# Patient Record
Sex: Female | Born: 1975 | Race: White | Hispanic: No | Marital: Single | Smoking: Current every day smoker
Health system: Southern US, Community
[De-identification: ages and names within clinical notes are randomized; demographics above are authoritative.]

## PROBLEM LIST (undated history)

## (undated) DIAGNOSIS — F1910 Other psychoactive substance abuse, uncomplicated: Secondary | ICD-10-CM

## (undated) HISTORY — PX: TUBAL LIGATION: SHX77

---

## 1997-10-07 ENCOUNTER — Emergency Department (HOSPITAL_COMMUNITY): Admission: EM | Admit: 1997-10-07 | Discharge: 1997-10-07 | Payer: Self-pay | Admitting: Emergency Medicine

## 1999-04-20 ENCOUNTER — Ambulatory Visit (HOSPITAL_COMMUNITY): Admission: RE | Admit: 1999-04-20 | Discharge: 1999-04-20 | Payer: Self-pay | Admitting: *Deleted

## 1999-05-03 ENCOUNTER — Emergency Department (HOSPITAL_COMMUNITY): Admission: EM | Admit: 1999-05-03 | Discharge: 1999-05-03 | Payer: Self-pay | Admitting: Emergency Medicine

## 1999-06-26 ENCOUNTER — Ambulatory Visit (HOSPITAL_COMMUNITY): Admission: RE | Admit: 1999-06-26 | Discharge: 1999-06-26 | Payer: Self-pay | Admitting: *Deleted

## 1999-07-22 ENCOUNTER — Emergency Department (HOSPITAL_COMMUNITY): Admission: EM | Admit: 1999-07-22 | Discharge: 1999-07-22 | Payer: Self-pay | Admitting: Emergency Medicine

## 1999-10-16 ENCOUNTER — Inpatient Hospital Stay (HOSPITAL_COMMUNITY): Admission: AD | Admit: 1999-10-16 | Discharge: 1999-10-16 | Payer: Self-pay | Admitting: Obstetrics & Gynecology

## 1999-11-16 ENCOUNTER — Inpatient Hospital Stay (HOSPITAL_COMMUNITY): Admission: AD | Admit: 1999-11-16 | Discharge: 1999-11-16 | Payer: Self-pay | Admitting: *Deleted

## 1999-11-21 ENCOUNTER — Inpatient Hospital Stay (HOSPITAL_COMMUNITY): Admission: AD | Admit: 1999-11-21 | Discharge: 1999-11-23 | Payer: Self-pay | Admitting: *Deleted

## 2001-04-17 ENCOUNTER — Emergency Department (HOSPITAL_COMMUNITY): Admission: EM | Admit: 2001-04-17 | Discharge: 2001-04-17 | Payer: Self-pay | Admitting: Emergency Medicine

## 2003-01-10 ENCOUNTER — Emergency Department (HOSPITAL_COMMUNITY): Admission: EM | Admit: 2003-01-10 | Discharge: 2003-01-10 | Payer: Self-pay | Admitting: Emergency Medicine

## 2003-01-10 ENCOUNTER — Encounter: Payer: Self-pay | Admitting: Emergency Medicine

## 2004-05-18 ENCOUNTER — Emergency Department: Payer: Self-pay | Admitting: Emergency Medicine

## 2006-02-04 ENCOUNTER — Emergency Department (HOSPITAL_COMMUNITY): Admission: EM | Admit: 2006-02-04 | Discharge: 2006-02-04 | Payer: Self-pay | Admitting: Emergency Medicine

## 2006-02-28 ENCOUNTER — Emergency Department (HOSPITAL_COMMUNITY): Admission: EM | Admit: 2006-02-28 | Discharge: 2006-02-28 | Payer: Self-pay | Admitting: Emergency Medicine

## 2007-04-27 ENCOUNTER — Emergency Department (HOSPITAL_COMMUNITY): Admission: EM | Admit: 2007-04-27 | Discharge: 2007-04-27 | Payer: Self-pay | Admitting: Family Medicine

## 2009-08-14 ENCOUNTER — Emergency Department (HOSPITAL_COMMUNITY): Admission: EM | Admit: 2009-08-14 | Discharge: 2009-08-14 | Payer: Self-pay | Admitting: Emergency Medicine

## 2010-03-17 ENCOUNTER — Emergency Department (HOSPITAL_COMMUNITY)
Admission: EM | Admit: 2010-03-17 | Discharge: 2010-03-17 | Payer: Self-pay | Source: Home / Self Care | Admitting: Emergency Medicine

## 2010-08-19 ENCOUNTER — Emergency Department (HOSPITAL_COMMUNITY)
Admission: EM | Admit: 2010-08-19 | Discharge: 2010-08-19 | Disposition: A | Discharge status: Home / Self Care | Payer: Self-pay | Attending: Emergency Medicine | Admitting: Emergency Medicine

## 2010-08-19 DIAGNOSIS — K089 Disorder of teeth and supporting structures, unspecified: Secondary | ICD-10-CM | POA: Insufficient documentation

## 2010-08-19 DIAGNOSIS — K047 Periapical abscess without sinus: Secondary | ICD-10-CM | POA: Insufficient documentation

## 2010-08-30 LAB — URINALYSIS, ROUTINE W REFLEX MICROSCOPIC
Bilirubin Urine: NEGATIVE
Glucose, UA: NEGATIVE mg/dL
Ketones, ur: NEGATIVE mg/dL
Nitrite: POSITIVE — AB
Protein, ur: 30 mg/dL — AB
Specific Gravity, Urine: 1.01 (ref 1.005–1.030)
Urobilinogen, UA: 0.2 mg/dL (ref 0.0–1.0)
pH: 7 (ref 5.0–8.0)

## 2010-08-30 LAB — DIFFERENTIAL
Basophils Absolute: 0 10*3/uL (ref 0.0–0.1)
Basophils Relative: 0 % (ref 0–1)
Lymphocytes Relative: 10 % — ABNORMAL LOW (ref 12–46)
Monocytes Absolute: 1.2 10*3/uL — ABNORMAL HIGH (ref 0.1–1.0)
Neutro Abs: 8.7 10*3/uL — ABNORMAL HIGH (ref 1.7–7.7)
Neutrophils Relative %: 79 % — ABNORMAL HIGH (ref 43–77)

## 2010-08-30 LAB — URINE CULTURE: Colony Count: >=100000

## 2010-08-30 LAB — POCT I-STAT, CHEM 8
BUN: 11 mg/dL (ref 6–23)
Calcium, Ion: 1.07 mmol/L — ABNORMAL LOW (ref 1.12–1.32)
Chloride: 104 mEq/L (ref 96–112)
HCT: 38 % (ref 36.0–46.0)
Potassium: 4 mEq/L (ref 3.5–5.1)
Sodium: 135 mEq/L (ref 135–145)

## 2010-08-30 LAB — URINE MICROSCOPIC-ADD ON

## 2010-08-30 LAB — CBC
Hemoglobin: 12.7 g/dL (ref 12.0–15.0)
MCHC: 32.7 g/dL (ref 30.0–36.0)
RDW: 12.6 % (ref 11.5–15.5)

## 2010-08-30 LAB — POCT PREGNANCY, URINE: Preg Test, Ur: NEGATIVE

## 2010-10-23 NOTE — Op Note (Signed)
[facility]  Patient:    Hailey Perez, Hailey Perez                       MRN: [id number] Proc. Date: 11/22/99 Adm. Date:  [id number] Disc. Date: [id number] Attending:  Michaelle Copas                           Operative Report  PREOPERATIVE DIAGNOSIS:       Desires sterilization.  POSTOPERATIVE DIAGNOSIS:      Desires sterilization.  OPERATION:                    Bilateral partial salpingectomy (Pomeroy technique).  SURGEON:                      Charles A. Clearance Coots, M.D.  ASSISTANT:  ANESTHESIA:                   General anesthesia.  ESTIMATED BLOOD LOSS:         Negligible.  COMPLICATIONS:                None.  SPECIMEN:                     Approximately 2 cm segments of right and left fallopian tubes.  DESCRIPTION OF PROCEDURE:     The patient was brought to the operating room and  after satisfactory general endotracheal anesthesia, the abdomen was prepped and  draped in the usual sterile fashion.  A small inferior umbilical incision was made with the scalpel that was deepened down to the fascia with curved Mayo scissors. The fascia was grasped in the midline with Kelly forceps and was cut transversely with curved Mayo scissors.  The incision was extended to the left and to the right with curved Mayo scissors.  The peritoneum was grasped with hemostats and was incised with Metzenbaum scissors.  The right angle retractors were placed in the incision and the right fallopian tube was identified and was grasped with a Babcock clamp.  The tube was followed from the cornual end to the fimbrial end serially in the grasp of Babcock clamps and then the tube was followed in a retrograde fashion back to the isthmic area of the tube in the Babcock clamps and a knuckle of tube beneath the Babcock clamp in the isthmic area of the tube was doubly ligated with #1 plain catgut and the section of tube above the knot was excised  with Metzenbaum scissors and submitted to pathology for evaluation.  There was no active bleeding from the tubal stumps.  The same procedure was performed on the opposite side without complications.  The abdomen was then closed as follows.  The peritoneum and fascia were closed as one with a continuous suture of 2-0 Vicryl.  The subcutaneous tissue was approximated with an interrupted suture of 2-0 Vicryl.  The skin was  closed with a continuous subcuticular suture of 4-0 Monocryl.  A sterile bandage was applied to the incision closure.  The surgical technician indicated that all sponge, needle, and instrument counts were correct.  The patient tolerated the procedure well and was transported to the recovery room in satisfactory condition. DD:  11/22/99 TD:  11/24/99 Job: [id number]

## 2011-03-16 LAB — RAPID URINE DRUG SCREEN, HOSP PERFORMED
Barbiturates: NOT DETECTED
Benzodiazepines: POSITIVE — AB
Cocaine: POSITIVE — AB
Opiates: NOT DETECTED

## 2012-11-16 ENCOUNTER — Emergency Department (HOSPITAL_COMMUNITY): Admission: EM | Admit: 2012-11-16 | Discharge: 2012-11-16 | Discharge status: Against Medical Advice | Payer: Self-pay

## 2012-11-16 NOTE — ED Notes (Signed)
Called pt to triage. No response.  

## 2012-11-16 NOTE — ED Notes (Signed)
Called pt name. No answer x 2

## 2013-12-01 ENCOUNTER — Emergency Department (HOSPITAL_COMMUNITY)
Admission: EM | Admit: 2013-12-01 | Discharge: 2013-12-01 | Disposition: A | Discharge status: Home / Self Care | Payer: Self-pay | Attending: Emergency Medicine | Admitting: Emergency Medicine

## 2013-12-01 ENCOUNTER — Encounter (HOSPITAL_COMMUNITY): Payer: Self-pay | Admitting: Emergency Medicine

## 2013-12-01 DIAGNOSIS — L0231 Cutaneous abscess of buttock: Secondary | ICD-10-CM | POA: Insufficient documentation

## 2013-12-01 DIAGNOSIS — F17200 Nicotine dependence, unspecified, uncomplicated: Secondary | ICD-10-CM | POA: Insufficient documentation

## 2013-12-01 DIAGNOSIS — L03317 Cellulitis of buttock: Principal | ICD-10-CM

## 2013-12-01 DIAGNOSIS — Z79899 Other long term (current) drug therapy: Secondary | ICD-10-CM | POA: Insufficient documentation

## 2013-12-01 MED ORDER — SULFAMETHOXAZOLE-TMP DS 800-160 MG PO TABS
1.0000 | ORAL_TABLET | Freq: Once | ORAL | Status: AC
  Administered 2013-12-01: 1 via ORAL
  Filled 2013-12-01: qty 1

## 2013-12-01 MED ORDER — CEPHALEXIN 500 MG PO CAPS: 500.0000 mg | ORAL_CAPSULE | Freq: Four times a day (QID) | ORAL | Status: DC

## 2013-12-01 MED ORDER — CEPHALEXIN 500 MG PO CAPS
500.0000 mg | ORAL_CAPSULE | Freq: Once | ORAL | Status: AC
  Administered 2013-12-01: 500 mg via ORAL
  Filled 2013-12-01: qty 1

## 2013-12-01 MED ORDER — TRAMADOL HCL 50 MG PO TABS: 50.0000 mg | ORAL_TABLET | Freq: Four times a day (QID) | ORAL | Status: DC | PRN

## 2013-12-01 MED ORDER — MORPHINE SULFATE 4 MG/ML IJ SOLN
4.0000 mg | INTRAMUSCULAR | Status: DC | PRN
  Administered 2013-12-01: 4 mg via INTRAVENOUS
  Filled 2013-12-01: qty 1

## 2013-12-01 MED ORDER — LIDOCAINE-EPINEPHRINE 2 %-1:100000 IJ SOLN
20.0000 mL | Freq: Once | INTRAMUSCULAR | Status: AC
  Administered 2013-12-01: 20 mL via INTRADERMAL
  Filled 2013-12-01: qty 1

## 2013-12-01 MED ORDER — SULFAMETHOXAZOLE-TRIMETHOPRIM 800-160 MG PO TABS: 1.0000 | ORAL_TABLET | Freq: Two times a day (BID) | ORAL | Status: DC

## 2013-12-01 NOTE — ED Provider Notes (Signed)
Medical screening examination/treatment/procedure(s) were performed by non-physician practitioner and as supervising physician I was immediately available for consultation/collaboration.   Dorie Rank, MD 12/01/13 1525

## 2013-12-01 NOTE — ED Notes (Signed)
Patient c/o abscess left buttock  X 3 days.

## 2013-12-01 NOTE — ED Provider Notes (Signed)
CSN: [id number]     Arrival date & time 12/01/13  1241 History  This chart was scribed for non-physician practitioner, Monico Blitz, PA-C,working with Dorie Rank, MD, by Marlowe Kays, ED Scribe.  This patient was seen in room WTR6/WTR6 and the patient's care was started at 2:27 PM.  Chief Complaint  Patient presents with  . Abscess   The history is provided by the patient. No language interpreter was used.   HPI Comments:  Hailey Perez is a 38 y.o. female who presents to the Emergency Department complaining of an abscess to her left buttock that she noticed three days ago. Pt reports the area started as a "pimple" and reports trying to pop it with no discharge of anything. She denies fever, chills, nausea, or vomiting. She denies h/o DM. She states she has not been seen by a doctor "in a long time". She states she is not allergic to any medications.   History reviewed. No pertinent past medical history. Past Surgical History  Procedure Laterality Date  . Tubal ligation     Family History  Problem Relation Age of Onset  . Hypertension Mother    History  Substance Use Topics  . Smoking status: Current Every Day Smoker -- 1.00 packs/day    Types: Cigarettes  . Smokeless tobacco: Never Used  . Alcohol Use: No   OB History   Grav Para Term Preterm Abortions TAB SAB Ect Mult Living                 Review of Systems A complete 10 system review of systems was obtained and all systems are negative except as noted in the HPI and PMH.   Allergies  Review of patient's allergies indicates no known allergies.  Home Medications   Prior to Admission medications   Medication Sig Start Date End Date Taking? Authorizing Loyd Salvador  Aspirin-Salicylamide-Caffeine (BC HEADACHE POWDER PO) Take 1 packet by mouth as needed (for pain).   Yes Historical Felipe Cabell, MD  cephALEXin (KEFLEX) 500 MG capsule Take 1 capsule (500 mg total) by mouth 4 (four) times daily. 12/01/13   Nicole Pisciotta, PA-C   sulfamethoxazole-trimethoprim (SEPTRA DS) 800-160 MG per tablet Take 1 tablet by mouth every 12 (twelve) hours. 12/01/13   Nicole Pisciotta, PA-C  traMADol (ULTRAM) 50 MG tablet Take 1 tablet (50 mg total) by mouth every 6 (six) hours as needed. 12/01/13   Nicole Pisciotta, PA-C   Triage Vitals: BP 105/59  Pulse 90  Temp(Src) 98.4 F (36.9 C) (Oral)  Resp 16  Ht 5' (1.524 m)  Wt 115 lb (52.164 kg)  BMI 22.46 kg/m2  SpO2 99%  LMP 11/29/2013 Physical Exam  Nursing note and vitals reviewed. Constitutional: She is oriented to person, place, and time. She appears well-developed and well-nourished.  HENT:  Head: Normocephalic and atraumatic.  Eyes: EOM are normal.  Neck: Normal range of motion.  Cardiovascular: Normal rate.   Pulmonary/Chest: Effort normal.  Musculoskeletal: Normal range of motion.  Neurological: She is alert and oriented to person, place, and time.  Skin: Skin is warm and dry.     5 cm indurated abscess with no fluctuance with central scab.  Psychiatric: She has a normal mood and affect. Her behavior is normal.    ED Course  Procedures (including critical care time) DIAGNOSTIC STUDIES: Oxygen Saturation is 99% on RA, normal by my interpretation.   COORDINATION OF CARE: 2:31 PM- Will I and D abscess and order pain medication prior. Will prescribe antibiotics.  Pt verbalizes understanding and agrees to plan.  INCISION AND DRAINAGE PROCEDURE NOTE: Patient identification was confirmed and verbal consent was obtained. This procedure was performed by Monico Blitz at 3:10 PM. Site: left buttock Sterile procedures observed Needle size: 25 G Anesthetic used (type and amt): Lidocaine 2% with Epinephrine (3 mL) Blade size: #11 Drainage: minimal amount of blood Complexity: Complex Packing used: none Site anesthetized, incision made over site, wound drained and explored loculations, rinsed with copious amounts of normal saline, wound packed with sterile gauze,  covered with dry, sterile dressing.  Pt tolerated procedure well without complications.  Instructions for care discussed verbally and pt provided with additional written instructions for homecare and f/u.   Medications  morphine 4 MG/ML injection 4 mg (4 mg Intravenous Given 12/01/13 1441)  sulfamethoxazole-trimethoprim (BACTRIM DS) 800-160 MG per tablet 1 tablet (not administered)  cephALEXin (KEFLEX) capsule 500 mg (not administered)  lidocaine-EPINEPHrine (XYLOCAINE W/EPI) 2 %-1:100000 (with pres) injection 20 mL (20 mLs Intradermal Given 12/01/13 1441)    Labs Review Labs Reviewed - No data to display  Imaging Review No results found.   EKG Interpretation None      MDM   Final diagnoses:  Abscess, gluteal, left    Filed Vitals:   12/01/13 1419  BP: 105/59  Pulse: 90  Temp: 98.4 F (36.9 C)  TempSrc: Oral  Resp: 16  Height: 5' (1.524 m)  Weight: 115 lb (52.164 kg)  SpO2: 99%    Medications  morphine 4 MG/ML injection 4 mg (4 mg Intravenous Given 12/01/13 1441)  sulfamethoxazole-trimethoprim (BACTRIM DS) 800-160 MG per tablet 1 tablet (not administered)  cephALEXin (KEFLEX) capsule 500 mg (not administered)  lidocaine-EPINEPHrine (XYLOCAINE W/EPI) 2 %-1:100000 (with pres) injection 20 mL (20 mLs Intradermal Given 12/01/13 1441)    Hailey Perez is a 38 y.o. female presenting with gluteal abscess to left buttock onset 3 days ago. No systemic signs of infection. Incision and drainage with scant bloody discharge. She will be started on antibiotics  Evaluation does not show pathology that would require ongoing emergent intervention or inpatient treatment. Pt is hemodynamically stable and mentating appropriately. Discussed findings and plan with patient/guardian, who agrees with care plan. All questions answered. Return precautions discussed and outpatient follow up given.   New Prescriptions   CEPHALEXIN (KEFLEX) 500 MG CAPSULE    Take 1 capsule (500 mg total) by  mouth 4 (four) times daily.   SULFAMETHOXAZOLE-TRIMETHOPRIM (SEPTRA DS) 800-160 MG PER TABLET    Take 1 tablet by mouth every 12 (twelve) hours.   TRAMADOL (ULTRAM) 50 MG TABLET    Take 1 tablet (50 mg total) by mouth every 6 (six) hours as needed.        I personally performed the services described in this documentation, which was scribed in my presence. The recorded information has been reviewed and is accurate.    Monico Blitz, PA-C 12/01/13 [phone]

## 2013-12-01 NOTE — ED Notes (Signed)
Per patient-A&O x4. Abscess on left buttock x3 days. No fevers, chills, vomiting or diarrhea. Drainage noted as clear. No other issues noted. In NAD.

## 2013-12-01 NOTE — Discharge Instructions (Signed)
°  Take your antibiotics as directed and to completion. You should never have any leftover antibiotics! Push fluids and stay well hydrated.   If you see signs of infection (warmth, redness, tenderness, pus, sharp increase in pain, fever, red streaking) immediately return to the emergency department.  For pain control you may take:  800mg  of ibuprofen (that is usually 4 over the counter pills)  3 times a day (take with food) and acetaminophen 975mg  (this is 3 over the counter pills) four times a day. Do not drink alcohol or combine with other medications that have acetaminophen as an ingredient (Read the labels!).  For breakthrough pain you may take Tramadol. Do not drink alcohol drive or operate heavy machinery when taking Tramadol.   Do not hesitate to return to the Emergency Department for any new, worsening or concerning symptoms.   If you do not have a primary care doctor you can establish one at the   [facility]: [place] [address] [phone]  After you establish care. Let them know you were seen in the emergency room. They must obtain records for further management.    Abscess Care After An abscess (also called a boil or furuncle) is an infected area that contains a collection of pus. Signs and symptoms of an abscess include pain, tenderness, redness, or hardness, or you may feel a moveable soft area under your skin. An abscess can occur anywhere in the body. The infection may spread to surrounding tissues causing cellulitis. A cut (incision) by the surgeon was made over your abscess and the pus was drained out. Gauze may have been packed into the space to provide a drain that will allow the cavity to heal from the inside outwards. The boil may be painful for 5 to 7 days. Most people with a boil do not have high fevers. Your abscess, if seen early, may not have localized, and may not have been lanced. If not, another appointment may be required for this  if it does not get better on its own or with medications. HOME CARE INSTRUCTIONS   Only take over-the-counter or prescription medicines for pain, discomfort, or fever as directed by your caregiver.  When you bathe, soak and then remove gauze or iodoform packs at least daily or as directed by your caregiver. You may then wash the wound gently with mild soapy water. Repack with gauze or do as your caregiver directs. SEEK IMMEDIATE MEDICAL CARE IF:   You develop increased pain, swelling, redness, drainage, or bleeding in the wound site.  You develop signs of generalized infection including muscle aches, chills, fever, or a general ill feeling.  An oral temperature above 102 F (38.9 C) develops, not controlled by medication. See your caregiver for a recheck if you develop any of the symptoms described above. If medications (antibiotics) were prescribed, take them as directed. Document Released: 12/10/2004 Document Revised: 08/16/2011 Document Reviewed: 08/07/2007 [facility] Patient Information 2015 [place], [place]. This information is not intended to replace advice given to you by your health care provider. Make sure you discuss any questions you have with your health care provider.

## 2014-03-25 ENCOUNTER — Encounter (HOSPITAL_COMMUNITY): Payer: Self-pay | Admitting: Emergency Medicine

## 2014-03-25 ENCOUNTER — Emergency Department (HOSPITAL_COMMUNITY)
Admission: EM | Admit: 2014-03-25 | Discharge: 2014-03-26 | Disposition: A | Discharge status: Home / Self Care | Payer: Self-pay | Attending: Emergency Medicine | Admitting: Emergency Medicine

## 2014-03-25 DIAGNOSIS — F32A Depression, unspecified: Secondary | ICD-10-CM

## 2014-03-25 DIAGNOSIS — F1410 Cocaine abuse, uncomplicated: Secondary | ICD-10-CM | POA: Insufficient documentation

## 2014-03-25 DIAGNOSIS — F1110 Opioid abuse, uncomplicated: Secondary | ICD-10-CM | POA: Insufficient documentation

## 2014-03-25 DIAGNOSIS — F1910 Other psychoactive substance abuse, uncomplicated: Secondary | ICD-10-CM

## 2014-03-25 DIAGNOSIS — F329 Major depressive disorder, single episode, unspecified: Secondary | ICD-10-CM | POA: Insufficient documentation

## 2014-03-25 DIAGNOSIS — F1310 Sedative, hypnotic or anxiolytic abuse, uncomplicated: Secondary | ICD-10-CM | POA: Insufficient documentation

## 2014-03-25 DIAGNOSIS — F1210 Cannabis abuse, uncomplicated: Secondary | ICD-10-CM | POA: Insufficient documentation

## 2014-03-25 DIAGNOSIS — Z720 Tobacco use: Secondary | ICD-10-CM | POA: Insufficient documentation

## 2014-03-25 DIAGNOSIS — Z3202 Encounter for pregnancy test, result negative: Secondary | ICD-10-CM | POA: Insufficient documentation

## 2014-03-25 HISTORY — DX: Other psychoactive substance abuse, uncomplicated: F19.10

## 2014-03-25 NOTE — ED Notes (Signed)
Patient asking to go out to smoke, informed her that I needed her to stay in sub waiting room. Informed her if she left, and she was called , that she could be discharged, left before being seen.

## 2014-03-25 NOTE — ED Notes (Signed)
Have an exame room for patient, patient not in sub-waiting room.

## 2014-03-25 NOTE — ED Notes (Signed)
Moved another patient to fast track, when I returned to sub waiting room, patient had left.

## 2014-03-25 NOTE — ED Notes (Signed)
PT requesting an inpt detox center.

## 2014-03-25 NOTE — ED Notes (Signed)
Patient just returned to sub-waiting room.

## 2014-03-25 NOTE — ED Notes (Addendum)
Pt requesting detox from heroin, crack, cocaine. Last used heroin three hours ago. Pt is arguing with boyfriend in triage. Pt denies SI/HI

## 2014-03-25 NOTE — ED Provider Notes (Signed)
CSN: [id number]     Arrival date & time 03/25/14  2121 History   First MD Initiated Contact with Patient 03/25/14 2325     Chief Complaint  Patient presents with  . Addiction Problem     (Consider location/radiation/quality/duration/timing/severity/associated sxs/prior Treatment) The history is provided by the patient, a relative and a friend. No language interpreter was used.  KALIOPI Perez is a 66 rolled female with past medical history of substance abuse presenting to the ED requesting detox. 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 Reported that she would like detox from heroine. Stated that she's been using heroin for approximately one year, reported that she normally uses 2 times per day-reported that her last use was at 5:00 PM this afternoon. Patient reported that she is also addicted to cocaine, snorts cocaine. Stated that she last used cocaine this morning. Patient reported that she's been having increased depression with decreased sleep. Stated that she has not slept in approximately 2 days. Reported occasional marijuana use. Denied alcohol use. Denied chest pain, shortness of breath, difficulty breathing, cough, nausea, vomiting, diarrhea, fever, chills, suicidal ideation, homicidal ideation, changes eating habits, auditory visual hallucinations. PCP none  Past Medical History  Diagnosis Date  . Substance abuse    Past Surgical History  Procedure Laterality Date  . Tubal ligation    . Tubal ligation     Family History  Problem Relation Age of Onset  . Hypertension Mother    History  Substance Use Topics  . Smoking status: Current Every Day Smoker -- 1.00 packs/day    Types: Cigarettes  . Smokeless tobacco: Never Used  . Alcohol Use: No   OB History   Grav Para Term Preterm Abortions TAB SAB Ect Mult Living                 Review of Systems  Constitutional: Negative for fever and chills.  Respiratory: Negative for chest tightness and shortness  of breath.   Cardiovascular: Negative for chest pain.  Gastrointestinal: Negative for nausea, vomiting, abdominal pain and diarrhea.  Musculoskeletal: Negative for back pain and neck pain.  Neurological: Negative for dizziness, weakness and headaches.  Psychiatric/Behavioral: Positive for dysphoric mood.      Allergies  Review of patient's allergies indicates no known allergies.  Home Medications   Prior to Admission medications   Medication Sig Start Date End Date Taking? Authorizing Provider  Aspirin-Salicylamide-Caffeine (BC HEADACHE POWDER PO) Take 1 packet by mouth as needed (for pain).   Yes Historical Provider, MD   BP 118/76  Pulse 78  Temp(Src) 98.1 F (36.7 C) (Oral)  Resp 17  Ht 5' (1.524 m)  Wt 90 lb (40.824 kg)  BMI 17.58 kg/m2  SpO2 100%  LMP 02/06/2014 Physical Exam  Nursing note and vitals reviewed. Constitutional: She is oriented to person, place, and time. She appears well-developed and well-nourished.  Patient extremely lethargic. Patient cannot keep eyes open, continuously needing to yell patient's name or shake patient's leg to wake her up.  HENT:  Head: Normocephalic and atraumatic.  Mouth/Throat: Oropharynx is clear and moist. No oropharyngeal exudate.  Eyes: Conjunctivae and EOM are normal. Pupils are equal, round, and reactive to light. Right eye exhibits no discharge. Left eye exhibits no discharge.  Neck: Normal range of motion. Neck supple. No tracheal deviation present.  Cardiovascular: Normal rate, regular rhythm and normal heart sounds.   Pulses:      Radial pulses are 2+ on the right side, and  2+ on the left side.       Dorsalis pedis pulses are 2+ on the right side, and 2+ on the left side.  Pulmonary/Chest: Effort normal and breath sounds normal. No respiratory distress. She has no wheezes. She has no rales.  Airway intact Patient breathing without any respiratory distress  Abdominal: Soft. Bowel sounds are normal. She exhibits no  distension. There is no tenderness. There is no rebound and no guarding.  Negative abdominal distention Bowel sounds normoactive in all 4 quadrants Abdomen soft upon palpation Negative peritoneal signs Negative rigidity or guarding noted on exam  Musculoskeletal: Normal range of motion. She exhibits no edema and no tenderness.  Lymphadenopathy:    She has no cervical adenopathy.  Neurological: She is alert and oriented to person, place, and time. No cranial nerve deficit. She exhibits normal muscle tone. Coordination normal.  Patient continues to fall sleep during interview-patient easily awoken by calling her name or shaking her leg Patient follows commands Patient response to questions appropriately when awake  Skin: Skin is warm. No rash noted. No erythema.    ED Course  Procedures (including critical care time)  Results for orders placed during the hospital encounter of 03/25/14  CBC WITH DIFFERENTIAL      Result Value Ref Range   WBC 6.2  4.0 - 10.5 K/uL   RBC 4.40  3.87 - 5.11 MIL/uL   Hemoglobin 12.8  12.0 - 15.0 g/dL   HCT 38.1  36.0 - 46.0 %   MCV 86.6  78.0 - 100.0 fL   MCH 29.1  26.0 - 34.0 pg   MCHC 33.6  30.0 - 36.0 g/dL   RDW 12.9  11.5 - 15.5 %   Platelets 216  150 - 400 K/uL   Neutrophils Relative % 44  43 - 77 %   Neutro Abs 2.8  1.7 - 7.7 K/uL   Lymphocytes Relative 43  12 - 46 %   Lymphs Abs 2.7  0.7 - 4.0 K/uL   Monocytes Relative 10  3 - 12 %   Monocytes Absolute 0.6  0.1 - 1.0 K/uL   Eosinophils Relative 2  0 - 5 %   Eosinophils Absolute 0.1  0.0 - 0.7 K/uL   Basophils Relative 1  0 - 1 %   Basophils Absolute 0.0  0.0 - 0.1 K/uL  COMPREHENSIVE METABOLIC PANEL      Result Value Ref Range   Sodium 138  137 - 147 mEq/L   Potassium 3.8  3.7 - 5.3 mEq/L   Chloride 101  96 - 112 mEq/L   CO2 24  19 - 32 mEq/L   Glucose, Bld 103 (*) 70 - 99 mg/dL   BUN 16  6 - 23 mg/dL   Creatinine, Ser 0.73  0.50 - 1.10 mg/dL   Calcium 9.0  8.4 - 10.5 mg/dL   Total  Protein 6.9  6.0 - 8.3 g/dL   Albumin 3.3 (*) 3.5 - 5.2 g/dL   AST 119 (*) 0 - 37 U/L   ALT 202 (*) 0 - 35 U/L   Alkaline Phosphatase 96  39 - 117 U/L   Total Bilirubin <0.2 (*) 0.3 - 1.2 mg/dL   GFR calc non Af Amer >90  >90 mL/min   GFR calc Af Amer >90  >90 mL/min   Anion gap 13  5 - 15  ACETAMINOPHEN LEVEL      Result Value Ref Range   Acetaminophen (Tylenol), Serum <15.0  10 - 30  ug/mL  SALICYLATE LEVEL      Result Value Ref Range   Salicylate Lvl <4.0 (*) 2.8 - 20.0 mg/dL  ETHANOL      Result Value Ref Range   Alcohol, Ethyl (B) <11  0 - 11 mg/dL  TROPONIN I      Result Value Ref Range   Troponin I <0.30  <0.30 ng/mL  I-STAT ARTERIAL BLOOD GAS, ED      Result Value Ref Range   pH, Arterial 7.366  7.350 - 7.450   pCO2 arterial 46.3 (*) 35.0 - 45.0 mmHg   pO2, Arterial 85.0  80.0 - 100.0 mmHg   Bicarbonate 26.5 (*) 20.0 - 24.0 mEq/L   TCO2 28  0 - 100 mmol/L   O2 Saturation 96.0     Acid-Base Excess 1.0  0.0 - 2.0 mmol/L   Patient temperature 98.6 F     Collection site RADIAL, ALLEN'S TEST ACCEPTABLE     Drawn by RT     Sample type ARTERIAL      Labs Review Labs Reviewed  COMPREHENSIVE METABOLIC PANEL - Abnormal; Notable for the following:    Glucose, Bld 103 (*)    Albumin 3.3 (*)    AST 119 (*)    ALT 202 (*)    Total Bilirubin <0.2 (*)    All other components within normal limits  SALICYLATE LEVEL - Abnormal; Notable for the following:    Salicylate Lvl <9.7 (*)    All other components within normal limits  I-STAT ARTERIAL BLOOD GAS, ED - Abnormal; Notable for the following:    pCO2 arterial 46.3 (*)    Bicarbonate 26.5 (*)    All other components within normal limits  CBC WITH DIFFERENTIAL  ACETAMINOPHEN LEVEL  ETHANOL  TROPONIN I  URINE RAPID DRUG SCREEN (HOSP PERFORMED)  BLOOD GAS, ARTERIAL  POC URINE PREG, ED    Imaging Review No results found.   EKG Interpretation None     12:16 AM This provider went to reassess the patient. Mother and  boyfriend at bedside. Discussed outpatient detox. Mother became concerned, reported that patient is been having increased depression and has been voicing suicidal ideation. Reported that she attempted suicide approximately 2 months ago by cutting her hands. Boyfriend is concerned secondary to patient continuously using medications and believes that she will overdose one day. Mother and boyfriend extremely concerned. Mother reported the patient is been voicing that she's been having increased depression the past couple of days. Noticed changes to behavior.  12:54 AM Spoke with Dr. Suzie Portela regarding case transfer of care to Dr. Suzie Portela at change in shift.   MDM   Final diagnoses:  Depression  Suicidal ideation  Heroin abuse  Cocaine abuse  Polysubstance abuse    Medications  ondansetron (ZOFRAN) tablet 4 mg (not administered)   Filed Vitals:   03/25/14 2142 03/26/14 0053  BP: 118/76   Pulse: 85 78  Temp: 98.1 F (36.7 C)   TempSrc: Oral   Resp: 16 17  Height: 5' (1.524 m)   Weight: 90 lb (40.824 kg)   SpO2: 100%     Initially patient was presenting to the ED with detox from cocaine and heroin, but when family was at bedside reported that patient has been having increased depression and voicing suicidal ideation. Family extremely concerned. Patient appears extremely lethargic-cannot keep eyes open during exam. Patient is slurring speech. Stated that she recently used heroin and cocaine prior to arrival to the ED. EKG normal  sinus rhythm with a heart rate of 76 beats per minute. Troponin negative elevation. CBC unremarkable. CMP noted elevated AST of 119, ALT of 202. Negative elevated bilirubin. Glucose 103. Negative elevated anion gap of 13.0 mEq per liter. Ethanol, acetaminophen, salicylate level negative elevation. ABG noted elevated the CO2 of 46.3 and elevated bicarbonate of 26.5. Psych holding placed. Discussed case in great detail with Dr. Suzie Portela. Patient to stay in the ED until  sobers up and to be re-assessed in the morning.   Jamse Mead, PA-C 03/26/14 [phone]

## 2014-03-26 LAB — CBC WITH DIFFERENTIAL/PLATELET
BASOS PCT: 1 % (ref 0–1)
Basophils Absolute: 0 10*3/uL (ref 0.0–0.1)
Eosinophils Absolute: 0.1 10*3/uL (ref 0.0–0.7)
Eosinophils Relative: 2 % (ref 0–5)
HCT: 38.1 % (ref 36.0–46.0)
HEMOGLOBIN: 12.8 g/dL (ref 12.0–15.0)
LYMPHS ABS: 2.7 10*3/uL (ref 0.7–4.0)
Lymphocytes Relative: 43 % (ref 12–46)
MCH: 29.1 pg (ref 26.0–34.0)
MCHC: 33.6 g/dL (ref 30.0–36.0)
MCV: 86.6 fL (ref 78.0–100.0)
MONOS PCT: 10 % (ref 3–12)
Monocytes Absolute: 0.6 10*3/uL (ref 0.1–1.0)
NEUTROS ABS: 2.8 10*3/uL (ref 1.7–7.7)
NEUTROS PCT: 44 % (ref 43–77)
Platelets: 216 10*3/uL (ref 150–400)
RBC: 4.4 MIL/uL (ref 3.87–5.11)
RDW: 12.9 % (ref 11.5–15.5)
WBC: 6.2 10*3/uL (ref 4.0–10.5)

## 2014-03-26 LAB — RAPID URINE DRUG SCREEN, HOSP PERFORMED
AMPHETAMINES: NOT DETECTED
BENZODIAZEPINES: POSITIVE — AB
Barbiturates: NOT DETECTED
COCAINE: POSITIVE — AB
OPIATES: POSITIVE — AB
Tetrahydrocannabinol: POSITIVE — AB

## 2014-03-26 LAB — COMPREHENSIVE METABOLIC PANEL
ALBUMIN: 3.3 g/dL — AB (ref 3.5–5.2)
ALK PHOS: 96 U/L (ref 39–117)
ALT: 202 U/L — AB (ref 0–35)
ANION GAP: 13 (ref 5–15)
AST: 119 U/L — ABNORMAL HIGH (ref 0–37)
BUN: 16 mg/dL (ref 6–23)
CHLORIDE: 101 meq/L (ref 96–112)
CO2: 24 mEq/L (ref 19–32)
Calcium: 9 mg/dL (ref 8.4–10.5)
Creatinine, Ser: 0.73 mg/dL (ref 0.50–1.10)
GFR calc Af Amer: >90 mL/min (ref >90)
GFR calc non Af Amer: >90 mL/min (ref >90)
Glucose, Bld: 103 mg/dL — ABNORMAL HIGH (ref 70–99)
POTASSIUM: 3.8 meq/L (ref 3.7–5.3)
Sodium: 138 mEq/L (ref 137–147)
Total Protein: 6.9 g/dL (ref 6.0–8.3)

## 2014-03-26 LAB — I-STAT ARTERIAL BLOOD GAS, ED
Acid-Base Excess: 1 mmol/L (ref 0.0–2.0)
Bicarbonate: 26.5 mEq/L — ABNORMAL HIGH (ref 20.0–24.0)
O2 Saturation: 96 %
TCO2: 28 mmol/L (ref 0–100)
pCO2 arterial: 46.3 mmHg — ABNORMAL HIGH (ref 35.0–45.0)
pH, Arterial: 7.366 (ref 7.350–7.450)
pO2, Arterial: 85 mmHg (ref 80.0–100.0)

## 2014-03-26 LAB — TROPONIN I
Troponin I: <0.3 ng/mL (ref <0.30)
Troponin I: <0.3 ng/mL (ref <0.30)

## 2014-03-26 LAB — ETHANOL: Alcohol, Ethyl (B): <11 mg/dL (ref 0–11)

## 2014-03-26 LAB — ACETAMINOPHEN LEVEL: Acetaminophen (Tylenol), Serum: <15 ug/mL (ref 10–30)

## 2014-03-26 LAB — POC URINE PREG, ED: PREG TEST UR: NEGATIVE

## 2014-03-26 LAB — SALICYLATE LEVEL: Salicylate Lvl: <2 mg/dL — ABNORMAL LOW (ref 2.8–20.0)

## 2014-03-26 MED ORDER — ONDANSETRON HCL 4 MG PO TABS
4.0000 mg | ORAL_TABLET | Freq: Three times a day (TID) | ORAL | Status: DC | PRN
Start: 2014-03-26 — End: 2014-03-26

## 2014-03-26 MED ORDER — IBUPROFEN 400 MG PO TABS: 400.0000 mg | ORAL_TABLET | Freq: Four times a day (QID) | ORAL | Status: DC | PRN

## 2014-03-26 MED ORDER — ACETAMINOPHEN 325 MG PO TABS: 650.0000 mg | ORAL_TABLET | Freq: Four times a day (QID) | ORAL | Status: DC | PRN

## 2014-03-26 MED ORDER — PROMETHAZINE HCL 25 MG PO TABS: 25.0000 mg | ORAL_TABLET | Freq: Four times a day (QID) | ORAL | Status: DC | PRN

## 2014-03-26 MED ORDER — PROMETHAZINE HCL 25 MG RE SUPP: 25.0000 mg | Freq: Four times a day (QID) | RECTAL | Status: DC | PRN

## 2014-03-26 NOTE — ED Notes (Addendum)
Staffing office called - states they will send sitter. ED tech sitting with pt in meantime.

## 2014-03-26 NOTE — ED Notes (Signed)
Pt remains monitored by blood pressure, pulse ox, and 5 lead. Dr. Kathrynn Humble at bedside.

## 2014-03-26 NOTE — ED Notes (Signed)
Per [place] Pt now reports being SI and depressed. Family in room with PT.
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
## 2014-03-26 NOTE — Discharge Instructions (Signed)
We saw you in the ER for your mental health concerns and had our behavioral health team evaluate you. The team feels comfortable sending you home, please follow the recommendations given to you by them and the follow-up and medications they have prescribed to you. Please refrain from substance abuse. Return to the ER if your symptoms worsen.    Emergency Department Resource Guide 1) Find a Doctor and Pay Out of Pocket Although you won't have to find out who is covered by your insurance plan, it is a good idea to ask around and get recommendations. You will then need to call the office and see if the doctor you have chosen will accept you as a new patient and what types of options they offer for patients who are self-pay. Some doctors offer discounts or will set up payment plans for their patients who do not have insurance, but you will need to ask so you aren't surprised when you get to your appointment.  2) Contact Your Local Health Department Not all health departments have doctors that can see patients for sick visits, but many do, so it is worth a call to see if yours does. If you don't know where your local health department is, you can check in your phone book. The CDC also has a tool to help you locate your state's health department, and many state websites also have listings of all of their local health departments.  3) Find a [facility] If your illness is not likely to be very severe or complicated, you may want to try a walk in clinic. These are popping up all over the country in pharmacies, drugstores, and shopping centers. They're usually staffed by nurse practitioners or physician assistants that have been trained to treat common illnesses and complaints. They're usually fairly quick and inexpensive. However, if you have serious medical issues or chronic medical problems, these are probably not your best option.  No Primary Care Doctor: - Call Health Connect at  [phone] - they can  help you locate a primary care doctor that  accepts your insurance, provides certain services, etc. - Physician Referral Service- [phone]  Chronic Pain Problems: Organization         Address  Phone   Notes  [facility]  [phone] Patients need to be referred by their primary care doctor.   Medication Assistance: Organization         Address  Phone   Notes  [facility] Medication [facility] [address] [place], [place] [address] [phone] --Must be a resident of [facility] -- Must have NO insurance coverage whatsoever (no Medicaid/ Medicare, etc.) -- The pt. MUST have a primary care doctor that directs their care regularly and follows them in the community   MedAssist  [phone]   [employer]  [phone]    Agencies that provide inexpensive medical care: Organization         Address  Phone   Notes  [place]  [phone]   [facility]    [phone]   [facility] [place], [place] [address] [phone]   [place] [address], [place] [phone]   Planned Parenthood    [phone]   [facility]    [phone]   [facility], [place] Phone:  [phone],  Fax:  [phone] Hours of Operation:  9 am - 6 pm, M-F.  Also accepts Medicaid/Medicare and self-pay.  [facility] for [place], [address], [place] Phone: [phone], Fax: [phone]. Hours of Operation:  8:30 am - 5:30 pm, M-F.  Also accepts Medicaid and self-pay.  [facility] [place] [address], [place] Phone: [phone]   [place], [place], [place] [phone], Ext. [phone] Mondays & Thursdays: 7-9 AM.  First 15 patients are seen on a first come, first serve basis.    [place] Providers:  Organization         Address  Phone   Notes  [facility] [address], [place] [phone] Also accepts self-pay patients.  [facility] [address], [place]  [phone]   [place], [address], [place] [phone]   [facility] [address], [place] [phone]   Lucianne Lei [address], [place]   [phone] Only accepts [place] Access [place] patients after they have their name applied to their card.   Self-Pay (no insurance) in [facility]:  Organization         Address  Phone   Notes  Sickle Cell Patients, [facility] [place] [phone]   [facility] [place] [phone]   [facility] [place]  [place], [place], [place] [phone]   [facility]/Dr. Osei-Bonsu  [address], [place] or [address], [place] [phone] Phone number for both [place] and [place] locations is the same.  [facility] and [facility] [address], [place] [phone]   [facility] [address] [place] or [address] [phone]   [facility] [address] [place] [phone], phone; [phone], fax Sees patients 1st and 3rd Saturday of every month.  Must not qualify for public or private insurance (i.e. Medicaid, Medicare, [place] Health Choice, Veterans' Benefits)  Household income should be no more than 200% of the poverty level The clinic cannot treat you if you are pregnant or think you are pregnant  Sexually transmitted diseases are not treated at the clinic.    Dental Care: Organization         Address  Phone  Notes  [facility] Department of [facility] [place] [phone] Accepts children up to age 18 who are enrolled in [place] or [place]; pregnant women with a Medicaid card; and children who have applied for Medicaid or [place] Health Choice, but were declined, whose parents can pay a reduced fee at time of service.  [facility] Department of [facility]  [address], [place] [phone] Accepts children up to age 37 who are enrolled in [place] or [place]; pregnant women with a Medicaid card; and children who have applied for Medicaid or [place] Health Choice, but were declined, whose parents can pay a reduced fee at time of service.  [place] Adult Dental Access PROGRAM  [place] [phone] Patients are seen by appointment only. Walk-ins are not  accepted. [place] will see patients 45 years of age and older. Monday - Tuesday (8am-5pm) Most Wednesdays (8:30-5pm) $30 per visit, cash only  [facility] Adult Dental Access PROGRAM  [address], [facility] [phone] Patients are seen by appointment only. Walk-ins are not accepted. [place] will see patients 47 years of age and older. One Wednesday Evening (Monthly: Volunteer Based).  $30 per visit, cash only  [place]  [phone] for adults; Children under age 35, call Graduate Pediatric Dentistry at [phone]. Children aged 73-14, please call [phone] to request a pediatric application.  Dental services are provided in all areas of dental care including fillings, crowns and bridges, complete and partial dentures, implants, gum treatment, root canals, and extractions. Preventive care is also provided. Treatment is provided to both adults and children. Patients are selected via a lottery and there is often a waiting list.   [facility] [address] [place]  [phone] [email or web address]   [facility] [address] [place], [place] [phone], Ext. [phone] Second and Fourth Thursday of each month, opens at 6:30 AM; Clinic ends at 9 AM.  Patients are seen on a first-come first-served basis, and a limited number are seen during each clinic.   [facility]  [address] Hillard Danker [place], [place] [phone]   Eligibility Requirements You must have lived in [place], [place], or [place] counties for at least the last three months.   You cannot be eligible for state or federal sponsored [employer], including [employer], [place], or [employer].   You generally cannot be eligible for healthcare insurance through your employer.    How to apply: Eligibility screenings are held every Tuesday and Wednesday afternoon from 1:00 pm until 4:00 pm. You do not need an appointment for the interview!  [facility] [address], [place], [place]   [place]  [place] Department  [place]  [phone]    Behavioral Health Resources in the Community: Intensive Outpatient Programs Organization         Address  Phone  Notes  [place]. [address], [place], [place] [phone]   [facility] Outpatient [address] [place] [address] [place], [place]   ADS: Alcohol & Drug Svcs [address] [place], [place]   [place] [address]  [place], [place] or [phone]   Substance Abuse Resources Organization         Address  Phone  Notes  Alcohol and Drug Services  [phone]   [place]  [phone]   The [place]   Chinita Pester  [phone]   Residential & Outpatient Substance Abuse Program  [phone]   Psychological Services Organization         Address  Phone  Notes  [facility] [address] [place]  [place]  [phone]   [place] [address], [place] or [phone]    Mobile Crisis Teams Organization         Address  Phone  Notes  [facility], Mobile Crisis Care Unit  [phone]   [facility]  [address] [place], [place]   [facility] [address] [place] [phone]    Self-Help/Support Groups  Organization         Address  Phone             Notes  [facility] of [place] - variety of support groups  [place] Call for more information  Narcotics Anonymous (NA), [facility] [address], [employer] [place]  2 meetings at this location   Special educational needs teacher         Address  Phone  Notes  [facility] Residential Treatment [place],    [place]  [phone]   [facility]  [address] [place] [phone], [place], [place]   [place], [place] [phone] Admissions: 8am-3pm M-F  Incentives Substance [place] [address],    [place], [place] [phone]   [facility] [address] [place], [place], [place]   [facility] [address]  [place], [place]   [facility] Programs - Intensive Outpatient [address] Kristeen [address], [place], [place]   [facility] ([place] [place].,  [place], [place] [phone] or [phone]   [facility] Services (RTS) [address], [place], [place] Accepts Medicaid  [facility] [place] [address],  [place] [phone] Substance Abuse/Addiction Treatment   [facility] Organization         Address  Phone  Notes  [facility]  [phone]   Domenic Schwab, PhD [address] Arlis [place], [place]   [phone] or  [phone]   Marinette Levasy [place], [place] [phone]   [facility] [address], [place], [place] [phone] Insurance/Medicaid/sponsorship through [facility] and Families [address], [address] [place]                                    [place], [place] [phone] [place] [address], [place] [phone]    Dr. Adele Schilder  [phone]   [facility] Dept. 1) [address] [place] 2) [place] 3)  [place] [address], [place] [phone]  [phone]   [place] [phone] or [phone] (After Hours)       Polysubstance Abuse When people abuse more than one drug or type of drug it is called polysubstance or polydrug abuse. For example, many smokers also drink alcohol. This is one form of polydrug abuse. Polydrug abuse also refers to the use of a drug to counteract an unpleasant effect produced by another drug. It may also be used to help with withdrawal from another drug. People who take stimulants may become agitated. Sometimes this agitation is countered with a tranquilizer. This helps protect against the unpleasant side effects. Polydrug abuse also refers to the use of different drugs at the same time.  Anytime drug use is interfering with normal living activities, it has become abuse. This includes problems with family and friends. Psychological dependence has developed when your mind tells you that the drug is needed. This is usually followed by physical dependence which has developed when continuing increases of drug are required to get the same feeling or "high". This is known as addiction or chemical dependency.  A person's risk is much higher if there is a history of chemical dependency in the family. SIGNS OF CHEMICAL DEPENDENCY  You have been told by friends or family  that drugs have become a problem.  You fight when using drugs.  You are having blackouts (not remembering what you do while using).  You feel sick from using drugs but continue using.  You lie about use or amounts of drugs (chemicals) used.  You need chemicals to get you going.  You are suffering in work performance or in school because of drug use.  You get sick from use of drugs but continue to use anyway.  You need drugs to relate to people or feel comfortable in social situations.  You use drugs to forget problems. "Yes" answered to any of the above signs of chemical dependency indicates there are problems. The longer the use of drugs continues, the greater the problems will become. If there is a family history of drug or alcohol use, it is best not to experiment with these drugs. Continual use leads to tolerance. After tolerance develops more of the drug is needed to get the same feeling. This is followed by addiction. With addiction, drugs become the most important part of life. It becomes more important to take drugs than participate in the other usual activities of life. This includes relating to friends and family. Addiction is followed by dependency. Dependency is a condition where drugs are now needed not just to get high, but to feel normal. Addiction cannot be cured but it can be stopped. This often requires outside help and the care of professionals. Treatment centers are listed in the yellow pages under: Cocaine, Narcotics, and Alcoholics Anonymous. Most hospitals and clinics can refer you to a specialized care center. Talk to your caregiver if you need help. Document Released: 01/13/2005 Document Revised: 08/16/2011 Document Reviewed: 05/24/2005 [facility] Patient Information 2015 [place], [place]. This information is not intended to replace advice given to you by your health care provider. Make sure you discuss any questions you have with your health care provider.  Opioid  Withdrawal Opioids are a group of narcotic drugs. They include the street drug heroin. They also include pain medicines, such as morphine, hydrocodone, oxycodone, and fentanyl. Opioid withdrawal is a group of characteristic physical and mental signs and symptoms. It typically occurs if you have been using opioids daily for several weeks or longer and stop using or rapidly decrease use. Opioid withdrawal can also occur if you have used opioids daily for a long time and are given a medicine to block the effect.  SIGNS AND SYMPTOMS Opioid withdrawal includes three or more of the following symptoms:   Depressed, anxious, or irritable mood.  Nausea or vomiting.  Muscle aches or spasms.   Watery eyes.   Runny nose.  Dilated pupils, sweating, or hairs standing on end.  Diarrhea or intestinal cramping.  Yawning.   Fever.  Increased blood pressure.  Fast pulse.  Restlessness or trouble sleeping. These signs and symptoms occur within several hours of stopping or reducing short-acting opioids, such as heroin. They can occur within 3 days of stopping or reducing long-acting opioids, such as methadone. Withdrawal begins within minutes of receiving a drug that blocks the effects of opioids, such as naltrexone or naloxone. DIAGNOSIS  Opioid use disorder is diagnosed by your health care provider. You will be asked about your symptoms, drug and alcohol use, medical history, and use of medicines. A physical exam may be done.  Lab tests may be ordered. Your health care provider may have you see a mental health professional.  TREATMENT  The treatment for opioid withdrawal is usually provided by medical doctors with special training in substance use disorders (addiction specialists). The following medicines may be included in treatment:  Opioids given in place of the abused opioid. They turn on opioid receptors in the brain and lessen or prevent withdrawal symptoms. They are gradually decreased  (opioid substitution and taper).  Non-opioids that can lessen certain opioid withdrawal symptoms. They may be used alone or with opioid substitution and taper. Successful long-term recovery usually requires medicine, counseling, and group support. HOME CARE INSTRUCTIONS   Take medicines only as directed by your health care provider.  Check with your health care provider before starting new medicines.  Keep all follow-up visits as directed by your health care provider. SEEK MEDICAL CARE IF:  You are not able to take your medicines as directed.  Your symptoms get worse.  You relapse. SEEK IMMEDIATE MEDICAL CARE IF:  You have serious thoughts about hurting yourself or others.  You have a seizure.  You lose consciousness. Document Released: 05/27/2003 Document Revised: 10/08/2013 Document Reviewed: 06/06/2013 [facility] Patient Information 2015 [place], [place]. This information is not intended to replace advice given to you by your health care provider. Make sure you discuss any questions you have with your health care provider.

## 2014-03-26 NOTE — ED Notes (Signed)
Family informed of visiting hours and given visiting hour sheet.
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
## 2014-03-26 NOTE — Discharge Planning (Signed)
[facility] Specialist  Spoke to patient regarding primary care resources and establishing care with a provider. Resource guide and my contact information provided for any future questions or concerns.
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
## 2014-03-26 NOTE — ED Provider Notes (Signed)
Medical screening examination/treatment/procedure(s) were performed by non-physician practitioner and as supervising physician I was immediately available for consultation/collaboration.   EKG Interpretation   Date/Time:  Tuesday March 26 2014 00:53:24 EDT Ventricular Rate:  76 PR Interval:  133 QRS Duration: 68 QT Interval:  421 QTC Calculation: 473 R Axis:   72 Text Interpretation:  Sinus rhythm RSR' in V1 or V2, probably normal  variant Consider left ventricular hypertrophy Confirmed by Kathrynn Humble, MD,  Thelma Comp [phone]) on 03/26/2014 7:15:46 AM       Kalman Drape, MD 03/26/14 [id number]

## 2014-03-26 NOTE — ED Notes (Signed)
telepsych being completed at this time. Pt also has meal tray.

## 2014-03-26 NOTE — ED Notes (Signed)
Gave pt blue paper scrubs since mom took pt belongings home. Pt waiting for ride in waiting room. NAD noted on d/c

## 2014-11-02 ENCOUNTER — Encounter (HOSPITAL_COMMUNITY): Payer: Self-pay | Admitting: Emergency Medicine

## 2014-11-02 ENCOUNTER — Emergency Department (HOSPITAL_COMMUNITY)
Admission: EM | Admit: 2014-11-02 | Discharge: 2014-11-03 | Disposition: A | Discharge status: Home / Self Care | Payer: Self-pay | Attending: Emergency Medicine | Admitting: Emergency Medicine

## 2014-11-02 DIAGNOSIS — L989 Disorder of the skin and subcutaneous tissue, unspecified: Secondary | ICD-10-CM | POA: Insufficient documentation

## 2014-11-02 DIAGNOSIS — F1110 Opioid abuse, uncomplicated: Secondary | ICD-10-CM | POA: Insufficient documentation

## 2014-11-02 DIAGNOSIS — Z720 Tobacco use: Secondary | ICD-10-CM | POA: Insufficient documentation

## 2014-11-02 LAB — CBC WITH DIFFERENTIAL/PLATELET
BASOS PCT: 0 % (ref 0–1)
Basophils Absolute: 0 10*3/uL (ref 0.0–0.1)
Eosinophils Absolute: 0.1 10*3/uL (ref 0.0–0.7)
Eosinophils Relative: 1 % (ref 0–5)
HCT: 37.5 % (ref 36.0–46.0)
Hemoglobin: 12.6 g/dL (ref 12.0–15.0)
LYMPHS ABS: 2.4 10*3/uL (ref 0.7–4.0)
Lymphocytes Relative: 31 % (ref 12–46)
MCH: 28.7 pg (ref 26.0–34.0)
MCHC: 33.6 g/dL (ref 30.0–36.0)
MCV: 85.4 fL (ref 78.0–100.0)
MONO ABS: 0.8 10*3/uL (ref 0.1–1.0)
MONOS PCT: 11 % (ref 3–12)
NEUTROS PCT: 57 % (ref 43–77)
Neutro Abs: 4.3 10*3/uL (ref 1.7–7.7)
Platelets: 233 10*3/uL (ref 150–400)
RBC: 4.39 MIL/uL (ref 3.87–5.11)
RDW: 13.5 % (ref 11.5–15.5)
WBC: 7.6 10*3/uL (ref 4.0–10.5)

## 2014-11-02 MED ORDER — SODIUM CHLORIDE 0.9 % IV SOLN
Freq: Once | INTRAVENOUS | Status: AC
  Administered 2014-11-02: 23:00:00 via INTRAVENOUS

## 2014-11-02 MED ORDER — HYDROXYZINE HCL 10 MG PO TABS
10.0000 mg | ORAL_TABLET | Freq: Once | ORAL | Status: AC
  Administered 2014-11-02: 10 mg via ORAL
  Filled 2014-11-02: qty 1

## 2014-11-02 NOTE — ED Notes (Signed)
Delay in lab draw, pt not in room 

## 2014-11-02 NOTE — ED Notes (Signed)
Warm blanket provided.

## 2014-11-02 NOTE — ED Provider Notes (Signed)
CSN: [id number]     Arrival date & time 11/02/14  2142 History   First MD Initiated Contact with Patient 11/02/14 2225     Chief Complaint  Patient presents with  . Wound Check     (Consider location/radiation/quality/duration/timing/severity/associated sxs/prior Treatment) Patient is a 39 y.o. female presenting with wound check. The history is provided by the patient and medical records. No language interpreter was used.  Wound Check Associated symptoms include a rash. Pertinent negatives include no abdominal pain, chest pain, coughing, diaphoresis, fatigue, fever, headaches, nausea or vomiting.    Hailey Perez is a 39 y.o. female  with a hx of IVDU presents to the Emergency Department complaining of gradual, persistent, progressively worsening rash over arms, legs, chest, abd and face onset 2 days ago.  Pt reports she has been sleeping in a new motel and thinks that there are bugs there.  Pt reports routine and almost daily heroin usage for the past 3 years.  She reports the lesions are very itchy.  No treatments PTA.  Pt denies purulent drainage from the sites, fever, chills, abd pain, N/V.  Pt denies skin popping. Associated symptoms include redness and swelling at each lesion site.  Nothing makes it better and nothing makes it worse.      Past Medical History  Diagnosis Date  . Substance abuse    Past Surgical History  Procedure Laterality Date  . Tubal ligation    . Tubal ligation     Family History  Problem Relation Age of Onset  . Hypertension Mother    History  Substance Use Topics  . Smoking status: Current Every Day Smoker -- 1.00 packs/day    Types: Cigarettes  . Smokeless tobacco: Never Used  . Alcohol Use: No   OB History    No data available     Review of Systems  Constitutional: Negative for fever, diaphoresis, appetite change, fatigue and unexpected weight change.  HENT: Negative for mouth sores.   Eyes: Negative for visual disturbance.  Respiratory:  Negative for cough, chest tightness, shortness of breath and wheezing.   Cardiovascular: Negative for chest pain.  Gastrointestinal: Negative for nausea, vomiting, abdominal pain, diarrhea and constipation.  Endocrine: Negative for polydipsia, polyphagia and polyuria.  Genitourinary: Negative for dysuria, urgency, frequency and hematuria.  Musculoskeletal: Negative for back pain and neck stiffness.  Skin: Positive for rash.  Allergic/Immunologic: Negative for immunocompromised state.  Neurological: Negative for syncope, light-headedness and headaches.  Hematological: Does not bruise/bleed easily.  Psychiatric/Behavioral: Negative for sleep disturbance. The patient is not nervous/anxious.       Allergies  Review of patient's allergies indicates no known allergies.  Home Medications   Prior to Admission medications   Medication Sig Start Date End Date Taking? Authorizing Provider  Aspirin-Salicylamide-Caffeine (BC HEADACHE POWDER PO) Take 1 packet by mouth as needed (for pain).   Yes Historical Provider, MD  acetaminophen (TYLENOL) 325 MG tablet Take 2 tablets (650 mg total) by mouth every 6 (six) hours as needed. Patient not taking: Reported on 11/02/2014 03/26/14   Varney Biles, MD  ibuprofen (ADVIL,MOTRIN) 400 MG tablet Take 1 tablet (400 mg total) by mouth every 6 (six) hours as needed. Patient not taking: Reported on 11/02/2014 03/26/14   Varney Biles, MD  mupirocin cream (BACTROBAN) 2 % Apply 1 application topically 2 (two) times daily. To lesions 11/03/14   Jarrett Soho Jaki Hammerschmidt, PA-C  promethazine (PHENERGAN) 25 MG suppository Place 1 suppository (25 mg total) rectally every 6 (six) hours as  needed for nausea. Patient not taking: Reported on 11/02/2014 03/26/14   Varney Biles, MD  promethazine (PHENERGAN) 25 MG tablet Take 1 tablet (25 mg total) by mouth every 6 (six) hours as needed for nausea. Patient not taking: Reported on 11/02/2014 03/26/14   Varney Biles, MD   BP 117/61  mmHg  Pulse 88  Temp(Src) 98.3 F (36.8 C) (Oral)  Resp 20  SpO2 93%  LMP 10/03/2014 Physical Exam  Constitutional: She is oriented to person, place, and time. She appears well-developed and well-nourished. No distress.  HENT:  Head: Normocephalic and atraumatic.  Right Ear: Tympanic membrane, external ear and ear canal normal.  Left Ear: Tympanic membrane, external ear and ear canal normal.  Nose: Nose normal. No mucosal edema or rhinorrhea.  Mouth/Throat: Uvula is midline. No uvula swelling. No oropharyngeal exudate, posterior oropharyngeal edema, posterior oropharyngeal erythema or tonsillar abscesses.  No swelling of the uvula or oropharynx   Eyes: Conjunctivae are normal.  Neck: Normal range of motion.  Patent airway No stridor; normal phonation Handling secretions without difficulty  Cardiovascular: Normal rate, normal heart sounds and intact distal pulses.   No murmur heard. Pulmonary/Chest: Effort normal and breath sounds normal. No stridor. No respiratory distress. She has no wheezes.  No wheezes or rhonchi  Abdominal: Soft. Bowel sounds are normal. There is no tenderness.  Musculoskeletal: Normal range of motion. She exhibits no edema.  Neurological: She is alert and oriented to person, place, and time.  Skin: Skin is warm and dry. Rash noted. She is not diaphoretic.  Scattered, deroofed lesions over the arms, legs and face; one lesion to the chest and one lesion on the abd; 2 lesions to the upper back - most are scabbed and others are open with minimal serous fluid drainage  Severe excoriations over the lesions noted - mild surrounding erythema with some swelling noted to some of the lesions; no induration or fluctuance to indicate secondary infection  No splinter hemorrhages of the nails No petechiae  Psychiatric: She has a normal mood and affect.  Nursing note and vitals reviewed.   ED Course  Procedures (including critical care time) Labs Review Labs Reviewed   CBC WITH DIFFERENTIAL/PLATELET  I-STAT CHEM 8, ED    Imaging Review No results found.   EKG Interpretation None      MDM   Final diagnoses:  Skin lesions  Heroin abuse   Hailey Perez presents with excoriated lesions to the arms, legs, face, chest and abd.  Pt with hx of IVDU and last usage at noon today.  Pt denies systemic symptoms.  Lesions have redness and mild edema but no induration or fluctuance to indicate abscess.  Will check labs to ensure no systemic infection.    12:14 AM Labs reassuring. I-STAT Chem-8 without acute abnormalities. Patient is afebrile, nontoxic-appearing, without tachycardia or hypoxia. Doubt systemic infections. No murmur. Doubt endocarditis. Patient will be discharged home with a Bactroban for wound. Reasons to return to the emergency department including worsening infection discussed.  BP 117/61 mmHg  Pulse 88  Temp(Src) 98.3 F (36.8 C) (Oral)  Resp 20  SpO2 93%  LMP 10/03/2014   Jarrett Soho Gissela Bloch, PA-C 11/03/14 0045  Dorie Rank, MD 11/06/14 1444

## 2014-11-02 NOTE — ED Notes (Signed)
Pt presents with wounds to entire body, small scab covered wounds, pt does have marks from injecting heroin but feels the new wounds are imbedded bugs.

## 2014-11-02 NOTE — ED Notes (Signed)
Pt extremely fidgety and scratching herself,  States sores came up two days ago,  Uses heroin everyday,  Says she only uses clean needles,  Has been using for three years,  Pt unable to stay calm,  Up and down out of bed on and off phone

## 2014-11-03 MED ORDER — MUPIROCIN CALCIUM 2 % EX CREA: 1.0000 "application " | TOPICAL_CREAM | Freq: Two times a day (BID) | CUTANEOUS | Status: DC

## 2014-11-03 NOTE — Discharge Instructions (Signed)
1. Medications: Bactroban for lesions, benadryl for itching, usual home medications 2. Treatment: rest, drink plenty of fluids, keep wounds clean with warm soap and water 3. Follow Up: Please followup with your primary doctor or community health and wellness in 2 days for discussion of your diagnoses and further evaluation after today's visit; if you do not have a primary care doctor use the resource guide provided to find one; Please return to the ER for worsening symptoms, evidence of developing infection, fevers, vomiting, chest pain or other symptoms   Wound Care Wound care helps prevent pain and infection.  You may need a tetanus shot if:  You cannot remember when you had your last tetanus shot.  You have never had a tetanus shot.  The injury broke your skin. If you need a tetanus shot and you choose not to have one, you may get tetanus. Sickness from tetanus can be serious. HOME CARE   Only take medicine as told by your doctor.  Clean the wound daily with mild soap and water.  Change any bandages (dressings) as told by your doctor.  Put medicated cream and a bandage on the wound as told by your doctor.  Change the bandage if it gets wet, dirty, or starts to smell.  Take showers. Do not take baths, swim, or do anything that puts your wound under water.  Rest and raise (elevate) the wound until the pain and puffiness (swelling) are better.  Keep all doctor visits as told. GET HELP RIGHT AWAY IF:   Yellowish-white fluid (pus) comes from the wound.  Medicine does not lessen your pain.  There is a red streak going away from the wound.  You have a fever. MAKE SURE YOU:   Understand these instructions.  Will watch your condition.  Will get help right away if you are not doing well or get worse. Document Released: 03/02/2008 Document Revised: 08/16/2011 Document Reviewed: 09/27/2010 [facility] Patient Information 2015 [place], [place]. This information is not intended to  replace advice given to you by your health care provider. Make sure you discuss any questions you have with your health care provider.

## 2014-11-05 LAB — I-STAT CHEM 8, ED
BUN: 13 mg/dL (ref 6–20)
CALCIUM ION: 1.19 mmol/L (ref 1.12–1.23)
CHLORIDE: 101 mmol/L (ref 101–111)
CREATININE: 0.9 mg/dL (ref 0.44–1.00)
Glucose, Bld: 110 mg/dL — ABNORMAL HIGH (ref 65–99)
HCT: 39 % (ref 36.0–46.0)
Hemoglobin: 13.3 g/dL (ref 12.0–15.0)
Potassium: 3.3 mmol/L — ABNORMAL LOW (ref 3.5–5.1)
Sodium: 139 mmol/L (ref 135–145)
TCO2: 24 mmol/L (ref 0–100)

## 2014-11-22 ENCOUNTER — Emergency Department (HOSPITAL_COMMUNITY)
Admission: EM | Admit: 2014-11-22 | Discharge: 2014-11-22 | Disposition: A | Discharge status: Home / Self Care | Payer: Self-pay | Attending: Emergency Medicine | Admitting: Emergency Medicine

## 2014-11-22 ENCOUNTER — Encounter (HOSPITAL_COMMUNITY): Payer: Self-pay | Admitting: Emergency Medicine

## 2014-11-22 DIAGNOSIS — R21 Rash and other nonspecific skin eruption: Secondary | ICD-10-CM | POA: Insufficient documentation

## 2014-11-22 DIAGNOSIS — Z720 Tobacco use: Secondary | ICD-10-CM | POA: Insufficient documentation

## 2014-11-22 MED ORDER — SULFAMETHOXAZOLE-TRIMETHOPRIM 800-160 MG PO TABS: 1.0000 | ORAL_TABLET | Freq: Two times a day (BID) | ORAL | Status: AC

## 2014-11-22 MED ORDER — SULFAMETHOXAZOLE-TRIMETHOPRIM 800-160 MG PO TABS
1.0000 | ORAL_TABLET | Freq: Once | ORAL | Status: AC
  Administered 2014-11-22: 1 via ORAL
  Filled 2014-11-22: qty 1

## 2014-11-22 MED ORDER — PERMETHRIN 5 % EX CREA: TOPICAL_CREAM | CUTANEOUS | Status: DC

## 2014-11-22 MED ORDER — DIPHENHYDRAMINE HCL 25 MG PO CAPS
25.0000 mg | ORAL_CAPSULE | Freq: Once | ORAL | Status: AC
  Administered 2014-11-22: 25 mg via ORAL
  Filled 2014-11-22: qty 1

## 2014-11-22 NOTE — Discharge Instructions (Signed)
Take the prescribed medication as directed.  Also recommend that you take benadryl to help with itching. Follow-up with dermatology if no improvement after treatment. Return to the ED for new or worsening symptoms.

## 2014-11-22 NOTE — ED Notes (Signed)
Pt arrives with what appear to be self inflicted pick marks on arms, face, abdomen and legs. States present for two-three weeks. States she was seen at [place] and was RXed with cream that she could not afford. Pt states they show up initially as brown scab, then become aggravated and red. States sores are itchy.

## 2014-11-22 NOTE — ED Provider Notes (Signed)
CSN: [id number]     Arrival date & time 11/22/14  0865 History   First MD Initiated Contact with Patient 11/22/14 0631     Chief Complaint  Patient presents with  . Rash     (Consider location/radiation/quality/duration/timing/severity/associated sxs/prior Treatment) Patient is a 39 y.o. female presenting with rash. The history is provided by the patient and medical records.  Rash   This is a 39 year old female with history of IV heroin use, presenting to the ED for rash. She was seen in the ED a few weeks ago for the same. Patient has pruritic rash to her bilateral upper extremities, trunk, and legs.  She states the lesions start out as a brown scab but then turn pink and sometimes bleed.  She states they are pruritic.  She denies fever, chills, sweats.  She continues using IV heroin.  States she feels that bugs are "biting her" and points to lesion on her back. No hx of HIV, DM, or MRSA.  She was prescribed bactroban cream at earlier ED visit but did not get it filled because it was too expensive.  No chest pain, SOB, abdominal pain, N/V/D.  VSS.  Past Medical History  Diagnosis Date  . Substance abuse    Past Surgical History  Procedure Laterality Date  . Tubal ligation    . Tubal ligation     Family History  Problem Relation Age of Onset  . Hypertension Mother    History  Substance Use Topics  . Smoking status: Current Every Day Smoker -- 1.00 packs/day    Types: Cigarettes  . Smokeless tobacco: Never Used  . Alcohol Use: No   OB History    No data available     Review of Systems  Skin: Positive for rash.  All other systems reviewed and are negative.     Allergies  Review of patient's allergies indicates no known allergies.  Home Medications   Prior to Admission medications   Medication Sig Start Date End Date Taking? Authorizing Provider  acetaminophen (TYLENOL) 325 MG tablet Take 2 tablets (650 mg total) by mouth every 6 (six) hours as needed. Patient not  taking: Reported on 11/02/2014 03/26/14   Varney Biles, MD  Aspirin-Salicylamide-Caffeine (BC HEADACHE POWDER PO) Take 1 packet by mouth as needed (for pain).    Historical Provider, MD  ibuprofen (ADVIL,MOTRIN) 400 MG tablet Take 1 tablet (400 mg total) by mouth every 6 (six) hours as needed. Patient not taking: Reported on 11/02/2014 03/26/14   Varney Biles, MD  mupirocin cream (BACTROBAN) 2 % Apply 1 application topically 2 (two) times daily. To lesions 11/03/14   Jarrett Soho Muthersbaugh, PA-C  promethazine (PHENERGAN) 25 MG suppository Place 1 suppository (25 mg total) rectally every 6 (six) hours as needed for nausea. Patient not taking: Reported on 11/02/2014 03/26/14   Varney Biles, MD  promethazine (PHENERGAN) 25 MG tablet Take 1 tablet (25 mg total) by mouth every 6 (six) hours as needed for nausea. Patient not taking: Reported on 11/02/2014 03/26/14   Varney Biles, MD   BP 139/97 mmHg  Pulse 89  Temp(Src) 97.6 F (36.4 C) (Oral)  Resp 16  SpO2 100%  LMP 09/01/2014 (Approximate)   Physical Exam  Constitutional: She is oriented to person, place, and time. She appears well-developed and well-nourished.  HENT:  Head: Normocephalic and atraumatic.  Mouth/Throat: Oropharynx is clear and moist.  No oral lesions or oral swelling  Eyes: Conjunctivae and EOM are normal. Pupils are equal, round, and reactive to  light.  Neck: Normal range of motion.  Cardiovascular: Normal rate, regular rhythm and normal heart sounds.   Pulmonary/Chest: Effort normal and breath sounds normal.  Abdominal: Soft. Bowel sounds are normal.  Musculoskeletal: Normal range of motion.  Neurological: She is alert and oriented to person, place, and time.  Skin: Skin is warm and dry. Rash noted. No petechiae and no purpura noted.  Multiple excoriated lesions noted across BUE, trunk, and legs-- mostly concentrated along right arm; there is surrounding redness and excoriation of lesions but no abscess formation; no  lesions on palms or soles; no lesions in webbed spaces of fingers; no petechiae  Psychiatric: She has a normal mood and affect.  Nursing note and vitals reviewed.   ED Course  Procedures (including critical care time) Labs Review Labs Reviewed - No data to display  Imaging Review No results found.   EKG Interpretation None      MDM   Final diagnoses:  Rash   39 y.o. F here with rash.  She has lesions on BUE, trunk, legs that have been self excoriated.  There is minimal surrounding erythema but no induration or abscess formation.  She is afebrile, non-toxic but is continuously scratching.  She has no hx of HIV, DM, MRSA but does use IV heroin.  No chest pain or SOB.  No janeway lesions, oslers nodes, new cardiac murmurs, or fever to suggest endocarditis. No lesions on palms, soles, or oral mucosa. Recent lab work done which i have reviewed, do not feel it needs to be repeated today.  She will be started on oral bactrim as she could not afford topical and this is a much cheaper option.  Will also write for permethrin as she insists something is biting her, however i see no evidence of this.  Encouraged benadryl PRN itching.  Given referral to dermatology if symptoms persist.  Discussed plan with patient, he/she acknowledged understanding and agreed with plan of care.  Return precautions given for new or worsening symptoms.   Larene Pickett, PA-C 22/33/61 [id number]  Delora Fuel, MD 97/53/00 5110

## 2014-12-11 ENCOUNTER — Encounter (HOSPITAL_COMMUNITY): Payer: Self-pay | Admitting: Emergency Medicine

## 2014-12-11 DIAGNOSIS — L02413 Cutaneous abscess of right upper limb: Secondary | ICD-10-CM | POA: Insufficient documentation

## 2014-12-11 DIAGNOSIS — Z720 Tobacco use: Secondary | ICD-10-CM | POA: Insufficient documentation

## 2014-12-11 LAB — BASIC METABOLIC PANEL
Anion gap: 9 (ref 5–15)
BUN: 6 mg/dL (ref 6–20)
CALCIUM: 8.2 mg/dL — AB (ref 8.9–10.3)
CO2: 24 mmol/L (ref 22–32)
CREATININE: 0.74 mg/dL (ref 0.44–1.00)
Chloride: 98 mmol/L — ABNORMAL LOW (ref 101–111)
GFR calc non Af Amer: >60 mL/min (ref >60)
Glucose, Bld: 121 mg/dL — ABNORMAL HIGH (ref 65–99)
Potassium: 3.2 mmol/L — ABNORMAL LOW (ref 3.5–5.1)
Sodium: 131 mmol/L — ABNORMAL LOW (ref 135–145)

## 2014-12-11 LAB — CBC WITH DIFFERENTIAL/PLATELET
BASOS ABS: 0 10*3/uL (ref 0.0–0.1)
Basophils Relative: 0 % (ref 0–1)
EOS ABS: 0.2 10*3/uL (ref 0.0–0.7)
EOS PCT: 1 % (ref 0–5)
HEMATOCRIT: 33.4 % — AB (ref 36.0–46.0)
Hemoglobin: 11.3 g/dL — ABNORMAL LOW (ref 12.0–15.0)
LYMPHS PCT: 23 % (ref 12–46)
Lymphs Abs: 2.8 10*3/uL (ref 0.7–4.0)
MCH: 27.9 pg (ref 26.0–34.0)
MCHC: 33.8 g/dL (ref 30.0–36.0)
MCV: 82.5 fL (ref 78.0–100.0)
MONO ABS: 1.2 10*3/uL — AB (ref 0.1–1.0)
Monocytes Relative: 10 % (ref 3–12)
Neutro Abs: 7.9 10*3/uL — ABNORMAL HIGH (ref 1.7–7.7)
Neutrophils Relative %: 66 % (ref 43–77)
Platelets: 216 10*3/uL (ref 150–400)
RBC: 4.05 MIL/uL (ref 3.87–5.11)
RDW: 12.7 % (ref 11.5–15.5)
WBC: 12.1 10*3/uL — ABNORMAL HIGH (ref 4.0–10.5)

## 2014-12-11 NOTE — ED Notes (Signed)
Pt presents with abscess to R forearm. Pt is an IV drug user. Pt reports some drainage from area and chills.

## 2014-12-12 ENCOUNTER — Inpatient Hospital Stay (HOSPITAL_COMMUNITY): Payer: Self-pay | Admitting: Anesthesiology

## 2014-12-12 ENCOUNTER — Emergency Department (HOSPITAL_COMMUNITY): Payer: Self-pay

## 2014-12-12 ENCOUNTER — Inpatient Hospital Stay (HOSPITAL_COMMUNITY)
Admission: EM | Admit: 2014-12-12 | Discharge: 2014-12-13 | DRG: 603 | Disposition: A | Discharge status: Home / Self Care | Payer: Self-pay | Attending: General Surgery | Admitting: General Surgery

## 2014-12-12 ENCOUNTER — Encounter (HOSPITAL_COMMUNITY): Payer: Self-pay | Admitting: *Deleted

## 2014-12-12 ENCOUNTER — Encounter (HOSPITAL_COMMUNITY)
Admission: EM | Disposition: A | Discharge status: Home / Self Care | Payer: Self-pay | Source: Home / Self Care | Attending: General Surgery

## 2014-12-12 ENCOUNTER — Emergency Department (HOSPITAL_COMMUNITY)
Admission: EM | Admit: 2014-12-12 | Discharge: 2014-12-12 | Discharge status: Against Medical Advice | Payer: Self-pay | Attending: Emergency Medicine | Admitting: Emergency Medicine

## 2014-12-12 DIAGNOSIS — F1290 Cannabis use, unspecified, uncomplicated: Secondary | ICD-10-CM | POA: Diagnosis present

## 2014-12-12 DIAGNOSIS — F1910 Other psychoactive substance abuse, uncomplicated: Secondary | ICD-10-CM | POA: Diagnosis present

## 2014-12-12 DIAGNOSIS — F1110 Opioid abuse, uncomplicated: Secondary | ICD-10-CM | POA: Diagnosis present

## 2014-12-12 DIAGNOSIS — L0390 Cellulitis, unspecified: Secondary | ICD-10-CM | POA: Diagnosis present

## 2014-12-12 DIAGNOSIS — Z791 Long term (current) use of non-steroidal anti-inflammatories (NSAID): Secondary | ICD-10-CM

## 2014-12-12 DIAGNOSIS — F17210 Nicotine dependence, cigarettes, uncomplicated: Secondary | ICD-10-CM | POA: Diagnosis present

## 2014-12-12 DIAGNOSIS — F1990 Other psychoactive substance use, unspecified, uncomplicated: Secondary | ICD-10-CM

## 2014-12-12 DIAGNOSIS — L02413 Cutaneous abscess of right upper limb: Principal | ICD-10-CM | POA: Diagnosis present

## 2014-12-12 HISTORY — PX: I & D EXTREMITY: SHX5045

## 2014-12-12 LAB — CBC WITH DIFFERENTIAL/PLATELET
BASOS ABS: 0 10*3/uL (ref 0.0–0.1)
Basophils Relative: 0 % (ref 0–1)
Eosinophils Absolute: 0.1 10*3/uL (ref 0.0–0.7)
Eosinophils Relative: 1 % (ref 0–5)
HCT: 34.8 % — ABNORMAL LOW (ref 36.0–46.0)
Hemoglobin: 11.6 g/dL — ABNORMAL LOW (ref 12.0–15.0)
Lymphocytes Relative: 15 % (ref 12–46)
Lymphs Abs: 1.6 10*3/uL (ref 0.7–4.0)
MCH: 28.2 pg (ref 26.0–34.0)
MCHC: 33.3 g/dL (ref 30.0–36.0)
MCV: 84.7 fL (ref 78.0–100.0)
Monocytes Absolute: 1.2 10*3/uL — ABNORMAL HIGH (ref 0.1–1.0)
Monocytes Relative: 11 % (ref 3–12)
NEUTROS ABS: 8 10*3/uL — AB (ref 1.7–7.7)
NEUTROS PCT: 73 % (ref 43–77)
Platelets: 212 10*3/uL (ref 150–400)
RBC: 4.11 MIL/uL (ref 3.87–5.11)
RDW: 12.8 % (ref 11.5–15.5)
WBC: 10.9 10*3/uL — ABNORMAL HIGH (ref 4.0–10.5)

## 2014-12-12 LAB — BASIC METABOLIC PANEL
ANION GAP: 6 (ref 5–15)
BUN: 9 mg/dL (ref 6–20)
CALCIUM: 8.4 mg/dL — AB (ref 8.9–10.3)
CO2: 27 mmol/L (ref 22–32)
Chloride: 99 mmol/L — ABNORMAL LOW (ref 101–111)
Creatinine, Ser: 0.86 mg/dL (ref 0.44–1.00)
GFR calc Af Amer: >60 mL/min (ref >60)
Glucose, Bld: 118 mg/dL — ABNORMAL HIGH (ref 65–99)
Potassium: 3.2 mmol/L — ABNORMAL LOW (ref 3.5–5.1)
Sodium: 132 mmol/L — ABNORMAL LOW (ref 135–145)

## 2014-12-12 LAB — SEDIMENTATION RATE: Sed Rate: 45 mm/hr — ABNORMAL HIGH (ref 0–22)

## 2014-12-12 LAB — SURGICAL PCR SCREEN
MRSA, PCR: POSITIVE — AB
STAPHYLOCOCCUS AUREUS: POSITIVE — AB

## 2014-12-12 LAB — C-REACTIVE PROTEIN: CRP: 8.9 mg/dL — ABNORMAL HIGH (ref <1.0)

## 2014-12-12 SURGERY — IRRIGATION AND DEBRIDEMENT EXTREMITY
Anesthesia: General | Site: Arm Lower | Laterality: Right

## 2014-12-12 MED ORDER — NICOTINE 14 MG/24HR TD PT24
14.0000 mg | MEDICATED_PATCH | Freq: Every day | TRANSDERMAL | Status: DC
  Administered 2014-12-12 – 2014-12-13 (×2): 14 mg via TRANSDERMAL
  Filled 2014-12-12 (×3): qty 1

## 2014-12-12 MED ORDER — FENTANYL CITRATE (PF) 100 MCG/2ML IJ SOLN
INTRAMUSCULAR | Status: DC | PRN
  Administered 2014-12-12 (×5): 50 ug via INTRAVENOUS

## 2014-12-12 MED ORDER — PROPOFOL 10 MG/ML IV BOLUS
INTRAVENOUS | Status: AC
  Filled 2014-12-12: qty 20

## 2014-12-12 MED ORDER — FENTANYL CITRATE (PF) 100 MCG/2ML IJ SOLN
25.0000 ug | INTRAMUSCULAR | Status: DC | PRN
  Administered 2014-12-12: 25 ug via INTRAVENOUS

## 2014-12-12 MED ORDER — HYDROMORPHONE HCL 1 MG/ML IJ SOLN
1.0000 mg | Freq: Once | INTRAMUSCULAR | Status: AC
  Administered 2014-12-12: 1 mg via INTRAVENOUS
  Filled 2014-12-12: qty 1

## 2014-12-12 MED ORDER — HYDROMORPHONE HCL 2 MG/ML IJ SOLN
INTRAMUSCULAR | Status: AC
  Filled 2014-12-12: qty 1

## 2014-12-12 MED ORDER — ONDANSETRON HCL 4 MG/2ML IJ SOLN
INTRAMUSCULAR | Status: DC | PRN
  Administered 2014-12-12: 4 mg via INTRAVENOUS

## 2014-12-12 MED ORDER — FENTANYL CITRATE (PF) 250 MCG/5ML IJ SOLN
INTRAMUSCULAR | Status: AC
  Filled 2014-12-12: qty 5

## 2014-12-12 MED ORDER — PIPERACILLIN-TAZOBACTAM 3.375 G IVPB
3.3750 g | Freq: Three times a day (TID) | INTRAVENOUS | Status: DC
  Administered 2014-12-12 – 2014-12-13 (×3): 3.375 g via INTRAVENOUS
  Filled 2014-12-12 (×5): qty 50

## 2014-12-12 MED ORDER — HYDROMORPHONE HCL 1 MG/ML IJ SOLN
1.0000 mg | INTRAMUSCULAR | Status: DC | PRN
  Administered 2014-12-12: 1 mg via INTRAVENOUS
  Filled 2014-12-12: qty 1

## 2014-12-12 MED ORDER — PROPOFOL 10 MG/ML IV BOLUS
INTRAVENOUS | Status: DC | PRN
  Administered 2014-12-12: 90 mg via INTRAVENOUS

## 2014-12-12 MED ORDER — BUPIVACAINE HCL (PF) 0.25 % IJ SOLN
INTRAMUSCULAR | Status: DC | PRN
  Administered 2014-12-12: 30 mL

## 2014-12-12 MED ORDER — VANCOMYCIN HCL IN DEXTROSE 1-5 GM/200ML-% IV SOLN
1000.0000 mg | Freq: Once | INTRAVENOUS | Status: AC
  Administered 2014-12-12: 1000 mg via INTRAVENOUS
  Filled 2014-12-12: qty 200

## 2014-12-12 MED ORDER — LACTATED RINGERS IV SOLN: INTRAVENOUS | Status: DC

## 2014-12-12 MED ORDER — LACTATED RINGERS IV SOLN
INTRAVENOUS | Status: DC | PRN
  Administered 2014-12-12: 07:00:00 via INTRAVENOUS

## 2014-12-12 MED ORDER — SODIUM CHLORIDE 0.9 % IV SOLN
INTRAVENOUS | Status: AC
  Administered 2014-12-12: 12:00:00 via INTRAVENOUS

## 2014-12-12 MED ORDER — HYDROMORPHONE HCL 1 MG/ML IJ SOLN
INTRAMUSCULAR | Status: DC | PRN
  Administered 2014-12-12: 0.5 mg via INTRAVENOUS

## 2014-12-12 MED ORDER — FENTANYL CITRATE (PF) 100 MCG/2ML IJ SOLN
INTRAMUSCULAR | Status: AC
  Filled 2014-12-12: qty 2

## 2014-12-12 MED ORDER — ONDANSETRON HCL 4 MG/2ML IJ SOLN
INTRAMUSCULAR | Status: AC
  Filled 2014-12-12: qty 2

## 2014-12-12 MED ORDER — PROMETHAZINE HCL 25 MG/ML IJ SOLN: 6.2500 mg | INTRAMUSCULAR | Status: DC | PRN

## 2014-12-12 MED ORDER — METOCLOPRAMIDE HCL 5 MG/ML IJ SOLN
10.0000 mg | INTRAMUSCULAR | Status: AC
  Administered 2014-12-12: 10 mg via INTRAVENOUS
  Filled 2014-12-12: qty 2

## 2014-12-12 MED ORDER — MIDAZOLAM HCL 5 MG/5ML IJ SOLN
INTRAMUSCULAR | Status: DC | PRN
  Administered 2014-12-12: 2 mg via INTRAVENOUS

## 2014-12-12 MED ORDER — HYDROMORPHONE HCL 1 MG/ML IJ SOLN: 0.5000 mg | INTRAMUSCULAR | Status: DC | PRN

## 2014-12-12 MED ORDER — OXYCODONE HCL 5 MG PO TABS
5.0000 mg | ORAL_TABLET | Freq: Four times a day (QID) | ORAL | Status: DC | PRN
  Administered 2014-12-12 – 2014-12-13 (×2): 5 mg via ORAL
  Filled 2014-12-12 (×2): qty 1

## 2014-12-12 MED ORDER — OXYCODONE-ACETAMINOPHEN 5-325 MG PO TABS
2.0000 | ORAL_TABLET | ORAL | Status: DC | PRN
  Administered 2014-12-12 – 2014-12-13 (×6): 2 via ORAL
  Filled 2014-12-12 (×6): qty 2

## 2014-12-12 MED ORDER — VANCOMYCIN HCL IN DEXTROSE 750-5 MG/150ML-% IV SOLN
750.0000 mg | Freq: Two times a day (BID) | INTRAVENOUS | Status: DC
  Administered 2014-12-13: 750 mg via INTRAVENOUS
  Filled 2014-12-12 (×6): qty 150

## 2014-12-12 MED ORDER — MIDAZOLAM HCL 2 MG/2ML IJ SOLN
INTRAMUSCULAR | Status: AC
Start: 2014-12-12 — End: 2014-12-12
  Filled 2014-12-12: qty 2

## 2014-12-12 MED ORDER — BUPIVACAINE HCL (PF) 0.25 % IJ SOLN
INTRAMUSCULAR | Status: AC
  Filled 2014-12-12: qty 30

## 2014-12-12 MED ORDER — PIPERACILLIN-TAZOBACTAM 3.375 G IVPB
INTRAVENOUS | Status: AC
  Filled 2014-12-12: qty 50

## 2014-12-12 SURGICAL SUPPLY — 33 items
BAG SPEC THK2 15X12 ZIP CLS (MISCELLANEOUS) ×1
BAG ZIPLOCK 12X15 (MISCELLANEOUS) ×3 IMPLANT
BANDAGE ELASTIC 3 VELCRO ST LF (GAUZE/BANDAGES/DRESSINGS) ×2 IMPLANT
BNDG GAUZE ELAST 4 BULKY (GAUZE/BANDAGES/DRESSINGS) ×3 IMPLANT
CORDS BIPOLAR (ELECTRODE) ×3 IMPLANT
CUFF TOURN SGL QUICK 18 (TOURNIQUET CUFF) ×3 IMPLANT
CUFF TOURN SGL QUICK 24 (TOURNIQUET CUFF)
CUFF TRNQT CYL 24X4X40X1 (TOURNIQUET CUFF) IMPLANT
DRAIN PENROSE 18X1/2 LTX STRL (DRAIN) IMPLANT
DRAPE SURG 17X11 SM STRL (DRAPES) ×6 IMPLANT
ELECT REM PT RETURN 9FT ADLT (ELECTROSURGICAL) ×3
ELECTRODE REM PT RTRN 9FT ADLT (ELECTROSURGICAL) ×1 IMPLANT
GAUZE IODOFORM PACK 1/2 7832 (GAUZE/BANDAGES/DRESSINGS) ×2 IMPLANT
GLOVE BIOGEL M STRL SZ7.5 (GLOVE) ×3 IMPLANT
GLOVE BIOGEL PI IND STRL 6.5 (GLOVE) ×1 IMPLANT
GLOVE BIOGEL PI IND STRL 7.0 (GLOVE) ×1 IMPLANT
GLOVE BIOGEL PI IND STRL 7.5 (GLOVE) ×1 IMPLANT
GLOVE BIOGEL PI INDICATOR 6.5 (GLOVE) ×2
GLOVE BIOGEL PI INDICATOR 7.0 (GLOVE) ×2
GLOVE BIOGEL PI INDICATOR 7.5 (GLOVE) ×2
HANDPIECE INTERPULSE COAX TIP (DISPOSABLE) ×3
KIT BASIN OR (CUSTOM PROCEDURE TRAY) ×3 IMPLANT
NEEDLE HYPO 22GX1.5 SAFETY (NEEDLE) ×3 IMPLANT
NS IRRIG 1000ML POUR BTL (IV SOLUTION) ×3 IMPLANT
PACK ORTHO EXTREMITY (CUSTOM PROCEDURE TRAY) ×3 IMPLANT
POSITIONER SURGICAL ARM (MISCELLANEOUS) ×3 IMPLANT
SET HNDPC FAN SPRY TIP SCT (DISPOSABLE) ×1 IMPLANT
SUT ETHILON 3 0 PS 1 (SUTURE) ×2 IMPLANT
SUT ETHILON 4 0 PS 2 18 (SUTURE) ×2 IMPLANT
SWAB COLLECTION DEVICE MRSA (MISCELLANEOUS) ×2 IMPLANT
SYR CONTROL 10ML LL (SYRINGE) ×3 IMPLANT
TUBE ANAEROBIC SPECIMEN COL (MISCELLANEOUS) ×2 IMPLANT
YANKAUER SUCT BULB TIP NO VENT (SUCTIONS) ×2 IMPLANT

## 2014-12-12 NOTE — ED Notes (Signed)
Pt reports R anterior FA abscess x 2 days.  Reports squeezing purulent drainage.  Redness and swelling noted.  Pt unable to straighten her arm out d/t pain and swelling.  Pt reports warm to touch.

## 2014-12-12 NOTE — Anesthesia Postprocedure Evaluation (Signed)
  Anesthesia Post-op Note  Patient: Hailey Perez  Procedure(s) Performed: Procedure(s) (LRB): IRRIGATION AND DEBRIDEMENT EXTREMITY (Right)  Patient Location: PACU  Anesthesia Type: General  Level of Consciousness: awake and alert   Airway and Oxygen Therapy: Patient Spontanous Breathing  Post-op Pain: mild  Post-op Assessment: Post-op Vital signs reviewed, Patient's Cardiovascular Status Stable, Respiratory Function Stable, Patent Airway and No signs of Nausea or vomiting  Last Vitals:  Filed Vitals:   12/12/14 0921  BP: 107/61  Pulse: 56  Temp: 36.9 C  Resp: 14    Post-op Vital Signs: stable   Complications: No apparent anesthesia complications

## 2014-12-12 NOTE — H&P (Signed)
Reason for Consult:infection R arm  Referring Physician: ER  CC:My arm hurts  HPI:  Hailey Perez is an 39 y.o.  female who presents with  Redness, pain, swelling of R elbow area for ~2 days      .   Pain is rated at    /10 and is described as sharp/dull.  Pain is constant.  Pain is made better by rest/immobilization, worse with motion.  Pt has a h/o iv drug abuse, (heroin) injected previously in antecubital fossa. Associated signs/symptoms:swelling, pain, loss of motion Previous treatment:    Past Medical History  Diagnosis Date  . Substance abuse     Past Surgical History  Procedure Laterality Date  . Tubal ligation    . Tubal ligation      Family History  Problem Relation Age of Onset  . Hypertension Mother     Social History:  reports that she has been smoking Cigarettes.  She has been smoking about 0.50 packs per day. She has never used smokeless tobacco. She reports that she uses illicit drugs (Marijuana, Cocaine, and IV). She reports that she does not drink alcohol.  Allergies: No Known Allergies  Medications: I have reviewed the patient's current medications.  Results for orders placed or performed during the hospital encounter of 12/12/14 (from the past 48 hour(s))  CBC with Differential     Status: Abnormal   Collection Time: 12/12/14  2:27 AM  Result Value Ref Range   WBC 10.9 (H) 4.0 - 10.5 K/uL   RBC 4.11 3.87 - 5.11 MIL/uL   Hemoglobin 11.6 (L) 12.0 - 15.0 g/dL   HCT 34.8 (L) 36.0 - 46.0 %   MCV 84.7 78.0 - 100.0 fL   MCH 28.2 26.0 - 34.0 pg   MCHC 33.3 30.0 - 36.0 g/dL   RDW 12.8 11.5 - 15.5 %   Platelets 212 150 - 400 K/uL   Neutrophils Relative % 73 43 - 77 %   Neutro Abs 8.0 (H) 1.7 - 7.7 K/uL   Lymphocytes Relative 15 12 - 46 %   Lymphs Abs 1.6 0.7 - 4.0 K/uL   Monocytes Relative 11 3 - 12 %   Monocytes Absolute 1.2 (H) 0.1 - 1.0 K/uL   Eosinophils Relative 1 0 - 5 %   Eosinophils Absolute 0.1 0.0 - 0.7 K/uL   Basophils Relative 0 0 - 1 %   Basophils Absolute 0.0 0.0 - 0.1 K/uL  Basic metabolic panel     Status: Abnormal   Collection Time: 12/12/14  2:27 AM  Result Value Ref Range   Sodium 132 (L) 135 - 145 mmol/L   Potassium 3.2 (L) 3.5 - 5.1 mmol/L   Chloride 99 (L) 101 - 111 mmol/L   CO2 27 22 - 32 mmol/L   Glucose, Bld 118 (H) 65 - 99 mg/dL   BUN 9 6 - 20 mg/dL   Creatinine, Ser 0.86 0.44 - 1.00 mg/dL   Calcium 8.4 (L) 8.9 - 10.3 mg/dL   GFR calc non Af Amer >60 >60 mL/min   GFR calc Af Amer >60 >60 mL/min    Comment: (NOTE) The eGFR has been calculated using the CKD EPI equation. This calculation has not been validated in all clinical situations. eGFR's persistently <60 mL/min signify possible Chronic Kidney Disease.    Anion gap 6 5 - 15  Sedimentation rate     Status: Abnormal   Collection Time: 12/12/14  2:27 AM  Result Value Ref Range   Sed  Rate 45 (H) 0 - 22 mm/hr    Dg Elbow Complete Right  12/12/2014   CLINICAL DATA:  Substance abuse with proximal forearm abscess for 2 days.  EXAM: RIGHT ELBOW - COMPLETE 3+ VIEW  COMPARISON:  None.  FINDINGS: There is no evidence of fracture, dislocation, or joint effusion. No soft tissue gas or visible foreign body within the thickened ventral proximal forearm soft tissues.  IMPRESSION: Ventral forearm soft tissue swelling. No osseous abnormality, joint effusion, or opaque foreign body.   Electronically Signed   By: Monte Fantasia M.D.   On: 12/12/2014 03:14    Pertinent items are noted in HPI. Temp:  [98.1 F (36.7 C)-98.9 F (37.2 C)] 98.1 F (36.7 C) (07/07 0618) Pulse Rate:  [70-99] 70 (07/07 0618) Resp:  [16-20] 18 (07/07 0618) BP: (91-127)/(52-76) 91/52 mmHg (07/07 0618) SpO2:  [94 %-100 %] 95 % (07/07 0618) Weight:  [44.7 kg (98 lb 8.7 oz)-47.628 kg (105 lb)] 44.7 kg (98 lb 8.7 oz) (07/07 0600) General appearance: alert and cooperative Resp: clear to auscultation bilaterally Cardio: regular rate and rhythm GI: soft, non-tender; bowel sounds normal; no  masses,  no organomegaly Extremities: RUE with erythema, swelling around elbow, abscess forearm volarly near antecubital fossa   Assessment: R forearm abscess Plan: I&D I have discussed this treatment plan in detail with patient , including the risks of the recommended treatment or surgery, the benefits and the alternatives.  The patient  understands that additional treatment may be necessary.  Zaire Vanbuskirk CHRISTOPHER 12/12/2014, 6:59 AM

## 2014-12-12 NOTE — Anesthesia Preprocedure Evaluation (Deleted)
Anesthesia Evaluation   Patient awake    Reviewed: Allergy & Precautions, H&P , NPO status , Patient's Chart, lab work & pertinent test results, reviewed documented beta blocker date and time , Unable to perform ROS - Chart review only  History of Anesthesia Complications Negative for: history of anesthetic complications  Airway Mallampati: II  TM Distance: >3 FB Neck ROM: full    Dental no notable dental hx.    Pulmonary neg pulmonary ROS,  breath sounds clear to auscultation  Pulmonary exam normal       Cardiovascular negative cardio ROS Normal cardiovascular examRhythm:regular Rate:Normal     Neuro/Psych negative neurological ROS  negative psych ROS   GI/Hepatic negative GI ROS, Neg liver ROS,   Endo/Other  negative endocrine ROS  Renal/GU negative Renal ROS     Musculoskeletal   Abdominal   Peds  Hematology negative hematology ROS (+)   Anesthesia Other Findings   Reproductive/Obstetrics negative OB ROS                             Anesthesia Physical Anesthesia Plan  ASA: II  Anesthesia Plan: General   Post-op Pain Management:    Induction:   Airway Management Planned:   Additional Equipment:   Intra-op Plan:   Post-operative Plan:   Informed Consent: I have reviewed the patients History and Physical, chart, labs and discussed the procedure including the risks, benefits and alternatives for the proposed anesthesia with the patient or authorized representative who has indicated his/her understanding and acceptance.   Dental Advisory Given  Plan Discussed with: Anesthesiologist and CRNA  Anesthesia Plan Comments:         Anesthesia Quick Evaluation

## 2014-12-12 NOTE — Progress Notes (Signed)
Patient was caught by RN smoking in the bathroom. Rn asked patient not to do this, and was also informed that attending MD had been paged about getting an order for a nicotene patch.   RN also asked patient to give all belongings to family, including cigarettes, or belongings would have to be taken to security.

## 2014-12-12 NOTE — Progress Notes (Signed)
ANTIBIOTIC CONSULT NOTE - INITIAL  Pharmacy Consult for zosyn Indication: Wound infection  No Known Allergies  Patient Measurements: Height: 5' (152.4 cm) Weight: 98 lb 8.7 oz (44.7 kg) IBW/kg (Calculated) : 45.5 Adjusted Body Weight:   Vital Signs: Temp: 98.1 F (36.7 C) (07/07 0618) Temp Source: Oral (07/07 0618) BP: 91/52 mmHg (07/07 0618) Pulse Rate: 70 (07/07 0618) Intake/Output from previous day:   Intake/Output from this shift:    Labs:  Recent Labs  12/11/14 2218 12/12/14 0227  WBC 12.1* 10.9*  HGB 11.3* 11.6*  PLT 216 212  CREATININE 0.74 0.86   Estimated Creatinine Clearance: 63.8 mL/min (by C-G formula based on Cr of 0.86). No results for input(s): VANCOTROUGH, VANCOPEAK, VANCORANDOM, GENTTROUGH, GENTPEAK, GENTRANDOM, TOBRATROUGH, TOBRAPEAK, TOBRARND, AMIKACINPEAK, AMIKACINTROU, AMIKACIN in the last 72 hours.   Microbiology: No results found for this or any previous visit (from the past 720 hour(s)).  Medical History: Past Medical History  Diagnosis Date  . Substance abuse     Medications:  Anti-infectives    Start     Dose/Rate Route Frequency Ordered Stop   12/12/14 0645  piperacillin-tazobactam (ZOSYN) IVPB 3.375 g     3.375 g 12.5 mL/hr over 240 Minutes Intravenous 3 times per day 12/12/14 0637     12/12/14 0245  vancomycin (VANCOCIN) IVPB 1000 mg/200 mL premix     1,000 mg 200 mL/hr over 60 Minutes Intravenous  Once 12/12/14 0244 12/12/14 0411     Assessment: Patient with wound infection.    Goal of Therapy:  Zosyn based on renal function   Plan:  Measure antibiotic drug levels at steady state Follow up culture results  Zosyn 3.375g IV Q8H infused over 4hrs.   Tyler Deis, Shea Stakes Crowford 12/12/2014,6:39 AM

## 2014-12-12 NOTE — Op Note (Signed)
[name], [name]                ACCOUNT NO.:  [id number]  MEDICAL RECORD NO.:  [id number]  LOCATION:  [facility]                         FACILITY:  [facility]  PHYSICIAN:  Dennie Bible, MD    DATE OF BIRTH:  Sep 05, 1975  DATE OF PROCEDURE:  12/12/2014 DATE OF DISCHARGE:                              OPERATIVE REPORT   PREOPERATIVE DIAGNOSIS:  Cellulitis and abscess of the right forearm.  POSTOPERATIVE DIAGNOSIS:  Cellulitis and abscess of the right forearm.  PROCEDURE:  Incision and drainage of right forearm abscess, packing with Iodoform gauze and placement of drain.  INDICATIONS:  Hailey Perez is a 39 year old female, presented to the ER with pain and swelling of her right forearm, inability to move her elbow.  She has a history of IV drug abuse and suspected abscess was diagnosed.  I was consulted for definitive care.  On examination, she indeed had an abscess with tracking around her posterior elbow.  Risks, benefits, and alternatives of I and D were discussed with the patient, she agreed with this, and consent was obtained.  DESCRIPTION OF PROCEDURE:  The patient was taken to the operating room, placed supine on the operating table.  General anesthesia was administered without difficulty.  Time-out was performed, identifying the correct extremity.  The arm was elevated and exsanguinated. Tourniquet was used on the upper arm, inflated to 200 mmHg.  An incision overlying the pointing abscess was made in the volar aspect of the upper forearm close to the antecubital fossa.  Immediately, there was gross purulent drainage.  This was sent for culture.  Further exploration of this abscess cavity revealed tracking along the lateral aspect of the elbow.  This was all superficial to the deep fascia.  The fascia in the center of the abscess was actually opened and there did not appear to be any deep abscess subfascial.  Next, the wound was thoroughly irrigated with approximately 3 L of  saline solution and gently debrided with a sponge.  An access point for placement of a 15-French drain was made in the upper arm and a small drain was placed into the abscess cavity in the subcutaneous layers or prefascial layers.  The remaining incision was gently approximated with couple of 4-0 nylons and then some half- inch Iodoform gauze was placed to the wound.  Sterile dressing was applied.  Approximately 30 mL of 0.25% plain Marcaine was infiltrated around the abscess cavity and subcutaneous tissues for postoperative pain control.  The patient tolerated the procedure well and sent to the recovery room in stable condition.     Dennie Bible, MD     HCC/MEDQ  D:  12/12/2014  T:  12/12/2014  Job:  [id number]

## 2014-12-12 NOTE — Anesthesia Procedure Notes (Signed)
Procedure Name: LMA Insertion Date/Time: 12/12/2014 7:17 AM Performed by: Anne Fu Pre-anesthesia Checklist: Patient identified, Emergency Drugs available, Suction available, Patient being monitored and Timeout performed Patient Re-evaluated:Patient Re-evaluated prior to inductionOxygen Delivery Method: Circle system utilized Preoxygenation: Pre-oxygenation with 100% oxygen Intubation Type: IV induction Ventilation: Mask ventilation without difficulty LMA: LMA inserted LMA Size: 3.0 Number of attempts: 1 Placement Confirmation: positive ETCO2 and breath sounds checked- equal and bilateral Tube secured with: Tape

## 2014-12-12 NOTE — ED Provider Notes (Signed)
CSN: [id number]     Arrival date & time 12/12/14  0051 History   First MD Initiated Contact with Patient 12/12/14 0151     Chief Complaint  Patient presents with  . Abscess    (Consider location/radiation/quality/duration/timing/severity/associated sxs/prior Treatment) HPI Comments: 39 year old female with history of substance abuse presents to the emergency department for further evaluation of right arm redness and swelling. Patient states that symptoms have been worsening over the past 2 days. She describes a constant, throbbing pain at site of redness and swelling. No medications taken prior to arrival for symptoms. Patient reports that a friend of hers squeezed her arm and expelled some purulent drainage. She denies any spontaneous drainage from the wound. She reports and inability to extend her elbow secondary to pain and swelling. Patient last used IV heroin 2 days ago, prior to symptom onset. She denies losing a needle in her arm. She last sniffed heroin at 2000 yesterday; she has been snorting drugs as a result of her inability to use IV. Patient denies fever and numbness in her RUE. No hx of prior abscess from IVDA.  Patient is a 39 y.o. female presenting with abscess. The history is provided by the patient. No language interpreter was used.  Abscess Associated symptoms: no fever     Past Medical History  Diagnosis Date  . Substance abuse    Past Surgical History  Procedure Laterality Date  . Tubal ligation    . Tubal ligation     Family History  Problem Relation Age of Onset  . Hypertension Mother    History  Substance Use Topics  . Smoking status: Current Every Day Smoker -- 0.50 packs/day    Types: Cigarettes  . Smokeless tobacco: Never Used  . Alcohol Use: No   OB History    No data available      Review of Systems  Constitutional: Negative for fever.  Musculoskeletal: Positive for myalgias and arthralgias.  Skin: Positive for color change.  Neurological:  Negative for weakness and numbness.  All other systems reviewed and are negative.   Allergies  Review of patient's allergies indicates no known allergies.  Home Medications   Prior to Admission medications   Medication Sig Start Date End Date Taking? Authorizing Provider  ibuprofen (ADVIL,MOTRIN) 200 MG tablet Take 400 mg by mouth every 6 (six) hours as needed for moderate pain.   Yes Historical Provider, MD   BP 127/76 mmHg  Pulse 87  Temp(Src) 98.5 F (36.9 C) (Oral)  Resp 20  SpO2 100%  LMP 12/09/2014   Physical Exam  Constitutional: She is oriented to person, place, and time. She appears well-developed and well-nourished. No distress.  Chronically thin appearing  HENT:  Head: Normocephalic and atraumatic.  Multiple dental caries and cracked dentition  Eyes: Conjunctivae and EOM are normal. No scleral icterus.  Neck: Normal range of motion.  Cardiovascular: Normal rate, regular rhythm and intact distal pulses.   Distal radial pulse 2+ in the right upper extremity. Capillary refill brisk in all digits of right hand.  Pulmonary/Chest: Effort normal. No respiratory distress.  Respirations even and unlabored  Musculoskeletal:       Right elbow: She exhibits decreased range of motion and swelling. She exhibits no deformity. Tenderness (diffuse) found.       Right forearm: She exhibits tenderness and swelling. She exhibits no bony tenderness, no deformity and no laceration.       Arms: Inability to extend R elbow much past 90  secondary to pain and swelling. No specific bony TTP. No crepitus.  Neurological: She is alert and oriented to person, place, and time. She exhibits normal muscle tone. Coordination normal.  Sensation to light touch intact in the right upper extremity. Normal grip strength.  Skin: Skin is warm and dry. She is not diaphoretic.  Psychiatric: She has a normal mood and affect. Her behavior is normal.  Nursing note and vitals reviewed.   ED Course    Procedures (including critical care time) Labs Review Labs Reviewed  CBC WITH DIFFERENTIAL/PLATELET - Abnormal; Notable for the following:    WBC 10.9 (*)    Hemoglobin 11.6 (*)    HCT 34.8 (*)    Neutro Abs 8.0 (*)    Monocytes Absolute 1.2 (*)    All other components within normal limits  BASIC METABOLIC PANEL - Abnormal; Notable for the following:    Sodium 132 (*)    Potassium 3.2 (*)    Chloride 99 (*)    Glucose, Bld 118 (*)    Calcium 8.4 (*)    All other components within normal limits  SEDIMENTATION RATE - Abnormal; Notable for the following:    Sed Rate 45 (*)    All other components within normal limits  CULTURE, BLOOD (ROUTINE X 2)  CULTURE, BLOOD (ROUTINE X 2)  C-REACTIVE PROTEIN    Imaging Review Dg Elbow Complete Right  12/12/2014   CLINICAL DATA:  Substance abuse with proximal forearm abscess for 2 days.  EXAM: RIGHT ELBOW - COMPLETE 3+ VIEW  COMPARISON:  None.  FINDINGS: There is no evidence of fracture, dislocation, or joint effusion. No soft tissue gas or visible foreign body within the thickened ventral proximal forearm soft tissues.  IMPRESSION: Ventral forearm soft tissue swelling. No osseous abnormality, joint effusion, or opaque foreign body.   Electronically Signed   By: Monte Fantasia M.D.   On: 12/12/2014 03:14     EKG Interpretation None       Medications  HYDROmorphone (DILAUDID) injection 1 mg (1 mg Intravenous Given 12/12/14 0311)  metoCLOPramide (REGLAN) injection 10 mg (10 mg Intravenous Given 12/12/14 0311)  vancomycin (VANCOCIN) IVPB 1000 mg/200 mL premix (1,000 mg Intravenous New Bag/Given 12/12/14 0311)    MDM   Final diagnoses:  Abscess of forearm, right  IVDU (intravenous drug user)    Dr. Lenon Curt to admit for operative I&D of abscess of R forearm. Patient neurovascularly intact. Pain well controlled after IV Dilaudid 1mg ; patient declines additional medication at this time. Temporary orders placed for admission, per request of Dr.  Lenon Curt.   Filed Vitals:   12/12/14 0102 12/12/14 0316 12/12/14 0320  BP: 120/71 127/76 127/76  Pulse: 96 99 87  Temp: 98.4 F (36.9 C)  98.5 F (36.9 C)  TempSrc: Oral  Oral  Resp: 16 18 20   SpO2: 100% 100% 100%     Antonietta Breach, PA-C 12/12/14 0429  Julianne Rice, MD 12/12/14 [phone]

## 2014-12-12 NOTE — Anesthesia Preprocedure Evaluation (Addendum)
Anesthesia Evaluation  Patient identified by MRN, date of birth, ID band Patient awake    Reviewed: Allergy & Precautions, H&P , NPO status , Patient's Chart, lab work & pertinent test results, reviewed documented beta blocker date and time , Unable to perform ROS - Chart review only  History of Anesthesia Complications Negative for: history of anesthetic complications  Airway Mallampati: II  TM Distance: >3 FB Neck ROM: full    Dental  (+) Chipped Several chipped teeth on upper and lower, none loose:   Pulmonary neg pulmonary ROS, Current Smoker,  breath sounds clear to auscultation  Pulmonary exam normal       Cardiovascular negative cardio ROS Normal cardiovascular examRhythm:regular Rate:Normal  No recent ekg, vital signs stable   Neuro/Psych negative neurological ROS  negative psych ROS   GI/Hepatic negative GI ROS, Neg liver ROS,   Endo/Other  negative endocrine ROS  Renal/GU negative Renal ROS  negative genitourinary   Musculoskeletal negative musculoskeletal ROS (+)   Abdominal   Peds negative pediatric ROS (+)  Hematology negative hematology ROS (+)   Anesthesia Other Findings Right arm abscess currently from herion injections, says she does it every day  Reproductive/Obstetrics negative OB ROS                            Anesthesia Physical Anesthesia Plan  ASA: III  Anesthesia Plan: General and General LMA   Post-op Pain Management:    Induction:   Airway Management Planned:   Additional Equipment:   Intra-op Plan:   Post-operative Plan:   Informed Consent: I have reviewed the patients History and Physical, chart, labs and discussed the procedure including the risks, benefits and alternatives for the proposed anesthesia with the patient or authorized representative who has indicated his/her understanding and acceptance.   Dental Advisory Given  Plan Discussed  with: Anesthesiologist and CRNA  Anesthesia Plan Comments:        Anesthesia Quick Evaluation

## 2014-12-12 NOTE — ED Notes (Signed)
Patient transported to X-ray 

## 2014-12-12 NOTE — Progress Notes (Signed)
Pt with h/o IV drug abuse, has abscess in R forearm . Plan: I&D R forearm abscess, has rec'd dose of Vancomycin in ED.  Full consult/H&P to follow.

## 2014-12-12 NOTE — ED Notes (Signed)
Pt called for room assignment x3. No response.

## 2014-12-12 NOTE — Progress Notes (Signed)
ANTIBIOTIC CONSULT NOTE - INITIAL  Pharmacy Consult for Vancomycin & Zosyn Indication: RUE infection  No Known Allergies  Patient Measurements: Height: 5' (152.4 cm) Weight: 98 lb 8.7 oz (44.7 kg) IBW/kg (Calculated) : 45.5  Vital Signs: Temp: 98.1 F (36.7 C) (07/07 0618) Temp Source: Oral (07/07 0618) BP: 91/52 mmHg (07/07 0618) Pulse Rate: 70 (07/07 0618) Intake/Output from previous day:   Intake/Output from this shift:    Labs:  Recent Labs  12/11/14 2218 12/12/14 0227  WBC 12.1* 10.9*  HGB 11.3* 11.6*  PLT 216 212  CREATININE 0.74 0.86   Estimated Creatinine Clearance: 63.8 mL/min (by C-G formula based on Cr of 0.86). No results for input(s): VANCOTROUGH, VANCOPEAK, VANCORANDOM, GENTTROUGH, GENTPEAK, GENTRANDOM, TOBRATROUGH, TOBRAPEAK, TOBRARND, AMIKACINPEAK, AMIKACINTROU, AMIKACIN in the last 72 hours.   Microbiology: No results found for this or any previous visit (from the past 720 hour(s)).  Medical History: Past Medical History  Diagnosis Date  . Substance abuse    Medications:  Scheduled:  . sodium chloride   Intravenous STAT  . [MAR Hold] piperacillin-tazobactam (ZOSYN)  IV  3.375 g Intravenous 3 times per day   Anti-infectives    Start     Dose/Rate Route Frequency Ordered Stop   12/12/14 0645  [MAR Hold]  piperacillin-tazobactam (ZOSYN) IVPB 3.375 g     (MAR Hold since 12/12/14 0706)   3.375 g 12.5 mL/hr over 240 Minutes Intravenous 3 times per day 12/12/14 0637     12/12/14 0245  vancomycin (VANCOCIN) IVPB 1000 mg/200 mL premix     1,000 mg 200 mL/hr over 60 Minutes Intravenous  Once 12/12/14 0244 12/12/14 0411     Assessment: 36 yoF with RUE abscess from IV drug use, heroin per patient. Urine drug screen positive for Opiates, Cocaine, Benzodiazepines, THC. XRay shows cellulitis, no joint involvement. Pharmacy to dose Vancomycin and Zosyn.  Vancomycin 1gm x1 in ED 0300  I&D 7/7 am, no deep fascial involvement  WBC 10.9K , afebrile,  renal function wnl  Goal of Therapy:  Vancomycin trough level 10-15 mcg/ml   Plan:   Continue Zosyn 3.375gm q8hr - 4 hr infusion  Vancomycin 750mg  IV q12  Monitor cultures  Minda Ditto PharmD Pager [phone] 12/12/2014, 8:49 AM

## 2014-12-12 NOTE — Transfer of Care (Signed)
Immediate Anesthesia Transfer of Care Note  Patient: Hailey Perez  Procedure(s) Performed: Procedure(s): IRRIGATION AND DEBRIDEMENT EXTREMITY (Right)  Patient Location: PACU  Anesthesia Type:General  Level of Consciousness:  sedated, patient cooperative and responds to stimulation  Airway & Oxygen Therapy:Patient Spontanous Breathing and Patient connected to face mask oxgen  Post-op Assessment:  Report given to PACU RN and Post -op Vital signs reviewed and stable  Post vital signs:  Reviewed and stable  Last Vitals:  Filed Vitals:   12/12/14 0618  BP: 91/52  Pulse: 70  Temp: 36.7 C  Resp: 18    Complications: No apparent anesthesia complications

## 2014-12-13 MED ORDER — CHLORHEXIDINE GLUCONATE CLOTH 2 % EX PADS
6.0000 | MEDICATED_PAD | Freq: Every day | CUTANEOUS | Status: DC
  Administered 2014-12-13: 6 via TOPICAL

## 2014-12-13 MED ORDER — DOXYCYCLINE HYCLATE 50 MG PO CAPS: 100.0000 mg | ORAL_CAPSULE | Freq: Two times a day (BID) | ORAL | Status: DC

## 2014-12-13 MED ORDER — MUPIROCIN 2 % EX OINT
1.0000 "application " | TOPICAL_OINTMENT | Freq: Two times a day (BID) | CUTANEOUS | Status: DC
  Administered 2014-12-13: 1 via NASAL
  Filled 2014-12-13: qty 22

## 2014-12-13 MED ORDER — CLONIDINE HCL 0.1 MG PO TABS
0.1000 mg | ORAL_TABLET | Freq: Every day | ORAL | Status: DC
  Administered 2014-12-13: 0.1 mg via ORAL
  Filled 2014-12-13: qty 1

## 2014-12-13 NOTE — Progress Notes (Signed)
12/13/14  0757  Patient c/o withdrawal symptoms. She states she is having body aches, hot/cold sweats, anxiety 8/10. MD notified.

## 2014-12-13 NOTE — Discharge Instructions (Signed)

## 2014-12-16 ENCOUNTER — Encounter (HOSPITAL_COMMUNITY): Payer: Self-pay | Admitting: General Surgery

## 2014-12-17 LAB — CULTURE, BLOOD (ROUTINE X 2)
CULTURE: NO GROWTH
CULTURE: NO GROWTH

## 2014-12-17 NOTE — Discharge Summary (Signed)
[name], Hailey Perez                ACCOUNT NO.:  [id number]  MEDICAL RECORD NO.:  [id number]  LOCATION:  5409                         FACILITY:  [facility]  PHYSICIAN:  Dennie Bible, MD    DATE OF BIRTH:  [birth year]  DATE OF ADMISSION:  12/12/2014 DATE OF DISCHARGE:  12/13/2014                              DISCHARGE SUMMARY   ADMITTING DIAGNOSES:  Cellulitis and abscess of the right forearm.  DISCHARGE DIAGNOSES:  Cellulitis and abscess of the right forearm, and status post incision and drainage of abscess of the right forearm.  HOSPITAL COURSE:  Hailey Perez was seen in the early morning of December 12, 2014, with a diagnosis of abscess from IV drug abuse.  I was consulted and she was placed on the operative schedule for the morning of December 12, 2014, where she underwent an I and D of the abscess.  Please see the operative note for full details.  The patient was kept in the hospital until Friday, December 13, 2014, for continued IV antibiotics due to the severe nature of her infection.  She also had packing placed at the time of surgery and a drain placed at the time of surgery.  On December 13, 2014, the patient was evaluated as her arm was much improved.  She had minimal drainage from the wound.  She was afebrile.  At this time, the packing was removed.  The wound was irrigated with saline solution and then the drain was discontinued.  The patient was sent home with specific wound care instructions, meaning wash the wound with soap and water, apply a small amount of antibiotic ointment and keep the wound covered at all times.  She was sent home with a prescription of doxycycline 100 mg p.o. b.i.d. for a total of 10 days.  She is to come to my office next week for suture removal.  She is to call the office to make an appointment.     Dennie Bible, MD     HCC/MEDQ  D:  12/16/2014  T:  12/17/2014  Job:  [id number]

## 2014-12-23 ENCOUNTER — Emergency Department (HOSPITAL_COMMUNITY)
Admission: EM | Admit: 2014-12-23 | Discharge: 2014-12-23 | Discharge status: Against Medical Advice | Payer: Self-pay | Attending: Emergency Medicine | Admitting: Emergency Medicine

## 2014-12-23 ENCOUNTER — Encounter (HOSPITAL_COMMUNITY): Payer: Self-pay | Admitting: *Deleted

## 2014-12-23 DIAGNOSIS — Z4802 Encounter for removal of sutures: Secondary | ICD-10-CM | POA: Insufficient documentation

## 2014-12-23 DIAGNOSIS — Z5321 Procedure and treatment not carried out due to patient leaving prior to being seen by health care provider: Secondary | ICD-10-CM

## 2014-12-23 DIAGNOSIS — Z720 Tobacco use: Secondary | ICD-10-CM | POA: Insufficient documentation

## 2014-12-23 NOTE — ED Provider Notes (Signed)
Patient LWBS.   Baron Sane, PA-C 12/23/14 2051  Gareth Morgan, MD 12/24/14 [phone]

## 2014-12-23 NOTE — ED Notes (Signed)
Pt not in room when PA went into see pt

## 2014-12-23 NOTE — ED Notes (Signed)
Pt states that she had surgery for an abscess on 7/7; pt states that she needs to have the sutures removed

## 2014-12-23 NOTE — ED Notes (Signed)
Pt never returned to ER room; unable to locate pt; pt eloped from fast track

## 2015-06-22 ENCOUNTER — Encounter (HOSPITAL_COMMUNITY): Payer: Self-pay

## 2015-06-22 ENCOUNTER — Emergency Department (HOSPITAL_COMMUNITY)
Admission: EM | Admit: 2015-06-22 | Discharge: 2015-06-22 | Disposition: A | Discharge status: Home / Self Care | Payer: Self-pay | Attending: Emergency Medicine | Admitting: Emergency Medicine

## 2015-06-22 DIAGNOSIS — Z792 Long term (current) use of antibiotics: Secondary | ICD-10-CM | POA: Insufficient documentation

## 2015-06-22 DIAGNOSIS — R21 Rash and other nonspecific skin eruption: Secondary | ICD-10-CM | POA: Insufficient documentation

## 2015-06-22 DIAGNOSIS — F17210 Nicotine dependence, cigarettes, uncomplicated: Secondary | ICD-10-CM | POA: Insufficient documentation

## 2015-06-22 MED ORDER — DIPHENHYDRAMINE HCL 25 MG PO CAPS: 25.0000 mg | ORAL_CAPSULE | Freq: Four times a day (QID) | ORAL | Status: DC | PRN

## 2015-06-22 MED ORDER — MUPIROCIN CALCIUM 2 % EX CREA: 1.0000 "application " | TOPICAL_CREAM | Freq: Three times a day (TID) | CUTANEOUS | Status: DC

## 2015-06-22 MED ORDER — CEPHALEXIN 500 MG PO CAPS: 500.0000 mg | ORAL_CAPSULE | Freq: Four times a day (QID) | ORAL | Status: DC

## 2015-06-22 MED ORDER — DIPHENHYDRAMINE HCL 25 MG PO CAPS
25.0000 mg | ORAL_CAPSULE | Freq: Once | ORAL | Status: AC
  Administered 2015-06-22: 25 mg via ORAL
  Filled 2015-06-22: qty 1

## 2015-06-22 NOTE — Discharge Instructions (Signed)

## 2015-06-22 NOTE — ED Notes (Signed)
Pt has had a rash for several months all over her skin. States she felt something pop in her ear the other day and saw something crawling under her skin. Use to do heroin.

## 2015-06-22 NOTE — ED Provider Notes (Signed)
CSN: [id number]     Arrival date & time 06/22/15  2234 History  By signing my name below, I, Hailey Perez, attest that this documentation has been prepared under the direction and in the presence of [employer], PA-C. Electronically Signed: Randa Perez, ED Scribe. 06/22/2015. 11:13 PM.    Chief Complaint  Patient presents with  . Rash    Patient is a 40 y.o. female presenting with rash. The history is provided by the patient. No language interpreter was used.  Rash  HPI Comments: Hailey Perez is a 40 y.o. female who presents to the Emergency Department complaining of non-painful generalized rash onset 4 months months prior. Pt reports that she felt something crawling over her skin. Pt also reports that her "ear is clogged up. " Pt states that she was recently evaluated for the same rash. She states that she was compliant with the medications prescribed that have not provided any relief. She states that during her last visit she was told that the rash was due to her heroin use. Denies fever, nausea or vomiting. Pt states that her last heroin use was 2 month prior.   Past Medical History  Diagnosis Date  . Substance abuse    Past Surgical History  Procedure Laterality Date  . Tubal ligation    . Tubal ligation    . I&d extremity Right 12/12/2014    Procedure: IRRIGATION AND DEBRIDEMENT EXTREMITY;  Surgeon: Dayna Barker, MD;  Location: [facility]S;  Service: Plastics;  Laterality: Right;   Family History  Problem Relation Age of Onset  . Hypertension Mother    Social History  Substance Use Topics  . Smoking status: Current Every Day Smoker -- 0.50 packs/day    Types: Cigarettes  . Smokeless tobacco: Never Used  . Alcohol Use: No   OB History    No data available     Review of Systems  Skin: Positive for rash.   A complete 10 system review of systems was obtained and all systems are negative except as noted in the HPI and PMH.     Allergies  Review of patient's  allergies indicates no known allergies.  Home Medications   Prior to Admission medications   Medication Sig Start Date End Date Taking? Authorizing Provider  cephALEXin (KEFLEX) 500 MG capsule Take 1 capsule (500 mg total) by mouth 4 (four) times daily. 06/22/15   Gloriann Loan, PA-C  diphenhydrAMINE (BENADRYL) 25 mg capsule Take 1 capsule (25 mg total) by mouth every 6 (six) hours as needed. 06/22/15   Gloriann Loan, PA-C  doxycycline (VIBRAMYCIN) 50 MG capsule Take 2 capsules (100 mg total) by mouth 2 (two) times daily. 12/13/14   Dayna Barker, MD  ibuprofen (ADVIL,MOTRIN) 200 MG tablet Take 400 mg by mouth every 6 (six) hours as needed for moderate pain.    Historical Provider, MD  mupirocin cream (BACTROBAN) 2 % Apply 1 application topically 3 (three) times daily. 06/22/15   Jagjit Riner, PA-C   BP 129/83 mmHg  Pulse 101  Temp(Src) 98.1 F (36.7 C) (Oral)  Resp 18  SpO2 97%  LMP 05/11/2015   Physical Exam  Constitutional: She is oriented to person, place, and time. She appears well-developed and well-nourished. No distress.  HENT:  Head: Normocephalic and atraumatic.  Right Ear: Tympanic membrane normal. No foreign bodies.  Left Ear: Tympanic membrane normal. No foreign bodies.  Mouth/Throat: Oropharynx is clear and moist.  Eyes: Conjunctivae and EOM are normal. Pupils are equal, round, and  reactive to light.  Neck: Normal range of motion. Neck supple. No tracheal deviation present.  Cardiovascular: Normal rate, regular rhythm and normal heart sounds.   No murmur heard. Pulmonary/Chest: Effort normal and breath sounds normal. No accessory muscle usage or stridor. No respiratory distress. She has no wheezes. She has no rhonchi. She has no rales.  Abdominal: Soft. Bowel sounds are normal. She exhibits no distension. There is no tenderness.  Musculoskeletal: Normal range of motion.  Lymphadenopathy:    She has no cervical adenopathy.  Neurological: She is alert and oriented to person, place,  and time.  Speech clear without dysarthria.  Skin: Skin is warm and dry. Rash noted.  Scattered maculopapular with excoriations and scabbing. Few areas with surrounding erythema.  No induration or fluctuance to suggest abscess.  Psychiatric: She has a normal mood and affect. Her behavior is normal.  Nursing note and vitals reviewed.   ED Course  Procedures (including critical care time) DIAGNOSTIC STUDIES: Oxygen Saturation is 98% on RA, normal by my interpretation.    COORDINATION OF CARE: 11:11 PM-Discussed treatment plan with pt at bedside and pt agreed to plan.     Labs Review Labs Reviewed - No data to display  Imaging Review No results found. .   EKG Interpretation None      MDM   Final diagnoses:  Rash    Known heroin user, but patient denies recent use (last 2 months ago). Patient presents with scattered excoriated lesions without signs of infection.  No systemic symptoms.  VSS, NAD.  No murmur appreciated.  Doubt endocarditis.  Will give benadryl, mupirocin, and keflex.  I personally performed the services described in this documentation, which was scribed in my presence. The recorded information has been reviewed and is accurate.      Gloriann Loan, PA-C 06/22/15 [id number]  April Palumbo, MD 06/23/15 [phone]

## 2016-12-13 ENCOUNTER — Emergency Department (HOSPITAL_COMMUNITY)
Admission: EM | Admit: 2016-12-13 | Discharge: 2016-12-13 | Disposition: A | Discharge status: Home / Self Care | Payer: Self-pay | Attending: Emergency Medicine | Admitting: Emergency Medicine

## 2016-12-13 ENCOUNTER — Encounter (HOSPITAL_COMMUNITY): Payer: Self-pay | Admitting: Emergency Medicine

## 2016-12-13 DIAGNOSIS — R21 Rash and other nonspecific skin eruption: Secondary | ICD-10-CM | POA: Insufficient documentation

## 2016-12-13 DIAGNOSIS — F17210 Nicotine dependence, cigarettes, uncomplicated: Secondary | ICD-10-CM | POA: Insufficient documentation

## 2016-12-13 DIAGNOSIS — Z79899 Other long term (current) drug therapy: Secondary | ICD-10-CM | POA: Insufficient documentation

## 2016-12-13 MED ORDER — DOXYCYCLINE HYCLATE 100 MG PO CAPS: 100.0000 mg | ORAL_CAPSULE | Freq: Two times a day (BID) | ORAL | 0 refills | Status: DC

## 2016-12-13 NOTE — ED Triage Notes (Signed)
patient reports rash on face, arms and on her stomach that have come up in past couple days after getting out of jail.  Patient also headache that started during the night. Patient reports eyes burning and skin burning around nose and mouth and peeling.

## 2016-12-13 NOTE — ED Provider Notes (Signed)
[facility] DEPT Provider Note   CSN: [id number] Arrival date & time: 12/13/16  0813     History   Chief Complaint Chief Complaint  Patient presents with  . Rash  . Headache    HPI Hailey Perez is a 41 y.o. female.  41 year old female presents with diffuse pruritic rash 2 days after getting on gel. No fever or chills. Rashes on her face and extremities. Denies any new chemical exposure. Symptoms persistent and nothing makes it better. No treatment use prior to arrival      Past Medical History:  Diagnosis Date  . Substance abuse     Patient Active Problem List   Diagnosis Date Noted  . Abscess of forearm, right 12/12/2014    Past Surgical History:  Procedure Laterality Date  . I&D EXTREMITY Right 12/12/2014   Procedure: IRRIGATION AND DEBRIDEMENT EXTREMITY;  Surgeon: Dayna Barker, MD;  Location: [facility];  Service: Plastics;  Laterality: Right;  . TUBAL LIGATION    . TUBAL LIGATION      OB History    No data available       Home Medications    Prior to Admission medications   Medication Sig Start Date End Date Taking? Authorizing Provider  cephALEXin (KEFLEX) 500 MG capsule Take 1 capsule (500 mg total) by mouth 4 (four) times daily. 06/22/15   Gloriann Loan, PA-C  diphenhydrAMINE (BENADRYL) 25 mg capsule Take 1 capsule (25 mg total) by mouth every 6 (six) hours as needed. 06/22/15   Gloriann Loan, PA-C  doxycycline (VIBRAMYCIN) 50 MG capsule Take 2 capsules (100 mg total) by mouth 2 (two) times daily. 12/13/14   Dayna Barker, MD  ibuprofen (ADVIL,MOTRIN) 200 MG tablet Take 400 mg by mouth every 6 (six) hours as needed for moderate pain.    [provider]  mupirocin cream (BACTROBAN) 2 % Apply 1 application topically 3 (three) times daily. 06/22/15   Gloriann Loan, PA-C    Family History Family History  Problem Relation Age of Onset  . Hypertension Mother     Social History Social History  Substance Use Topics  . Smoking status: Current Every  Day Smoker    Packs/day: 0.50    Types: Cigarettes  . Smokeless tobacco: Never Used  . Alcohol use No     Allergies   Patient has no known allergies.   Review of Systems Review of Systems  All other systems reviewed and are negative.    Physical Exam Updated Vital Signs BP (!) 145/93 (BP Location: Left Arm)   Pulse 100   Temp 98.8 F (37.1 C) (Oral)   Resp 17   Ht 1.524 m (5')   Wt 44.7 kg (98 lb 7 oz)   LMP 12/05/2016   SpO2 100%   BMI 19.22 kg/m   Physical Exam  Constitutional: She is oriented to person, place, and time. She appears well-developed and well-nourished.  Non-toxic appearance. No distress.  HENT:  Head: Normocephalic and atraumatic.  Eyes: Conjunctivae, EOM and lids are normal. Pupils are equal, round, and reactive to light.  Neck: Normal range of motion. Neck supple. No tracheal deviation present. No thyroid mass present.  Cardiovascular: Normal rate, regular rhythm and normal heart sounds.  Exam reveals no gallop.   No murmur heard. Pulmonary/Chest: Effort normal and breath sounds normal. No stridor. No respiratory distress. She has no decreased breath sounds. She has no wheezes. She has no rhonchi. She has no rales.  Abdominal: Soft. Normal appearance and bowel sounds are  normal. She exhibits no distension. There is no tenderness. There is no rebound and no CVA tenderness.  Musculoskeletal: Normal range of motion. She exhibits no edema or tenderness.  Neurological: She is alert and oriented to person, place, and time. She has normal strength. No cranial nerve deficit or sensory deficit. GCS eye subscore is 4. GCS verbal subscore is 5. GCS motor subscore is 6.  Skin: Skin is warm and dry. Rash noted.  Diffuse rash noted to the face and upper extremities with macular shape to it with some erythema and some golden crusting without active purulent drainage.  Psychiatric: She has a normal mood and affect. Her speech is normal and behavior is normal.    Nursing note and vitals reviewed.    ED Treatments / Results  Labs (all labs ordered are listed, but only abnormal results are displayed) Labs Reviewed - No data to display  EKG  EKG Interpretation None       Radiology No results found.  Procedures Procedures (including critical care time)  Medications Ordered in ED Medications - No data to display   Initial Impression / Assessment and Plan / ED Course  I have reviewed the triage vital signs and the nursing notes.  Pertinent labs & imaging results that were available during my care of the patient were reviewed by me and considered in my medical decision making (see chart for details).     Patient findings suspicious for MRSA and we placed on doxycycline.  Final Clinical Impressions(s) / ED Diagnoses   Final diagnoses:  None    New Prescriptions New Prescriptions   No medications on file     Lacretia Leigh, MD 12/13/16 [phone]

## 2016-12-13 NOTE — ED Notes (Signed)
Patient is alert and oriented x3.  She was given DC instructions and follow up visit instructions.  Patient gave verbal understanding. She was DC ambulatory under her own power to home.  V/S stable.  He was not showing any signs of distress on DC 

## 2017-03-01 ENCOUNTER — Emergency Department (HOSPITAL_COMMUNITY)
Admission: EM | Admit: 2017-03-01 | Discharge: 2017-03-01 | Discharge status: Against Medical Advice | Payer: Self-pay | Attending: Emergency Medicine | Admitting: Emergency Medicine

## 2017-03-01 ENCOUNTER — Encounter (HOSPITAL_COMMUNITY): Payer: Self-pay | Admitting: Emergency Medicine

## 2017-03-01 DIAGNOSIS — Z5321 Procedure and treatment not carried out due to patient leaving prior to being seen by health care provider: Secondary | ICD-10-CM | POA: Insufficient documentation

## 2017-03-01 LAB — URINALYSIS, ROUTINE W REFLEX MICROSCOPIC
BILIRUBIN URINE: NEGATIVE
Glucose, UA: NEGATIVE mg/dL
KETONES UR: NEGATIVE mg/dL
NITRITE: POSITIVE — AB
PH: 5 (ref 5.0–8.0)
Protein, ur: 30 mg/dL — AB
Specific Gravity, Urine: 1.021 (ref 1.005–1.030)

## 2017-03-01 LAB — POC URINE PREG, ED: PREG TEST UR: NEGATIVE

## 2017-03-01 NOTE — ED Triage Notes (Signed)
Patient c/o back/flank pain x 2 weeks that got worse today. Patient reports that she had issues with having little urine when urinates and having to go more than normal.

## 2017-03-01 NOTE — ED Notes (Signed)
No responce

## 2017-03-01 NOTE — ED Notes (Signed)
Did not respond

## 2017-03-05 ENCOUNTER — Emergency Department (HOSPITAL_BASED_OUTPATIENT_CLINIC_OR_DEPARTMENT_OTHER)
Admission: EM | Admit: 2017-03-05 | Discharge: 2017-03-05 | Disposition: A | Discharge status: Home / Self Care | Payer: Self-pay | Attending: Emergency Medicine | Admitting: Emergency Medicine

## 2017-03-05 ENCOUNTER — Encounter (HOSPITAL_BASED_OUTPATIENT_CLINIC_OR_DEPARTMENT_OTHER): Payer: Self-pay | Admitting: Emergency Medicine

## 2017-03-05 DIAGNOSIS — H9201 Otalgia, right ear: Secondary | ICD-10-CM | POA: Insufficient documentation

## 2017-03-05 DIAGNOSIS — F17210 Nicotine dependence, cigarettes, uncomplicated: Secondary | ICD-10-CM | POA: Insufficient documentation

## 2017-03-05 DIAGNOSIS — Z79899 Other long term (current) drug therapy: Secondary | ICD-10-CM | POA: Insufficient documentation

## 2017-03-05 DIAGNOSIS — N390 Urinary tract infection, site not specified: Secondary | ICD-10-CM | POA: Insufficient documentation

## 2017-03-05 LAB — URINALYSIS, MICROSCOPIC (REFLEX)
RBC / HPF: NONE SEEN RBC/hpf (ref 0–5)
WBC, UA: NONE SEEN WBC/hpf (ref 0–5)

## 2017-03-05 LAB — URINALYSIS, ROUTINE W REFLEX MICROSCOPIC
BILIRUBIN URINE: NEGATIVE
Glucose, UA: NEGATIVE mg/dL
Hgb urine dipstick: NEGATIVE
Ketones, ur: NEGATIVE mg/dL
Leukocytes, UA: NEGATIVE
NITRITE: POSITIVE — AB
PH: 6.5 (ref 5.0–8.0)
Protein, ur: NEGATIVE mg/dL
SPECIFIC GRAVITY, URINE: 1.02 (ref 1.005–1.030)

## 2017-03-05 LAB — PREGNANCY, URINE: Preg Test, Ur: NEGATIVE

## 2017-03-05 MED ORDER — CEPHALEXIN 500 MG PO CAPS: 500.0000 mg | ORAL_CAPSULE | Freq: Two times a day (BID) | ORAL | 0 refills | Status: AC

## 2017-03-05 NOTE — Discharge Instructions (Signed)
Please reattach information regarding your condition. Take Keflex twice daily for 5 days. Return to ED for worsening pain, vomiting, fevers, severe back pain.

## 2017-03-05 NOTE — ED Triage Notes (Signed)
L flank pain x 3 weeks, reports little urine when she urinates and increase in frequency.

## 2017-03-05 NOTE — ED Provider Notes (Signed)
[facility] DEPT [facility] Provider Note   CSN: [id number] Arrival date & time: 03/05/17  1834     History   Chief Complaint Chief Complaint  Patient presents with  . Flank Pain    HPI Hailey Perez is a 41 y.o. female.  HPI  Patient presents to ED for evaluation of left flank pain for the past week. Reports pain is sharp and intermittent. Reports history of similar symptoms in the past due to UTI. She also reports some urinary frequency and dysuria. She denies any nausea, vomiting, fevers, bowel changes, history of kidney stones. She also reports of right ear pain for the past 3 days. She feels as though there is fluid in her ear but nothing is draining from it. She denies any changes in hearing, external ear pain.  Past Medical History:  Diagnosis Date  . Substance abuse     Patient Active Problem List   Diagnosis Date Noted  . Abscess of forearm, right 12/12/2014    Past Surgical History:  Procedure Laterality Date  . I&D EXTREMITY Right 12/12/2014   Procedure: IRRIGATION AND DEBRIDEMENT EXTREMITY;  Surgeon: Dayna Barker, MD;  Location: [facility];  Service: Plastics;  Laterality: Right;  . TUBAL LIGATION    . TUBAL LIGATION      OB History    No data available       Home Medications    Prior to Admission medications   Medication Sig Start Date End Date Taking? Authorizing Provider  cephALEXin (KEFLEX) 500 MG capsule Take 1 capsule (500 mg total) by mouth 2 (two) times daily. 03/05/17 03/10/17  Aleshka Corney, PA-C  diphenhydrAMINE (BENADRYL) 25 mg capsule Take 1 capsule (25 mg total) by mouth every 6 (six) hours as needed. 06/22/15   Gloriann Loan, PA-C  doxycycline (VIBRAMYCIN) 100 MG capsule Take 1 capsule (100 mg total) by mouth 2 (two) times daily. 12/13/16   Lacretia Leigh, MD  doxycycline (VIBRAMYCIN) 50 MG capsule Take 2 capsules (100 mg total) by mouth 2 (two) times daily. 12/13/14   Dayna Barker, MD  ibuprofen (ADVIL,MOTRIN) 200 MG tablet Take 400 mg by mouth every  6 (six) hours as needed for moderate pain.    [provider]  mupirocin cream (BACTROBAN) 2 % Apply 1 application topically 3 (three) times daily. 06/22/15   Gloriann Loan, PA-C    Family History Family History  Problem Relation Age of Onset  . Hypertension Mother     Social History Social History  Substance Use Topics  . Smoking status: Current Every Day Smoker    Packs/day: 0.50    Types: Cigarettes  . Smokeless tobacco: Never Used  . Alcohol use No     Allergies   Patient has no known allergies.   Review of Systems Review of Systems  Constitutional: Negative for appetite change, chills and fever.  HENT: Positive for ear pain. Negative for ear discharge, hearing loss, rhinorrhea, sneezing and sore throat.   Eyes: Negative for photophobia and visual disturbance.  Respiratory: Negative for cough, chest tightness, shortness of breath and wheezing.   Cardiovascular: Negative for chest pain and palpitations.  Gastrointestinal: Negative for abdominal pain, blood in stool, constipation, diarrhea, nausea and vomiting.  Genitourinary: Positive for dysuria, flank pain, frequency and urgency. Negative for hematuria.  Musculoskeletal: Negative for myalgias.  Skin: Negative for rash.  Neurological: Negative for dizziness, weakness and light-headedness.     Physical Exam Updated Vital Signs BP 116/71 (BP Location: Right Arm)   Pulse 79  Temp 98.5 F (36.9 C) (Oral)   Resp 20   Ht 4\' 11"  (1.499 m)   Wt 52.2 kg (115 lb)   LMP 02/22/2017   SpO2 98%   BMI 23.23 kg/m   Physical Exam  Constitutional: She appears well-developed and well-nourished. No distress.  HENT:  Head: Normocephalic and atraumatic.  Right Ear: Tympanic membrane is not scarred.  Left Ear: Tympanic membrane normal.  Nose: Nose normal.  Soft white earwax noted on right canal.  Eyes: Conjunctivae and EOM are normal. Left eye exhibits no discharge. No scleral icterus.  Neck: Normal range of motion.  Neck supple.  Cardiovascular: Normal rate, regular rhythm, normal heart sounds and intact distal pulses.  Exam reveals no gallop and no friction rub.   No murmur heard. Pulmonary/Chest: Effort normal and breath sounds normal. No respiratory distress.  Abdominal: Soft. Bowel sounds are normal. She exhibits no distension. There is no tenderness. There is no guarding.  Left-sided CVA tenderness.  Musculoskeletal: Normal range of motion. She exhibits no edema.  Neurological: She is alert. She exhibits normal muscle tone. Coordination normal.  Skin: Skin is warm and dry. No rash noted.  Psychiatric: She has a normal mood and affect.  Nursing note and vitals reviewed.    ED Treatments / Results  Labs (all labs ordered are listed, but only abnormal results are displayed) Labs Reviewed  URINALYSIS, ROUTINE W REFLEX MICROSCOPIC - Abnormal; Notable for the following:       Result Value   Nitrite POSITIVE (*)    All other components within normal limits  URINALYSIS, MICROSCOPIC (REFLEX) - Abnormal; Notable for the following:    Bacteria, UA MANY (*)    Squamous Epithelial / LPF 0-5 (*)    All other components within normal limits  PREGNANCY, URINE    EKG  EKG Interpretation None       Radiology No results found.  Procedures Procedures (including critical care time)  Medications Ordered in ED Medications - No data to display   Initial Impression / Assessment and Plan / ED Course  I have reviewed the triage vital signs and the nursing notes.  Pertinent labs & imaging results that were available during my care of the patient were reviewed by me and considered in my medical decision making (see chart for details).     Patient presents to ED for evaluation of 2 complaints. Her first complaint is left-sided flank pain for the past week. Reports history of dysuria, urinary frequency and similar symptoms to prior UTI that she had several months ago. She denies any fevers, chills,  nausea, vomiting, bowel changes or abdominal pain. She does have left-sided CVA tenderness. She is afebrile. Urinalysis with evidence of UTI. Patient also complains of right ear pain for the past 2 days. Feels as though there is fluid in her ear but nothing is draining from it. On physical exam there is soft white earwax noted on the right canal. This was drained and irrigated with removal of Q-tip tip. We will treat with Keflex for UTI. Patient appears stable for discharge at this time. Strict return precautions given.  Final Clinical Impressions(s) / ED Diagnoses   Final diagnoses:  Lower urinary tract infectious disease    New Prescriptions New Prescriptions   CEPHALEXIN (KEFLEX) 500 MG CAPSULE    Take 1 capsule (500 mg total) by mouth 2 (two) times daily.     Delia Heady, PA-C 03/05/17 2044    Carmin Muskrat, MD 03/05/17 2330

## 2017-03-05 NOTE — ED Notes (Signed)
ED Provider at bedside. 

## 2017-04-18 ENCOUNTER — Encounter (HOSPITAL_COMMUNITY): Payer: Self-pay

## 2017-04-18 ENCOUNTER — Other Ambulatory Visit: Payer: Self-pay

## 2017-04-18 ENCOUNTER — Emergency Department (HOSPITAL_COMMUNITY)
Admission: EM | Admit: 2017-04-18 | Discharge: 2017-04-18 | Disposition: A | Discharge status: Home / Self Care | Payer: Self-pay | Attending: Emergency Medicine | Admitting: Emergency Medicine

## 2017-04-18 DIAGNOSIS — Z7982 Long term (current) use of aspirin: Secondary | ICD-10-CM | POA: Insufficient documentation

## 2017-04-18 DIAGNOSIS — F17210 Nicotine dependence, cigarettes, uncomplicated: Secondary | ICD-10-CM | POA: Insufficient documentation

## 2017-04-18 DIAGNOSIS — F1110 Opioid abuse, uncomplicated: Secondary | ICD-10-CM

## 2017-04-18 DIAGNOSIS — L02413 Cutaneous abscess of right upper limb: Secondary | ICD-10-CM | POA: Insufficient documentation

## 2017-04-18 MED ORDER — DOXYCYCLINE HYCLATE 100 MG PO TABS
100.0000 mg | ORAL_TABLET | Freq: Once | ORAL | Status: AC
  Administered 2017-04-18: 100 mg via ORAL
  Filled 2017-04-18: qty 1

## 2017-04-18 MED ORDER — LIDOCAINE-EPINEPHRINE (PF) 2 %-1:200000 IJ SOLN
20.0000 mL | Freq: Once | INTRAMUSCULAR | Status: AC
  Administered 2017-04-18: 20 mL
  Filled 2017-04-18: qty 20

## 2017-04-18 MED ORDER — ACETAMINOPHEN 325 MG PO TABS
650.0000 mg | ORAL_TABLET | Freq: Once | ORAL | Status: AC
  Administered 2017-04-18: 650 mg via ORAL
  Filled 2017-04-18: qty 2

## 2017-04-18 MED ORDER — DOXYCYCLINE HYCLATE 100 MG PO CAPS: 100.0000 mg | ORAL_CAPSULE | Freq: Two times a day (BID) | ORAL | 0 refills | Status: DC

## 2017-04-18 NOTE — ED Notes (Signed)
SUTURE CART AND MATERIALS AT BEDSIDE

## 2017-04-18 NOTE — ED Triage Notes (Signed)
Patient is an IV drug abuser and reports that she has had redness and swelling to the right forearm x 4 days. Patient denies any fever.

## 2017-04-18 NOTE — ED Notes (Signed)
ED Provider at bedside. 

## 2017-04-18 NOTE — ED Provider Notes (Signed)
[place] DEPT Provider Note   CSN: [id number] Arrival date & time: 04/18/17  1556     History   Chief Complaint Chief Complaint  Patient presents with  . Abscess    HPI Hailey Perez is a 41 y.o. female.  HPI Patient is a 41 year old female presents to emergency department with an abscess of her right forearm for the past several days. She has a history of IV drug abuse. No drainage. No fevers or chills. She reports some discomfort and redness in her right forearm in addition to the large area of swelling   She has a history of IV drug abus no drainage.  No fevers or chills.  History of same before.  Past Medical History:  Diagnosis Date  . Substance abuse [facility])     Patient Active Problem List   Diagnosis Date Noted  . Abscess of forearm, right 12/12/2014    Past Surgical History:  Procedure Laterality Date  . TUBAL LIGATION    . TUBAL LIGATION      OB History    No data available       Home Medications    Prior to Admission medications   Medication Sig Start Date End Date Taking? Authorizing Provider  Aspirin-Salicylamide-Caffeine (BC HEADACHE POWDER PO) Take 1 packet daily as needed by mouth (PAIN).   Yes [provider]  doxycycline (VIBRAMYCIN) 100 MG capsule Take 1 capsule (100 mg total) 2 (two) times daily by mouth. 04/18/17   Jola Schmidt, MD    Family History Family History  Problem Relation Age of Onset  . Hypertension Mother     Social History Social History   Tobacco Use  . Smoking status: Current Every Day Smoker    Packs/day: 0.50    Types: Cigarettes  . Smokeless tobacco: Never Used  Substance Use Topics  . Alcohol use: No  . Drug use: Yes    Types: Marijuana, Cocaine, IV    Comment: daily use of heroin     Allergies   Patient has no known allergies.   Review of Systems Review of Systems  All other systems reviewed and are negative.    Physical Exam Updated Vital Signs BP  126/80 (BP Location: Left Arm)   Pulse (!) 106   Temp 98.2 F (36.8 C) (Oral)   Resp 18   Ht 5' (1.524 m)   Wt 49.9 kg (110 lb)   SpO2 100%   BMI 21.48 kg/m   Physical Exam  Constitutional: She is oriented to person, place, and time. She appears well-developed and well-nourished.  HENT:  Head: Normocephalic.  Eyes: EOM are normal.  Neck: Normal range of motion.  Pulmonary/Chest: Effort normal.  Abdominal: She exhibits no distension.  Musculoskeletal: Normal range of motion.  Abscess mid right anterior forearm withoutAbscess mid right anterior forearm without drainage. Normal right radial pulse. Normal grip strength right hand. Full range of motion of right elbow and right wrist. drainage.  Normal right radial pulse.  Normal grip strength right hand.  Full range of motion of right elbow and right wrist.  Neurological: She is alert and oriented to person, place, and time.  Psychiatric: She has a normal mood and affect.  Nursing note and vitals reviewed.    ED Treatments / Results  Labs (all labs ordered are listed, but only abnormal results are displayed) Labs Reviewed - No data to display  EKG  EKG Interpretation None       Radiology No results  found.  Procedures .Marland KitchenIncision and Drainage Performed by: Jola Schmidt, MD Authorized by: Jola Schmidt, MD     Consent: Verbal consent obtained. Risks and benefits: risks, benefits and alternatives were discussed Time out performed prior to procedure Type: abscess Body area: right forearm Anesthesia: local infiltration Incision was made with a scalpel. Local anesthetic: lidocaine 2 % with epinephrine Anesthetic total: 7 ml Complexity: complex Blunt dissection to break up loculations Drainage: purulent Drainage amount:  Moderat Packing material: none Patient tolerance: Patient tolerated the procedure well with no immediate complications.     Medications Ordered in ED Medications  lidocaine-EPINEPHrine  (XYLOCAINE W/EPI) 2 %-1:200000 (PF) injection 20 mL (not administered)  doxycycline (VIBRA-TABS) tablet 100 mg (not administered)  acetaminophen (TYLENOL) tablet 650 mg (650 mg Oral Given 04/18/17 1639)     Initial Impression / Assessment and Plan / ED Course  I have reviewed the triage vital signs and the nursing notes.  Pertinent labs & imaging results that were available during my care of the patient were reviewed by me and considered in my medical decision making (see chart for details).     I and d in the ER. Home on abx. Return precautions given  Final Clinical Impressions(s) / ED Diagnoses   Final diagnoses:  Abscess of right forearm  Heroin abuse [facility])    ED Discharge Orders        Ordered    doxycycline (VIBRAMYCIN) 100 MG capsule  2 times daily     04/18/17 1721       Jola Schmidt, MD 04/18/17 1731

## 2017-05-20 ENCOUNTER — Emergency Department (HOSPITAL_COMMUNITY)
Admission: EM | Admit: 2017-05-20 | Discharge: 2017-05-21 | Disposition: A | Discharge status: Home / Self Care | Payer: Self-pay | Attending: Emergency Medicine | Admitting: Emergency Medicine

## 2017-05-20 DIAGNOSIS — E860 Dehydration: Secondary | ICD-10-CM | POA: Insufficient documentation

## 2017-05-20 DIAGNOSIS — F1910 Other psychoactive substance abuse, uncomplicated: Secondary | ICD-10-CM | POA: Insufficient documentation

## 2017-05-20 DIAGNOSIS — F17210 Nicotine dependence, cigarettes, uncomplicated: Secondary | ICD-10-CM | POA: Insufficient documentation

## 2017-05-20 NOTE — ED Triage Notes (Addendum)
Patient arrives by  EMS after boyfriend called 911 and states she overdosed on heroin. Boyfriend administered Narcan 4 mg IN, even though patient was awake and "curled up in a ball shaking" EMS administered Versed 10 mg and Haldol 5 mg for agitation. Patient remains agitated, localized pain/non-verbal.

## 2017-05-21 ENCOUNTER — Other Ambulatory Visit: Payer: Self-pay

## 2017-05-21 ENCOUNTER — Encounter (HOSPITAL_COMMUNITY): Payer: Self-pay

## 2017-05-21 LAB — CBC
HCT: 35.3 % — ABNORMAL LOW (ref 36.0–46.0)
Hemoglobin: 11.6 g/dL — ABNORMAL LOW (ref 12.0–15.0)
MCH: 26.5 pg (ref 26.0–34.0)
MCHC: 32.9 g/dL (ref 30.0–36.0)
MCV: 80.6 fL (ref 78.0–100.0)
PLATELETS: 186 10*3/uL (ref 150–400)
RBC: 4.38 MIL/uL (ref 3.87–5.11)
RDW: 14.8 % (ref 11.5–15.5)
WBC: 12.2 10*3/uL — AB (ref 4.0–10.5)

## 2017-05-21 LAB — BASIC METABOLIC PANEL
ANION GAP: 9 (ref 5–15)
BUN: 13 mg/dL (ref 6–20)
CO2: 23 mmol/L (ref 22–32)
Calcium: 8.5 mg/dL — ABNORMAL LOW (ref 8.9–10.3)
Chloride: 107 mmol/L (ref 101–111)
Creatinine, Ser: 0.73 mg/dL (ref 0.44–1.00)
Glucose, Bld: 113 mg/dL — ABNORMAL HIGH (ref 65–99)
POTASSIUM: 3.7 mmol/L (ref 3.5–5.1)
SODIUM: 139 mmol/L (ref 135–145)

## 2017-05-21 LAB — ETHANOL

## 2017-05-21 MED ORDER — FENTANYL CITRATE (PF) 100 MCG/2ML IJ SOLN
50.0000 ug | Freq: Once | INTRAMUSCULAR | Status: AC
  Administered 2017-05-21: 50 ug via INTRAVENOUS
  Filled 2017-05-21: qty 2

## 2017-05-21 MED ORDER — SODIUM CHLORIDE 0.9 % IV BOLUS (SEPSIS)
1000.0000 mL | Freq: Once | INTRAVENOUS | Status: AC
  Administered 2017-05-21: 1000 mL via INTRAVENOUS

## 2017-05-21 NOTE — ED Provider Notes (Signed)
[place] DEPT Provider Note   CSN: [id number] Arrival date & time: 05/20/17  2351     History   Chief Complaint Chief Complaint  Patient presents with  . Heroin Overdose   Level 5 caveat: Altered mental status  HPI Hailey Perez is a 41 y.o. female.  HPI She is a 41 year old female with known history of polysubstance abuse.  She is with her boyfriend today who reports that after he got out of the shower he found her minimally responsive and it was believed that the boyfriend that she had overdosed on heroin.  She was given Narcan and EMS was contacted.  On EMS arrival the patient was awake and curled up in a ball shaking and in route became more violent.  She was given a total of 2 mg of Versed and 5 mg of Haldol for acute agitation.  On arrival to emergency department she is tachycardic to the 130s and appears to be somewhat agitated.   Past Medical History:  Diagnosis Date  . Substance abuse [facility])     Patient Active Problem List   Diagnosis Date Noted  . Abscess of forearm, right 12/12/2014    Past Surgical History:  Procedure Laterality Date  . I&D EXTREMITY Right 12/12/2014   Procedure: IRRIGATION AND DEBRIDEMENT EXTREMITY;  Surgeon: Dayna Barker, MD;  Location: [facility];  Service: Plastics;  Laterality: Right;  . TUBAL LIGATION    . TUBAL LIGATION      OB History    No data available       Home Medications    Prior to Admission medications   Medication Sig Start Date End Date Taking? Authorizing Provider  Aspirin-Salicylamide-Caffeine (BC HEADACHE POWDER PO) Take 1 packet daily as needed by mouth (PAIN).   Yes [provider]  doxycycline (VIBRAMYCIN) 100 MG capsule Take 1 capsule (100 mg total) 2 (two) times daily by mouth. Patient not taking: Reported on 05/21/2017 04/18/17   Jola Schmidt, MD    Family History Family History  Problem Relation Age of Onset  . Hypertension Mother     Social History Social  History   Tobacco Use  . Smoking status: Current Every Day Smoker    Packs/day: 0.50    Types: Cigarettes  . Smokeless tobacco: Never Used  Substance Use Topics  . Alcohol use: No  . Drug use: Yes    Types: Marijuana, Cocaine, IV    Comment: daily use of heroin     Allergies   Patient has no known allergies.   Review of Systems Review of Systems  Unable to perform ROS: Mental status change     Physical Exam Updated Vital Signs BP 133/84 (BP Location: Left Arm)   Pulse 89   Resp 16   SpO2 97%   Physical Exam  Constitutional: She appears well-developed.  Chronically ill-appearing  HENT:  Head: Normocephalic and atraumatic.  Eyes: EOM are normal.  Neck: Normal range of motion.  Cardiovascular: Regular rhythm and normal heart sounds.  Tachycardia  Pulmonary/Chest: Effort normal and breath sounds normal.  Abdominal: Soft. She exhibits no distension. There is no tenderness.  Musculoskeletal: Normal range of motion.  Neurological:  Localizes to pain.  Moves all 4 extremities spontaneously  Skin: Skin is warm and dry.  Psychiatric: She has a normal mood and affect. Judgment normal.  Nursing note and vitals reviewed.    ED Treatments / Results  Labs (all labs ordered are listed, but only abnormal results are displayed)  Labs Reviewed  CBC - Abnormal; Notable for the following components:      Result Value   WBC 12.2 (*)    Hemoglobin 11.6 (*)    HCT 35.3 (*)    All other components within normal limits  BASIC METABOLIC PANEL - Abnormal; Notable for the following components:   Glucose, Bld 113 (*)    Calcium 8.5 (*)    All other components within normal limits  ETHANOL    EKG  EKG Interpretation  Date/Time:  Saturday May 21 2017 00:32:12 EST Ventricular Rate:  137 PR Interval:    QRS Duration: 64 QT Interval:  277 QTC Calculation: 419 R Axis:   79 Text Interpretation:  Sinus tachycardia LAE, consider biatrial enlargement Probable LVH with  secondary repol abnrm No significant change was found Confirmed by Jola Schmidt [phone]) on 05/21/2017 3:34:23 AM       Radiology No results found.  Procedures Procedures (including critical care time)  Medications Ordered in ED Medications  sodium chloride 0.9 % bolus 1,000 mL (0 mLs Intravenous Stopped 05/21/17 0200)  fentaNYL (SUBLIMAZE) injection 50 mcg (50 mcg Intravenous Given 05/21/17 0021)     Initial Impression / Assessment and Plan / ED Course  I have reviewed the triage vital signs and the nursing notes.  Pertinent labs & imaging results that were available during my care of the patient were reviewed by me and considered in my medical decision making (see chart for details).     Patient presents in what appears to be acute opioid withdrawal she is a chronic user of opioids and was given Narcan by her boyfriend.  She was given low-dose fentanyl with significant improvement in her symptoms.  She was observed in the emergency department and feels much better at time of discharge.  Discharged home in good condition.  Primary care follow-up.  Final Clinical Impressions(s) / ED Diagnoses   Final diagnoses:  Polysubstance abuse [facility])  Dehydration    ED Discharge Orders    None       Jola Schmidt, MD 05/22/17 [phone]

## 2017-05-21 NOTE — ED Notes (Signed)
She is resting comfortably (sleeping). Her skin is normal, warm and dry and she is breathing normally.

## 2017-07-28 ENCOUNTER — Encounter (HOSPITAL_COMMUNITY): Payer: Self-pay | Admitting: Emergency Medicine

## 2017-07-28 ENCOUNTER — Other Ambulatory Visit: Payer: Self-pay

## 2017-07-28 DIAGNOSIS — F17210 Nicotine dependence, cigarettes, uncomplicated: Secondary | ICD-10-CM | POA: Insufficient documentation

## 2017-07-28 DIAGNOSIS — R350 Frequency of micturition: Secondary | ICD-10-CM | POA: Insufficient documentation

## 2017-07-28 DIAGNOSIS — N10 Acute tubulo-interstitial nephritis: Secondary | ICD-10-CM | POA: Insufficient documentation

## 2017-07-28 DIAGNOSIS — F1120 Opioid dependence, uncomplicated: Secondary | ICD-10-CM | POA: Insufficient documentation

## 2017-07-28 LAB — URINALYSIS, ROUTINE W REFLEX MICROSCOPIC
Bilirubin Urine: NEGATIVE
Glucose, UA: NEGATIVE mg/dL
Ketones, ur: NEGATIVE mg/dL
Nitrite: POSITIVE — AB
PROTEIN: 30 mg/dL — AB
SPECIFIC GRAVITY, URINE: 1.015 (ref 1.005–1.030)
pH: 6 (ref 5.0–8.0)

## 2017-07-28 NOTE — ED Triage Notes (Signed)
Pt complaint of right flank pain onset yesterday; hx of same with UTI.

## 2017-07-29 ENCOUNTER — Emergency Department (HOSPITAL_COMMUNITY)
Admission: EM | Admit: 2017-07-29 | Discharge: 2017-07-29 | Disposition: A | Discharge status: Home / Self Care | Payer: Self-pay | Attending: Emergency Medicine | Admitting: Emergency Medicine

## 2017-07-29 DIAGNOSIS — N12 Tubulo-interstitial nephritis, not specified as acute or chronic: Secondary | ICD-10-CM

## 2017-07-29 LAB — PREGNANCY, URINE: Preg Test, Ur: NEGATIVE

## 2017-07-29 MED ORDER — CEPHALEXIN 500 MG PO CAPS: 1000.0000 mg | ORAL_CAPSULE | Freq: Once | ORAL | Status: DC

## 2017-07-29 MED ORDER — CIPROFLOXACIN HCL 500 MG PO TABS: 500.0000 mg | ORAL_TABLET | Freq: Two times a day (BID) | ORAL | 0 refills | Status: DC

## 2017-07-29 MED ORDER — CIPROFLOXACIN HCL 500 MG PO TABS
500.0000 mg | ORAL_TABLET | Freq: Once | ORAL | Status: AC
  Administered 2017-07-29: 500 mg via ORAL
  Filled 2017-07-29: qty 1

## 2017-07-29 NOTE — ED Provider Notes (Signed)
[place] DEPT Provider Note: Georgena Spurling, MD, FACEP  CSN: [id number] MRN: [id number] ARRIVAL: 07/28/17 at [place]  Flank Pain   HISTORY OF PRESENT ILLNESS  07/29/17 6:14 AM Hailey Perez is a 42 y.o. female with a history of heroin abuse.  She is here with right flank pain that began yesterday.  It has been associated with urinary frequency and voiding of small amounts.  She denies burning with urination.  She rated her flank pain as a 10 out of 10 on arrival.  It is worse with movement or palpation.  Pain is like that of previous urinary tract infections.  She denies fever, chills, nausea, vomiting or diarrhea.    Past Medical History:  Diagnosis Date  . Substance abuse [facility])     Past Surgical History:  Procedure Laterality Date  . I&D EXTREMITY Right 12/12/2014   Procedure: IRRIGATION AND DEBRIDEMENT EXTREMITY;  Surgeon: Dayna Barker, MD;  Location: [facility];  Service: Plastics;  Laterality: Right;  . TUBAL LIGATION    . TUBAL LIGATION      Family History  Problem Relation Age of Onset  . Hypertension Mother     Social History   Tobacco Use  . Smoking status: Current Every Day Smoker    Packs/day: 0.50    Types: Cigarettes  . Smokeless tobacco: Never Used  Substance Use Topics  . Alcohol use: No  . Drug use: Yes    Types: Marijuana, Cocaine, IV    Comment: daily use of heroin    Prior to Admission medications   Medication Sig Start Date End Date Taking? Authorizing Provider  doxycycline (VIBRAMYCIN) 100 MG capsule Take 1 capsule (100 mg total) 2 (two) times daily by mouth. Patient not taking: Reported on 05/21/2017 04/18/17   Jola Schmidt, MD    Allergies Patient has no known allergies.   REVIEW OF SYSTEMS  Negative except as noted here or in the History of Present Illness.   PHYSICAL EXAMINATION  Initial Vital Signs Blood pressure 119/78, pulse 85, temperature 98.6 F (37 C), temperature source Oral, resp.  rate 18, last menstrual period 07/02/2017, SpO2 100 %.  Examination General: Well-developed, cachectic female in no acute distress; appearance consistent with age of record HENT: normocephalic; atraumatic Eyes: pupils equal, round and reactive to light; extraocular muscles intact Neck: supple Heart: regular rate and rhythm Lungs: clear to auscultation bilaterally Abdomen: soft; nondistended; nontender; no masses or hepatosplenomegaly; bowel sounds present GU: Right CVA tenderness Extremities: No deformity; full range of motion; pulses normal Neurologic: Awake, alert and oriented; motor function intact in all extremities and symmetric; no facial droop Skin: Warm and dry Psychiatric: Normal mood and affect   RESULTS  Summary of this visit's results, reviewed by myself:   EKG Interpretation  Date/Time:    Ventricular Rate:    PR Interval:    QRS Duration:   QT Interval:    QTC Calculation:   R Axis:     Text Interpretation:        Laboratory Studies: Results for orders placed or performed during the hospital encounter of 07/29/17 (from the past 24 hour(s))  Urinalysis, Routine w reflex microscopic- may I&O cath if menses     Status: Abnormal   Collection Time: 07/28/17  7:09 PM  Result Value Ref Range   Color, Urine YELLOW YELLOW   APPearance HAZY (A) CLEAR   Specific Gravity, Urine 1.015 1.005 - 1.030   pH 6.0 5.0 - 8.0  Glucose, UA NEGATIVE NEGATIVE mg/dL   Hgb urine dipstick SMALL (A) NEGATIVE   Bilirubin Urine NEGATIVE NEGATIVE   Ketones, ur NEGATIVE NEGATIVE mg/dL   Protein, ur 30 (A) NEGATIVE mg/dL   Nitrite POSITIVE (A) NEGATIVE   Leukocytes, UA SMALL (A) NEGATIVE   RBC / HPF 0-5 0 - 5 RBC/hpf   WBC, UA 6-30 0 - 5 WBC/hpf   Bacteria, UA MANY (A) NONE SEEN   Squamous Epithelial / LPF 6-30 (A) NONE SEEN  Pregnancy, urine     Status: None   Collection Time: 07/29/17  6:10 AM  Result Value Ref Range   Preg Test, Ur NEGATIVE NEGATIVE   Imaging Studies: No  results found.  ED COURSE  Nursing notes and initial vitals signs, including pulse oximetry, reviewed.  Vitals:   07/28/17 1904 07/28/17 2346 07/29/17 0647  BP: 123/89 119/78 109/74  Pulse: 87 85 79  Resp: 18 18 18   Temp: 98.7 F (37.1 C) 98.6 F (37 C)   TempSrc: Oral Oral   SpO2: 100% 100% 100%   We will treat for pyelonephritis given flank pain and obvious UTI by urinalysis.  Urine sent for culture.  PROCEDURES    ED DIAGNOSES     ICD-10-CM   1. Pyelonephritis N12        Joshaua Epple, MD 07/29/17 [phone]

## 2017-07-30 ENCOUNTER — Emergency Department (HOSPITAL_COMMUNITY)
Admission: EM | Admit: 2017-07-30 | Discharge: 2017-07-30 | Disposition: A | Discharge status: Home / Self Care | Payer: Self-pay | Attending: Emergency Medicine | Admitting: Emergency Medicine

## 2017-07-30 ENCOUNTER — Encounter (HOSPITAL_COMMUNITY): Payer: Self-pay | Admitting: Emergency Medicine

## 2017-07-30 DIAGNOSIS — N764 Abscess of vulva: Secondary | ICD-10-CM | POA: Insufficient documentation

## 2017-07-30 DIAGNOSIS — N898 Other specified noninflammatory disorders of vagina: Secondary | ICD-10-CM

## 2017-07-30 DIAGNOSIS — F17210 Nicotine dependence, cigarettes, uncomplicated: Secondary | ICD-10-CM | POA: Insufficient documentation

## 2017-07-30 MED ORDER — CEPHALEXIN 500 MG PO CAPS: 500.0000 mg | ORAL_CAPSULE | Freq: Four times a day (QID) | ORAL | 0 refills | Status: DC

## 2017-07-30 MED ORDER — LIDOCAINE HCL (PF) 1 % IJ SOLN
5.0000 mL | Freq: Once | INTRAMUSCULAR | Status: AC
  Administered 2017-07-30: 5 mL via INTRADERMAL
  Filled 2017-07-30: qty 30

## 2017-07-30 NOTE — ED Triage Notes (Signed)
Patient reports vaginal abscess that been painful over past couple days. Reports has had some yellow drainage.

## 2017-07-30 NOTE — Discharge Instructions (Signed)
You were seen in the emergency department for a vaginal abscess and vaginal discharge.  You agreed to testing for STDs and those tests are pending.  We will call you if there is any positive tests.  You also had an incision and drainage of a small abscess and we are sending you home with antibiotics.  You should do sitz baths to allow the area to flush.  Please return if any fever or worsening redness or pain.

## 2017-07-30 NOTE — ED Provider Notes (Signed)
[facility] DEPT Provider Note   CSN: [id number] Arrival date & time: 07/30/17  1352     History   Chief Complaint Chief Complaint  Patient presents with  . vaginal abscess    HPI Hailey Perez is a 42 y.o. female.  She presents to the emergency department complaining of a boil along the side of her labia on the right side.  She is tried warm compresses and squeezing it is gotten a little bit of fluid out of it.  It is associated with some vaginal discharge.  She had her tubes tied denies that she could be pregnant.  She does not endorse any fevers nausea no vaginal bleeding.  She is active with substance abuse.  She denies any fever.  The history is provided by the patient.  Abscess  Location:  Pelvis Pelvic abscess location:  Vulva Abscess quality: painful   Progression:  Unchanged Pain details:    Quality:  Throbbing   Severity:  Severe   Timing:  Constant   Progression:  Unchanged Chronicity:  New Context: injected drug use   Relieved by:  Nothing Worsened by:  Nothing Associated symptoms: no anorexia, no fatigue, no fever, no headaches, no nausea and no vomiting     Past Medical History:  Diagnosis Date  . Substance abuse [facility])     Patient Active Problem List   Diagnosis Date Noted  . Abscess of forearm, right 12/12/2014    Past Surgical History:  Procedure Laterality Date  . I&D EXTREMITY Right 12/12/2014   Procedure: IRRIGATION AND DEBRIDEMENT EXTREMITY;  Surgeon: Dayna Barker, MD;  Location: [facility];  Service: Plastics;  Laterality: Right;  . TUBAL LIGATION    . TUBAL LIGATION      OB History    No data available       Home Medications    Prior to Admission medications   Medication Sig Start Date End Date Taking? Authorizing Provider  ciprofloxacin (CIPRO) 500 MG tablet Take 1 tablet (500 mg total) by mouth 2 (two) times daily. One po bid x 7 days 07/29/17  Yes Molpus, John, MD    Family History Family History    Problem Relation Age of Onset  . Hypertension Mother     Social History Social History   Tobacco Use  . Smoking status: Current Every Day Smoker    Packs/day: 0.50    Types: Cigarettes  . Smokeless tobacco: Never Used  Substance Use Topics  . Alcohol use: No  . Drug use: Yes    Types: Marijuana, Cocaine, IV    Comment: daily use of heroin     Allergies   Patient has no known allergies.   Review of Systems Review of Systems  Constitutional: Negative for chills, fatigue and fever.  HENT: Negative for ear pain and sore throat.   Eyes: Negative for pain and visual disturbance.  Respiratory: Negative for cough and shortness of breath.   Cardiovascular: Negative for chest pain and palpitations.  Gastrointestinal: Negative for abdominal pain, anorexia, nausea and vomiting.  Genitourinary: Positive for vaginal discharge. Negative for dysuria and hematuria.  Musculoskeletal: Negative for arthralgias and back pain.  Skin: Negative for color change and rash.  Neurological: Negative for seizures, syncope and headaches.  All other systems reviewed and are negative.    Physical Exam Updated Vital Signs BP (!) 139/95   Pulse 90   Temp 97.8 F (36.6 C) (Oral)   Resp 18   LMP 07/02/2017  SpO2 100%   Physical Exam  Constitutional: She appears well-developed and well-nourished.  HENT:  Head: Normocephalic and atraumatic.  Eyes: Conjunctivae are normal.  Neck: Neck supple.  Cardiovascular: Normal rate and regular rhythm.  Pulmonary/Chest: Effort normal and breath sounds normal.  Abdominal: Soft. Bowel sounds are normal. There is no tenderness.  Genitourinary: Uterus normal.    There is tenderness on the right labia. Cervix exhibits no motion tenderness. Right adnexum displays no mass and no tenderness. Left adnexum displays no mass and no tenderness. No foreign body in the vagina. Vaginal discharge (thin white) found.  Musculoskeletal: She exhibits no tenderness or  deformity.  Neurological: She is alert. GCS eye subscore is 4. GCS verbal subscore is 5. GCS motor subscore is 6.  Skin: Skin is warm and dry. Capillary refill takes less than 2 seconds.  Psychiatric: She has a normal mood and affect.  pelvic and I and d with chaperone tech.    ED Treatments / Results  Labs (all labs ordered are listed, but only abnormal results are displayed) Labs Reviewed - No data to display  EKG  EKG Interpretation None       Radiology No results found.  Procedures .Marland KitchenIncision and Drainage Date/Time: 07/30/2017 5:31 PM Performed by: Hayden Rasmussen, MD Authorized by: Hayden Rasmussen, MD   Consent:    Consent obtained:  Verbal   Consent given by:  Patient   Risks discussed:  Bleeding, incomplete drainage, pain and infection   Alternatives discussed:  No treatment, delayed treatment and referral Location:    Type:  Abscess   Size:  2x2   Location:  Anogenital   Anogenital location:  Vulva Pre-procedure details:    Skin preparation:  Betadine Anesthesia (see MAR for exact dosages):    Anesthesia method:  Local infiltration   Local anesthetic:  Lidocaine 1% w/o epi Procedure type:    Complexity:  Simple Procedure details:    Incision types:  Elliptical   Incision depth:  Dermal   Scalpel blade:  15   Wound management:  Probed and deloculated   Drainage:  Bloody   Drainage amount:  Scant   Wound treatment:  Wound left open Post-procedure details:    Patient tolerance of procedure:  Tolerated well, no immediate complications    (including critical care time)  Medications Ordered in ED Medications - No data to display   Initial Impression / Assessment and Plan / ED Course  I have reviewed the triage vital signs and the nursing notes.  Pertinent labs & imaging results that were available during my care of the patient were reviewed by me and considered in my medical decision making (see chart for details). She consented for std testing,  understands results not available today.      Final Clinical Impressions(s) / ED Diagnoses   Final diagnoses:  Vaginal discharge  Abscess, vulva    ED Discharge Orders        Ordered    cephALEXin (KEFLEX) 500 MG capsule  4 times daily     07/30/17 1735       Hayden Rasmussen, MD 08/01/17 [phone]

## 2017-07-31 LAB — RPR: RPR Ser Ql: NONREACTIVE

## 2017-07-31 LAB — URINE CULTURE

## 2017-07-31 LAB — HIV ANTIBODY (ROUTINE TESTING W REFLEX): HIV Screen 4th Generation wRfx: NONREACTIVE

## 2017-08-01 LAB — GC/CHLAMYDIA PROBE AMP (~~LOC~~) NOT AT ARMC
Chlamydia: NEGATIVE
Neisseria Gonorrhea: NEGATIVE

## 2018-03-08 ENCOUNTER — Emergency Department (HOSPITAL_COMMUNITY)
Admission: EM | Admit: 2018-03-08 | Discharge: 2018-03-09 | Disposition: A | Discharge status: Home / Self Care | Payer: Medicaid Other | Attending: Emergency Medicine | Admitting: Emergency Medicine

## 2018-03-08 ENCOUNTER — Encounter (HOSPITAL_COMMUNITY): Payer: Self-pay | Admitting: Emergency Medicine

## 2018-03-08 DIAGNOSIS — F17210 Nicotine dependence, cigarettes, uncomplicated: Secondary | ICD-10-CM | POA: Insufficient documentation

## 2018-03-08 DIAGNOSIS — L0211 Cutaneous abscess of neck: Secondary | ICD-10-CM | POA: Insufficient documentation

## 2018-03-08 MED ORDER — CLINDAMYCIN HCL 150 MG PO CAPS: 150.0000 mg | ORAL_CAPSULE | Freq: Four times a day (QID) | ORAL | 0 refills | Status: DC

## 2018-03-08 MED ORDER — LIDOCAINE HCL (PF) 1 % IJ SOLN
5.0000 mL | Freq: Once | INTRAMUSCULAR | Status: AC
  Administered 2018-03-08
  Filled 2018-03-08: qty 30

## 2018-03-08 MED ORDER — CLINDAMYCIN HCL 300 MG PO CAPS
300.0000 mg | ORAL_CAPSULE | Freq: Once | ORAL | Status: AC
  Administered 2018-03-09: 300 mg via ORAL
  Filled 2018-03-08: qty 1

## 2018-03-08 NOTE — ED Provider Notes (Signed)
[facility] DEPT Provider Note   CSN: [id number] Arrival date & time: 03/08/18  1810     History   Chief Complaint Chief Complaint  Patient presents with  . neck swelling  . Abscess    HPI Hailey Perez is a 42 y.o. female.  She presents with an abscess on her left lateral neck is been there for a few days.  She admits that it was from injecting IV heroin in that area.  She has had abscesses before.  She denies any fever.  She has a moderate amount of pain increased with any movement of that area or palpation.  She does not strain just a minimal bit of fluid.  The history is provided by the patient.  Abscess  Location:  Head/neck Head/neck abscess location:  L neck Size:  5 Abscess quality: fluctuance, induration, painful, redness and warmth   Abscess quality: not draining   Progression:  Worsening Pain details:    Quality:  Pressure and throbbing   Severity:  Moderate   Timing:  Constant   Progression:  Worsening Chronicity:  New Context: injected drug use   Relieved by:  Nothing Worsened by:  Draining/squeezing Associated symptoms: no fever and no headaches   Risk factors: prior abscess   Risk factors: no hx of MRSA     Past Medical History:  Diagnosis Date  . Substance abuse [facility])     Patient Active Problem List   Diagnosis Date Noted  . Abscess of forearm, right 12/12/2014    Past Surgical History:  Procedure Laterality Date  . I&D EXTREMITY Right 12/12/2014   Procedure: IRRIGATION AND DEBRIDEMENT EXTREMITY;  Surgeon: Dayna Barker, MD;  Location: [facility];  Service: Plastics;  Laterality: Right;  . TUBAL LIGATION    . TUBAL LIGATION       OB History   None      Home Medications    Prior to Admission medications   Medication Sig Start Date End Date Taking? Authorizing Provider  Aspirin-Caffeine (BC FAST PAIN RELIEF PO) Take 2 packets by mouth every 12 (twelve) hours as needed (pain).   Yes [provider]    cephALEXin (KEFLEX) 500 MG capsule Take 1 capsule (500 mg total) by mouth 4 (four) times daily. Patient not taking: Reported on 03/08/2018 07/30/17   Hayden Rasmussen, MD  ciprofloxacin (CIPRO) 500 MG tablet Take 1 tablet (500 mg total) by mouth 2 (two) times daily. One po bid x 7 days Patient not taking: Reported on 03/08/2018 07/29/17   Molpus, Jenny Reichmann, MD    Family History Family History  Problem Relation Age of Onset  . Hypertension Mother     Social History Social History   Tobacco Use  . Smoking status: Current Every Day Smoker    Packs/day: 0.50    Types: Cigarettes  . Smokeless tobacco: Never Used  Substance Use Topics  . Alcohol use: No  . Drug use: Yes    Types: Marijuana, Cocaine, IV    Comment: daily use of heroin     Allergies   Patient has no known allergies.   Review of Systems Review of Systems  Constitutional: Negative for fever.  HENT: Negative for sore throat.   Eyes: Negative for visual disturbance.  Respiratory: Negative for shortness of breath.   Cardiovascular: Negative for chest pain.  Gastrointestinal: Negative for abdominal pain.  Genitourinary: Negative for dysuria.  Musculoskeletal: Positive for neck pain.  Skin: Positive for wound. Negative for rash.  Neurological: Negative for headaches.     Physical Exam Updated Vital Signs BP 109/77 (BP Location: Left Arm)   Pulse 93   Temp 98 F (36.7 C) (Oral)   Resp 18   Ht 5' (1.524 m)   Wt 48.3 kg   LMP 02/21/2018   SpO2 99%   BMI 20.81 kg/m   Physical Exam  Constitutional: She is oriented to person, place, and time. She appears well-developed and well-nourished.  HENT:  Head: Normocephalic and atraumatic.  Eyes: Conjunctivae are normal.  Neck:  Proximate 5 cm red tender indurated fluctuant mass on the lateral side of her left neck.  There is some associated lymphadenopathy.  No crepitus.  Not pulsatile.  Cardiovascular: Normal rate, regular rhythm and normal heart sounds.  No  murmur heard. Pulmonary/Chest: Effort normal. No stridor. She has no wheezes. She has no rales.  Abdominal: Soft. There is no tenderness. There is no guarding.  Musculoskeletal: Normal range of motion. She exhibits no edema, tenderness or deformity.  Neurological: She is alert and oriented to person, place, and time. GCS eye subscore is 4. GCS verbal subscore is 5. GCS motor subscore is 6.  Skin: Skin is warm and dry. Capillary refill takes less than 2 seconds.  Psychiatric: She has a normal mood and affect.  Nursing note and vitals reviewed.    ED Treatments / Results  Labs (all labs ordered are listed, but only abnormal results are displayed) Labs Reviewed - No data to display  EKG None  Radiology No results found.  Procedures .Marland KitchenIncision and Drainage Date/Time: 03/08/2018 11:18 PM Performed by: Hayden Rasmussen, MD Authorized by: Hayden Rasmussen, MD   Consent:    Consent obtained:  Verbal   Consent given by:  Patient   Risks discussed:  Bleeding, incomplete drainage, pain and infection   Alternatives discussed:  No treatment, delayed treatment and referral Location:    Type:  Abscess   Size:  5   Location:  Neck   Neck location:  L anterior Pre-procedure details:    Skin preparation:  Betadine Anesthesia (see MAR for exact dosages):    Anesthesia method:  Local infiltration   Local anesthetic:  Lidocaine 1% w/o epi Procedure type:    Complexity:  Simple Procedure details:    Incision types:  Single straight   Scalpel blade:  15   Wound management:  Probed and deloculated   Drainage:  Purulent   Drainage amount:  Moderate   Packing materials:  1/2 in iodoform gauze Post-procedure details:    Patient tolerance of procedure:  Tolerated well, no immediate complications   (including critical care time) EMERGENCY DEPARTMENT US SOFT TISSUE INTERPRETATION "Study: Limited Soft Tissue Ultrasound"  INDICATIONS: Soft tissue infection Multiple views of the body part  were obtained in real-time with a multi-frequency linear probe  PERFORMED BY: Myself IMAGES ARCHIVED?: Yes SIDE:Left BODY PART:Neck INTERPRETATION:  Abcess present   Medications Ordered in ED Medications  lidocaine (PF) (XYLOCAINE) 1 % injection 5 mL (has no administration in time range)  clindamycin (CLEOCIN) capsule 300 mg (has no administration in time range)     Initial Impression / Assessment and Plan / ED Course  I have reviewed the triage vital signs and the nursing notes.  Pertinent labs & imaging results that were available during my care of the patient were reviewed by me and considered in my medical decision making (see chart for details).      Final Clinical Impressions(s) / ED Diagnoses  Final diagnoses:  Neck abscess    ED Discharge Orders         Ordered    clindamycin (CLEOCIN) 150 MG capsule  Every 6 hours     03/08/18 2322           Hayden Rasmussen, MD 03/09/18 [phone]

## 2018-03-08 NOTE — ED Notes (Signed)
Pt in lobby bathroom

## 2018-03-08 NOTE — Discharge Instructions (Addendum)
You were evaluated in the emergency department for an abscess on your neck.  We did a local incision and drainage of that area.  We are placing you on some oral antibiotics.  Please return in 2 days for wound recheck and wick removal.  Return sooner if any concerns.

## 2018-03-08 NOTE — ED Triage Notes (Signed)
Pt c/o red, swollen abscess on left side of neck for several days. Denies drainage.

## 2018-07-10 ENCOUNTER — Encounter (HOSPITAL_COMMUNITY): Payer: Self-pay

## 2018-07-10 ENCOUNTER — Emergency Department (HOSPITAL_COMMUNITY)
Admission: EM | Admit: 2018-07-10 | Discharge: 2018-07-11 | Disposition: A | Discharge status: Home / Self Care | Payer: Medicaid Other | Attending: Emergency Medicine | Admitting: Emergency Medicine

## 2018-07-10 ENCOUNTER — Other Ambulatory Visit: Payer: Self-pay

## 2018-07-10 DIAGNOSIS — R109 Unspecified abdominal pain: Secondary | ICD-10-CM | POA: Insufficient documentation

## 2018-07-10 DIAGNOSIS — K5900 Constipation, unspecified: Secondary | ICD-10-CM | POA: Insufficient documentation

## 2018-07-10 DIAGNOSIS — Z5321 Procedure and treatment not carried out due to patient leaving prior to being seen by health care provider: Secondary | ICD-10-CM | POA: Insufficient documentation

## 2018-07-10 LAB — COMPREHENSIVE METABOLIC PANEL
ALT: 34 U/L (ref 0–44)
AST: 27 U/L (ref 15–41)
Albumin: 3 g/dL — ABNORMAL LOW (ref 3.5–5.0)
Alkaline Phosphatase: 61 U/L (ref 38–126)
Anion gap: 8 (ref 5–15)
BUN: 8 mg/dL (ref 6–20)
CO2: 27 mmol/L (ref 22–32)
CREATININE: 0.61 mg/dL (ref 0.44–1.00)
Calcium: 8.6 mg/dL — ABNORMAL LOW (ref 8.9–10.3)
Chloride: 101 mmol/L (ref 98–111)
GFR calc Af Amer: >60 mL/min (ref >60)
GFR calc non Af Amer: >60 mL/min (ref >60)
Glucose, Bld: 95 mg/dL (ref 70–99)
Potassium: 4 mmol/L (ref 3.5–5.1)
SODIUM: 136 mmol/L (ref 135–145)
Total Bilirubin: <0.1 mg/dL — ABNORMAL LOW (ref 0.3–1.2)
Total Protein: 7.2 g/dL (ref 6.5–8.1)

## 2018-07-10 LAB — CBC
HEMATOCRIT: 38.6 % (ref 36.0–46.0)
HEMOGLOBIN: 12.1 g/dL (ref 12.0–15.0)
MCH: 26.1 pg (ref 26.0–34.0)
MCHC: 31.3 g/dL (ref 30.0–36.0)
MCV: 83.4 fL (ref 80.0–100.0)
Platelets: 281 10*3/uL (ref 150–400)
RBC: 4.63 MIL/uL (ref 3.87–5.11)
RDW: 13.2 % (ref 11.5–15.5)
WBC: 8.7 10*3/uL (ref 4.0–10.5)
nRBC: 0 % (ref 0.0–0.2)

## 2018-07-10 LAB — LIPASE, BLOOD: LIPASE: 26 U/L (ref 11–51)

## 2018-07-10 MED ORDER — SODIUM CHLORIDE 0.9% FLUSH: 3.0000 mL | Freq: Once | INTRAVENOUS | Status: DC

## 2018-07-10 NOTE — ED Triage Notes (Signed)
Patient states she has upper abdominal pain x 2 weeks.  Patient states she went 2 weeks before having a BM and was taking OTc oral meds, enema, and lactose during this time period. Patient states she had a small amount of "runny " stool yesterday.

## 2018-07-19 ENCOUNTER — Encounter (HOSPITAL_BASED_OUTPATIENT_CLINIC_OR_DEPARTMENT_OTHER): Payer: Self-pay

## 2018-07-19 ENCOUNTER — Emergency Department (HOSPITAL_BASED_OUTPATIENT_CLINIC_OR_DEPARTMENT_OTHER)
Admission: EM | Admit: 2018-07-19 | Discharge: 2018-07-19 | Disposition: A | Discharge status: Home / Self Care | Payer: Self-pay | Attending: Emergency Medicine | Admitting: Emergency Medicine

## 2018-07-19 ENCOUNTER — Emergency Department (HOSPITAL_BASED_OUTPATIENT_CLINIC_OR_DEPARTMENT_OTHER): Payer: Self-pay

## 2018-07-19 ENCOUNTER — Other Ambulatory Visit: Payer: Self-pay

## 2018-07-19 DIAGNOSIS — F17210 Nicotine dependence, cigarettes, uncomplicated: Secondary | ICD-10-CM | POA: Insufficient documentation

## 2018-07-19 DIAGNOSIS — K5900 Constipation, unspecified: Secondary | ICD-10-CM

## 2018-07-19 DIAGNOSIS — R1902 Left upper quadrant abdominal swelling, mass and lump: Secondary | ICD-10-CM

## 2018-07-19 MED ORDER — POLYETHYLENE GLYCOL 3350 17 G PO PACK: 17.0000 g | PACK | Freq: Every day | ORAL | 0 refills | Status: AC | PRN

## 2018-07-19 MED ORDER — SENNOSIDES-DOCUSATE SODIUM 8.6-50 MG PO TABS: 1.0000 | ORAL_TABLET | Freq: Every day | ORAL | 0 refills | Status: DC | PRN

## 2018-07-19 NOTE — ED Notes (Signed)
Pt c/o abd pain and breast pain

## 2018-07-19 NOTE — ED Notes (Signed)
Pt decline urine preg for abd xray

## 2018-07-19 NOTE — Discharge Instructions (Signed)
You were seen in the ED with an abdominal mass. Your x-ray shows constipation. Call your PCP for follow up after treatment for constipation. If the mass remains, you may require additional tests which your PCP can order.
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
## 2018-07-19 NOTE — ED Notes (Signed)
ED Provider at bedside. 

## 2018-07-19 NOTE — ED Provider Notes (Signed)
Emergency Department Provider Note   I have reviewed the triage vital signs and the nursing notes.   HISTORY  Chief Complaint Breast Mass   HPI LANEE CHAIN is a 43 y.o. female with PMH of substance abuse presents to the emergency department with firmness and mass to the left upper quadrant.  Patient initial complaint was listed as breast pain but area of mass is more in the abdomen.  Patient denies any breast tenderness but does have tenderness over the mass.  No redness, fever, chills.  No injury to the area.  She has experiencing constipation and took some medication with improvement in symptoms.   Past Medical History:  Diagnosis Date  . Substance abuse [facility])     Patient Active Problem List   Diagnosis Date Noted  . Abscess of forearm, right 12/12/2014    Past Surgical History:  Procedure Laterality Date  . I&D EXTREMITY Right 12/12/2014   Procedure: IRRIGATION AND DEBRIDEMENT EXTREMITY;  Surgeon: Dayna Barker, MD;  Location: [facility];  Service: Plastics;  Laterality: Right;  . TUBAL LIGATION    . TUBAL LIGATION      Allergies Patient has no known allergies.  Family History  Problem Relation Age of Onset  . Hypertension Mother     Social History Social History   Tobacco Use  . Smoking status: Current Every Day Smoker    Packs/day: 0.50    Types: Cigarettes  . Smokeless tobacco: Never Used  Substance Use Topics  . Alcohol use: No  . Drug use: Yes    Types: IV    Comment: heroin    Review of Systems  Constitutional: No fever/chills Eyes: No visual changes. ENT: No sore throat. Cardiovascular: Denies chest pain. Respiratory: Denies shortness of breath. Gastrointestinal: Positive LUQ abdominal pain.  No nausea, no vomiting.  No diarrhea.  No constipation. Genitourinary: Negative for dysuria. Musculoskeletal: Negative for back pain. Skin: Negative for rash. Neurological: Negative for headaches, focal weakness or numbness.  10-point ROS otherwise  negative.  ____________________________________________   PHYSICAL EXAM:  VITAL SIGNS: ED Triage Vitals  Enc Vitals Group     BP 07/19/18 1343 127/82     Pulse Rate 07/19/18 1343 89     Resp 07/19/18 1343 16     Temp 07/19/18 1343 98 F (36.7 C)     Temp Source 07/19/18 1343 Oral     SpO2 07/19/18 1343 100 %     Weight 07/19/18 1343 101 lb (45.8 kg)     Height 07/19/18 1343 5' (1.524 m)     Pain Score 07/19/18 1340 6   Constitutional: Alert and oriented. Well appearing and in no acute distress. Eyes: Conjunctivae are normal. Head: Atraumatic. Nose: No congestion/rhinnorhea. Mouth/Throat: Mucous membranes are moist.  Neck: No stridor.   Cardiovascular: Normal rate, regular rhythm. Good peripheral circulation. Grossly normal heart sounds.   Respiratory: Normal respiratory effort.  No retractions. Lungs CTAB. Gastrointestinal: Soft with firm, mildly tender area in the LUQ. No erythema. No distention.  Musculoskeletal: No lower extremity tenderness nor edema. No gross deformities of extremities. Neurologic:  Normal speech and language. No gross focal neurologic deficits are appreciated.  Skin:  Skin is warm, dry and intact. No rash noted.  ____________________________________________   LABS (all labs ordered are listed, but only abnormal results are displayed)  Labs Reviewed - No data to display ____________________________________________  RADIOLOGY  Dg Abd 2 Views  Result Date: 07/19/2018 CLINICAL DATA:  LEFT upper quadrant pain. EXAM: ABDOMEN -  2 VIEW COMPARISON:  None. FINDINGS: The bowel gas pattern is normal. There is no evidence of free air. No radio-opaque calculi or other significant radiographic abnormality is seen. Moderate stool burden. IMPRESSION: Moderate stool burden.  No acute findings. Electronically Signed   By: Staci Righter M.D.   On: 07/19/2018 16:16    ____________________________________________   PROCEDURES  Procedure(s) performed:    Procedures  None ____________________________________________   INITIAL IMPRESSION / ASSESSMENT AND PLAN / ED COURSE  Pertinent labs & imaging results that were available during my care of the patient were reviewed by me and considered in my medical decision making (see chart for details).  Patient initially presenting with breast mass but on my exam this is more of a left upper quadrant tenderness with mild swelling.  No abnormality of the breast tissue.  Concern for abscess.  Will plain film and urine pregnancy.  Possibly distention from constipation but will evaluate further.  Gust that we will perform initial imaging but patient will need follow-up with her PCP for consideration of additional imaging if work-up here is negative.  Patient verbalized understanding.   Pain film with no acute findings.  Patient does have moderate stool burden.  Plan for aggressive constipation management as this could be causing the patient's symptoms and exam findings.  I advised that if this continues after constipation management she will need to follow with her PCP for additional imaging.  Patient verbalized understanding.  Discussed ED return precautions. ____________________________________________  FINAL CLINICAL IMPRESSION(S) / ED DIAGNOSES  Final diagnoses:  Abdominal mass, LUQ (left upper quadrant)  Constipation, unspecified constipation type    NEW OUTPATIENT MEDICATIONS STARTED DURING THIS VISIT:  New Prescriptions   POLYETHYLENE GLYCOL (MIRALAX) PACKET    Take 17 g by mouth daily as needed for up to 14 days for moderate constipation.   SENNA-DOCUSATE (SENOKOT-S) 8.6-50 MG TABLET    Take 1 tablet by mouth daily as needed for mild constipation or moderate constipation.    Note:  This document was prepared using Dragon voice recognition software and may include unintentional dictation errors.  Nanda Quinton, MD Emergency Medicine    Dougles Kimmey, [facility], MD 07/19/18 [phone]

## 2018-07-19 NOTE — ED Triage Notes (Signed)
Pt c/o left breast mass x 3 weeks-NAD-steady gait

## 2019-02-21 ENCOUNTER — Encounter (HOSPITAL_BASED_OUTPATIENT_CLINIC_OR_DEPARTMENT_OTHER): Payer: Self-pay

## 2019-02-21 ENCOUNTER — Other Ambulatory Visit: Payer: Self-pay

## 2019-02-21 DIAGNOSIS — F17210 Nicotine dependence, cigarettes, uncomplicated: Secondary | ICD-10-CM | POA: Insufficient documentation

## 2019-02-21 DIAGNOSIS — Z202 Contact with and (suspected) exposure to infections with a predominantly sexual mode of transmission: Secondary | ICD-10-CM | POA: Insufficient documentation

## 2019-02-21 DIAGNOSIS — F1910 Other psychoactive substance abuse, uncomplicated: Secondary | ICD-10-CM | POA: Insufficient documentation
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
## 2019-02-22 ENCOUNTER — Other Ambulatory Visit: Payer: Self-pay | Admitting: Emergency Medicine

## 2019-02-22 ENCOUNTER — Emergency Department (HOSPITAL_BASED_OUTPATIENT_CLINIC_OR_DEPARTMENT_OTHER)
Admission: EM | Admit: 2019-02-22 | Discharge: 2019-02-22 | Disposition: A | Discharge status: Home / Self Care | Payer: Medicaid Other | Attending: Emergency Medicine | Admitting: Emergency Medicine

## 2019-02-22 ENCOUNTER — Encounter (HOSPITAL_BASED_OUTPATIENT_CLINIC_OR_DEPARTMENT_OTHER): Payer: Self-pay | Admitting: Emergency Medicine

## 2019-02-22 DIAGNOSIS — Z202 Contact with and (suspected) exposure to infections with a predominantly sexual mode of transmission: Secondary | ICD-10-CM

## 2019-02-22 MED ORDER — CEFTRIAXONE SODIUM 250 MG IJ SOLR
250.0000 mg | Freq: Once | INTRAMUSCULAR | Status: AC
  Administered 2019-02-22: 03:00:00 250 mg via INTRAMUSCULAR
  Filled 2019-02-22: qty 250

## 2019-02-22 MED ORDER — AZITHROMYCIN 250 MG PO TABS
1000.0000 mg | ORAL_TABLET | Freq: Once | ORAL | Status: AC
  Administered 2019-02-22: 1000 mg via ORAL
  Filled 2019-02-22: qty 4

## 2019-02-22 NOTE — ED Notes (Signed)
ED Provider at bedside. 
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
## 2019-02-24 LAB — CYTOLOGY, (ORAL, ANAL, URETHRAL) ANCILLARY ONLY
Chlamydia: NEGATIVE
Neisseria Gonorrhea: POSITIVE — AB

## 2019-03-02 ENCOUNTER — Telehealth (HOSPITAL_COMMUNITY): Payer: Self-pay

## 2019-06-17 IMAGING — CR DG ABDOMEN 2V
2 series · 2 of 2 positions shown · non-contrast
Comparison: None.

CLINICAL DATA: LEFT upper quadrant pain.

EXAM:
ABDOMEN - 2 VIEW

[w abdomen upright]
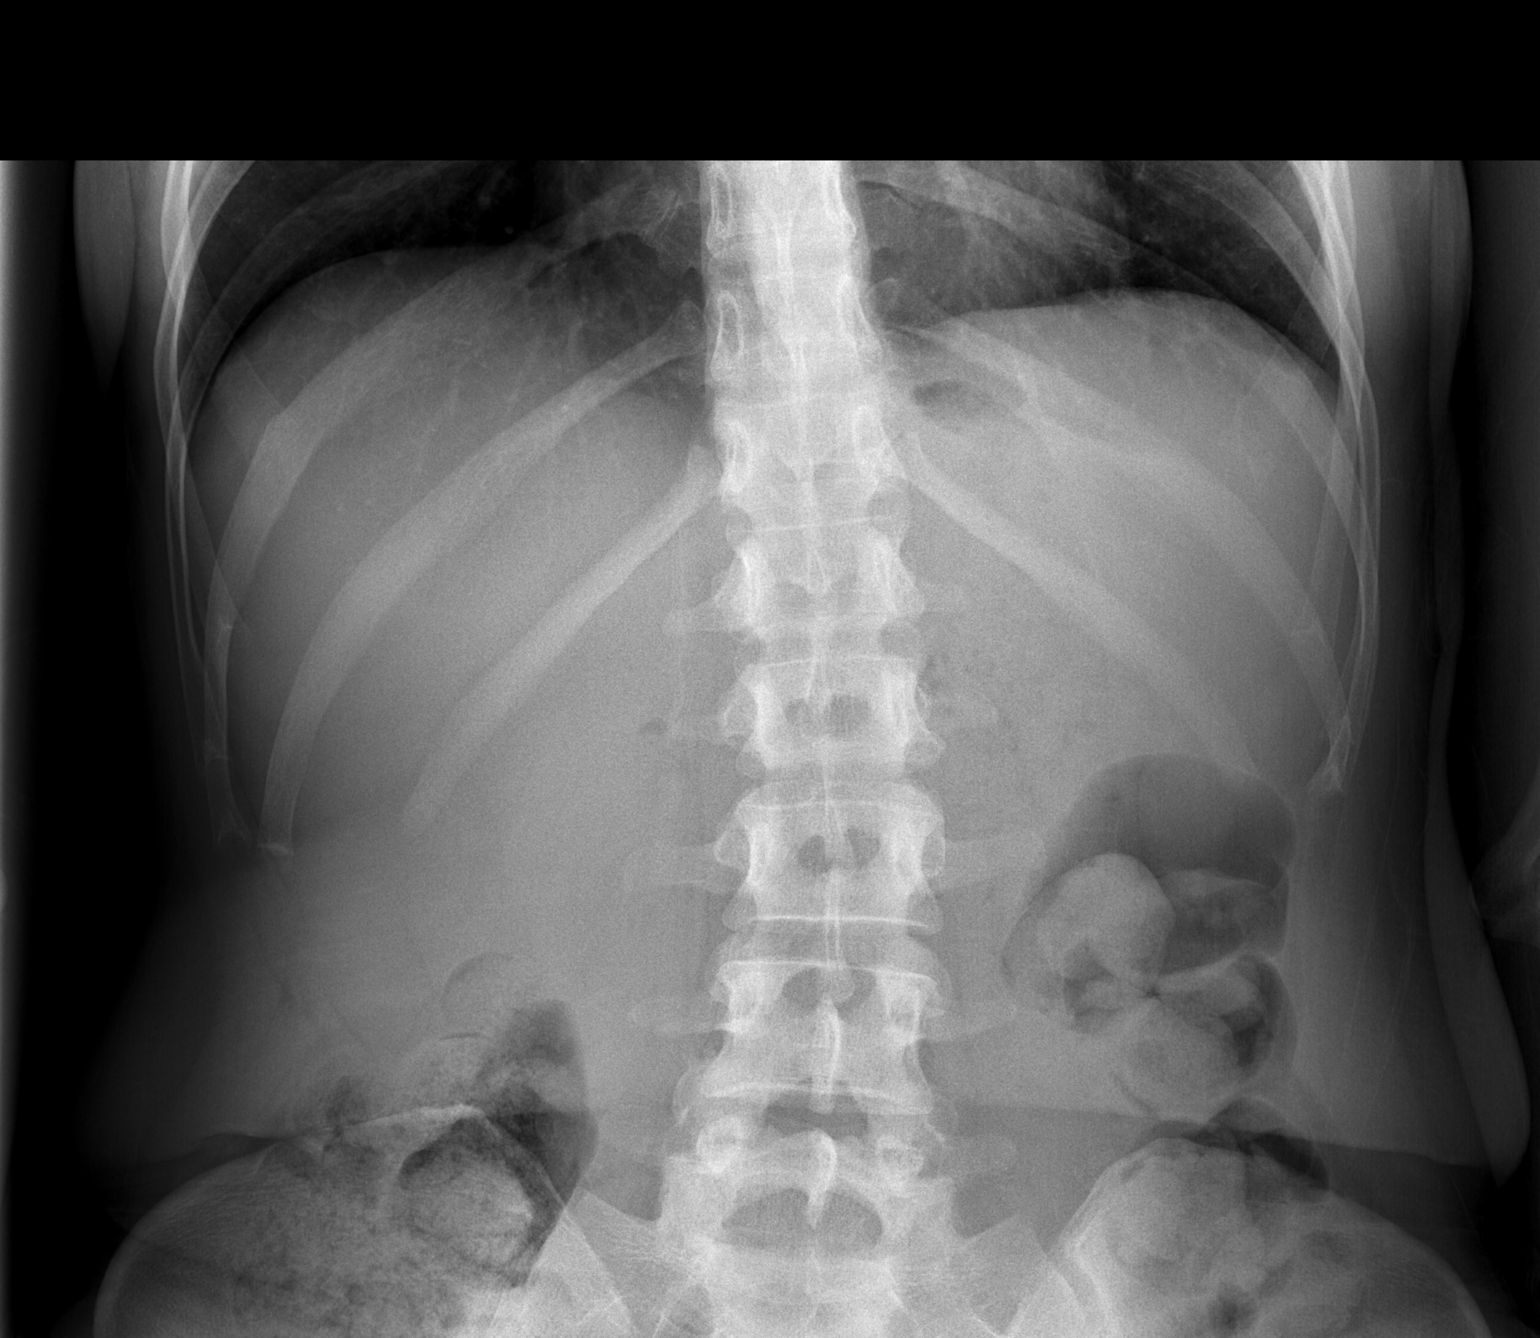

[t abdomen supine]
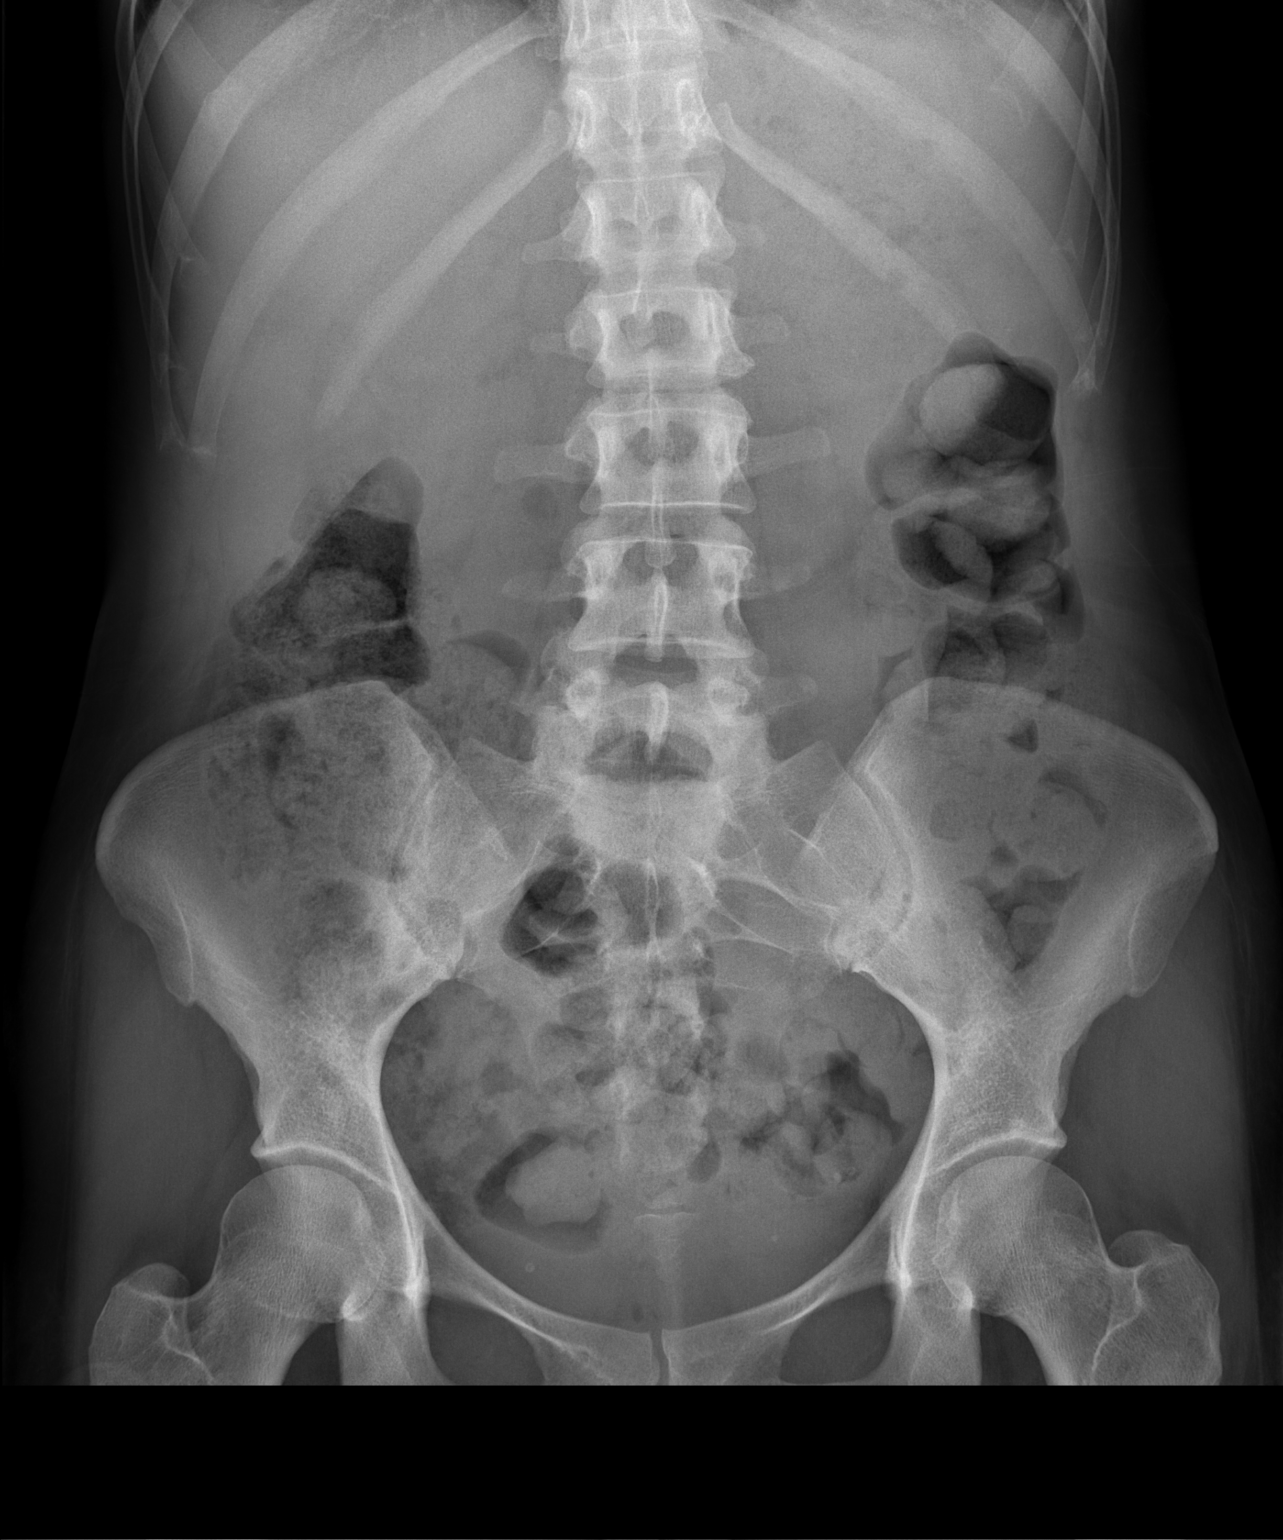

[2 of 2 positions shown; findings below may reference images not displayed]

FINDINGS: The bowel gas pattern is normal. There is no evidence of free air.
No radio-opaque calculi or other significant radiographic
abnormality is seen. Moderate stool burden.
IMPRESSION: Moderate stool burden.  No acute findings.

## 2022-07-16 ENCOUNTER — Encounter: Payer: Self-pay | Admitting: Family

## 2022-07-16 ENCOUNTER — Ambulatory Visit: Payer: Medicaid Other | Admitting: Family

## 2022-07-16 VITALS — BP 122/78 | HR 80 | Resp 18 | Ht 61.0 in | Wt 152.2 lb

## 2022-07-16 DIAGNOSIS — F1991 Other psychoactive substance use, unspecified, in remission: Secondary | ICD-10-CM | POA: Diagnosis not present

## 2022-07-16 DIAGNOSIS — Z124 Encounter for screening for malignant neoplasm of cervix: Secondary | ICD-10-CM

## 2022-07-16 DIAGNOSIS — R519 Headache, unspecified: Secondary | ICD-10-CM

## 2022-07-16 DIAGNOSIS — D179 Benign lipomatous neoplasm, unspecified: Secondary | ICD-10-CM | POA: Diagnosis not present

## 2022-07-16 NOTE — Progress Notes (Signed)
Hailey Perez is a 47 y.o. female with the following history as recorded in [employer]:  Patient Active Problem List   Diagnosis Date Noted   Abscess of forearm, right 12/12/2014    Current Outpatient Medications  Medication Sig Dispense Refill   buprenorphine-naloxone (SUBOXONE) 8-2 mg SUBL SL tablet Place 1 tablet under the tongue 2 (two) times daily.     No current facility-administered medications for this visit.    Allergies: Patient has no known allergies.  Past Medical History:  Diagnosis Date   Substance abuse [facility])     Past Surgical History:  Procedure Laterality Date   I & D EXTREMITY Right 12/12/2014   Procedure: IRRIGATION AND DEBRIDEMENT EXTREMITY;  Surgeon: Dayna Barker, MD;  Location: [facility];  Service: Plastics;  Laterality: Right;   TUBAL LIGATION      Family History  Problem Relation Age of Onset   Hypertension Mother     Social History   Tobacco Use   Smoking status: Every Day    Packs/day: 0.50    Types: Cigarettes   Smokeless tobacco: Never  Substance Use Topics   Alcohol use: No    Subjective:   Presents today as a new patient; notes she has not been to the doctor "for many years"- has recently gotten Medicaid and trying to get her health care needs up to date; requesting to have her Suboxone refilled; was working with a Suboxone clinic until yesterday- was told that she had to have her PCP take over the prescription/ that she was told she could not continue at the Suboxone clinic now that she has Medicaid;   Notes she has had problems with headaches since she was a young child- notes that headaches have been getting worse; admits that her stress level has been very high recently;   Concerned about "lump" in right forearm- -thinks area has been present for the past 6 months at least; may be increasing in size;   Notes that she is living with family member who tested positive for COVID 2 days ago- she is not having any symptoms at this time;       Objective:  Vitals:   07/16/22 1401  BP: 122/78  Pulse: 80  Resp: 18  SpO2: 98%  Weight: 152 lb 3.2 oz (69 kg)  Height: 5' 1"$  (1.549 m)    General: Well developed, well nourished, in no acute distress  Skin : Warm and dry. Small, movable lesion no right forearm Head: Normocephalic and atraumatic  Eyes: Sclera and conjunctiva clear; pupils round and reactive to light; extraocular movements intact  Ears: External normal; canals clear; tympanic membranes normal  Oropharynx: Pink, supple. No suspicious lesions  Neck: Supple without thyromegaly, adenopathy  Lungs: Respirations unlabored; clear to auscultation bilaterally without wheeze, rales, rhonchi  CVS exam: normal rate and regular rhythm.  Neurologic: Alert and oriented; speech intact; face symmetrical; moves all extremities well; CNII-XII intact without focal deficit   Assessment:  1. Cervical cancer screening   2. History of drug use   3. Lipoma, unspecified site   4. Nonintractable headache, unspecified chronicity pattern, unspecified headache type     Plan:  Will refer to GYN to establish care of pap smear/ mammogram; patient is asking for provider in [place]; Per patient, has been sober/ on Suboxone x 6 months; she was requesting that our office take over management of her Suboxone; explained to patient that I am not trained and am not able to take over management of  her Suboxone; our office manager did reach out to the [facility] resource team to let them know that patient's needs are concerning for relapse and to hopefully find her resources for management as soon as possible.  Will update right forearm ultrasound; Will update head CT; unable to do labs today due to potential COVID- she will plan to return at later date; am concerned for getting other patients sick if she tests positive for COVID in the next 24-48 hours.   No follow-ups on file.  Orders Placed This Encounter  Procedures   [place] LT UPPER EXTREM LTD  SOFT TISSUE NON VASCULAR    Standing Status:   Future    Standing Expiration Date:   07/17/2023    Order Specific Question:   Reason for Exam (SYMPTOM  OR DIAGNOSIS REQUIRED)    Answer:   suspect lipoma left arm    Order Specific Question:   Preferred imaging location?    Answer:   [facility]   CT HEAD WO CONTRAST (5MM)    Standing Status:   Future    Standing Expiration Date:   07/17/2023    Order Specific Question:   Is patient pregnant?    Answer:   No    Order Specific Question:   Preferred imaging location?    Answer:   [facility]   Ambulatory referral to Obstetrics / Gynecology    Referral Priority:   Routine    Referral Type:   Consultation    Referral Reason:   Specialty Services Required    Requested Specialty:   Obstetrics and Gynecology    Number of Visits Requested:   1   Ambulatory referral to Psychiatry    Referral Priority:   Routine    Referral Type:   Psychiatric    Referral Reason:   Specialty Services Required    Requested Specialty:   Psychiatry    Number of Visits Requested:   1    Requested Prescriptions    No prescriptions requested or ordered in this encounter

## 2022-08-23 ENCOUNTER — Other Ambulatory Visit: Payer: Medicaid Other

## 2022-09-06 ENCOUNTER — Other Ambulatory Visit: Payer: Medicaid Other

## 2022-10-11 ENCOUNTER — Ambulatory Visit
Admission: RE | Admit: 2022-10-11 | Discharge: 2022-10-11 | Disposition: A | Discharge status: Home / Self Care | Payer: Medicaid Other | Source: Ambulatory Visit | Attending: Family | Admitting: Family

## 2022-10-11 DIAGNOSIS — R519 Headache, unspecified: Secondary | ICD-10-CM

## 2022-11-02 ENCOUNTER — Other Ambulatory Visit: Payer: Self-pay | Admitting: Pulmonary Disease

## 2022-11-02 DIAGNOSIS — Z1231 Encounter for screening mammogram for malignant neoplasm of breast: Secondary | ICD-10-CM

## 2022-11-05 ENCOUNTER — Ambulatory Visit: Payer: Medicaid Other

## 2022-11-09 ENCOUNTER — Ambulatory Visit: Payer: Medicaid Other

## 2022-11-16 ENCOUNTER — Ambulatory Visit: Payer: Medicaid Other

## 2022-12-01 ENCOUNTER — Inpatient Hospital Stay: Admission: RE | Admit: 2022-12-01 | Payer: Medicaid Other | Source: Ambulatory Visit

## 2022-12-07 ENCOUNTER — Ambulatory Visit (HOSPITAL_COMMUNITY)
Admission: EM | Admit: 2022-12-07 | Discharge: 2022-12-07 | Disposition: A | Discharge status: Other Acute Inpatient Hospital | Payer: MEDICAID | Attending: Behavioral Health | Admitting: Behavioral Health

## 2022-12-07 ENCOUNTER — Encounter (HOSPITAL_COMMUNITY): Payer: Self-pay | Admitting: Behavioral Health

## 2022-12-07 DIAGNOSIS — F17210 Nicotine dependence, cigarettes, uncomplicated: Secondary | ICD-10-CM | POA: Insufficient documentation

## 2022-12-07 DIAGNOSIS — F23 Brief psychotic disorder: Secondary | ICD-10-CM | POA: Insufficient documentation

## 2022-12-07 LAB — CBC WITH DIFFERENTIAL/PLATELET
Abs Immature Granulocytes: 0.02 10*3/uL (ref 0.00–0.07)
Basophils Absolute: 0.1 10*3/uL (ref 0.0–0.1)
Basophils Relative: 1 %
Eosinophils Absolute: 0.1 10*3/uL (ref 0.0–0.5)
Eosinophils Relative: 2 %
HCT: 37.3 % (ref 36.0–46.0)
Hemoglobin: 12.4 g/dL (ref 12.0–15.0)
Immature Granulocytes: 0 %
Lymphocytes Relative: 42 %
Lymphs Abs: 2.9 10*3/uL (ref 0.7–4.0)
MCH: 27.3 pg (ref 26.0–34.0)
MCHC: 33.2 g/dL (ref 30.0–36.0)
MCV: 82 fL (ref 80.0–100.0)
Monocytes Absolute: 0.6 10*3/uL (ref 0.1–1.0)
Monocytes Relative: 9 %
Neutro Abs: 3.2 10*3/uL (ref 1.7–7.7)
Neutrophils Relative %: 46 %
Platelets: 191 10*3/uL (ref 150–400)
RBC: 4.55 MIL/uL (ref 3.87–5.11)
RDW: 13.1 % (ref 11.5–15.5)
WBC: 6.9 10*3/uL (ref 4.0–10.5)
nRBC: 0 % (ref 0.0–0.2)

## 2022-12-07 LAB — POCT URINE DRUG SCREEN - MANUAL ENTRY (I-SCREEN)
POC Amphetamine UR: POSITIVE — AB
POC Buprenorphine (BUP): POSITIVE — AB
POC Cocaine UR: POSITIVE — AB
POC Marijuana UR: POSITIVE — AB
POC Methadone UR: NOT DETECTED
POC Methamphetamine UR: POSITIVE — AB
POC Morphine: NOT DETECTED
POC Oxazepam (BZO): POSITIVE — AB
POC Oxycodone UR: NOT DETECTED
POC Secobarbital (BAR): NOT DETECTED

## 2022-12-07 LAB — COMPREHENSIVE METABOLIC PANEL
ALT: 15 U/L (ref 0–44)
AST: 17 U/L (ref 15–41)
Albumin: 3.7 g/dL (ref 3.5–5.0)
Alkaline Phosphatase: 64 U/L (ref 38–126)
Anion gap: 8 (ref 5–15)
BUN: 11 mg/dL (ref 6–20)
CO2: 25 mmol/L (ref 22–32)
Calcium: 8.9 mg/dL (ref 8.9–10.3)
Chloride: 103 mmol/L (ref 98–111)
Creatinine, Ser: 0.78 mg/dL (ref 0.44–1.00)
GFR, Estimated: >60 mL/min (ref >60)
Glucose, Bld: 91 mg/dL (ref 70–99)
Potassium: 4 mmol/L (ref 3.5–5.1)
Sodium: 136 mmol/L (ref 135–145)
Total Bilirubin: 0.2 mg/dL — ABNORMAL LOW (ref 0.3–1.2)
Total Protein: 6.5 g/dL (ref 6.5–8.1)

## 2022-12-07 LAB — HEMOGLOBIN A1C
Hgb A1c MFr Bld: 5.3 % (ref 4.8–5.6)
Mean Plasma Glucose: 105.41 mg/dL

## 2022-12-07 LAB — LIPID PANEL
Cholesterol: 191 mg/dL (ref 0–200)
HDL: 51 mg/dL (ref >40)
LDL Cholesterol: 114 mg/dL — ABNORMAL HIGH (ref 0–99)
Total CHOL/HDL Ratio: 3.7 RATIO
Triglycerides: 131 mg/dL (ref <150)
VLDL: 26 mg/dL (ref 0–40)

## 2022-12-07 LAB — URINALYSIS, ROUTINE W REFLEX MICROSCOPIC
Bilirubin Urine: NEGATIVE
Glucose, UA: NEGATIVE mg/dL
Hgb urine dipstick: NEGATIVE
Ketones, ur: NEGATIVE mg/dL
Nitrite: NEGATIVE
Protein, ur: NEGATIVE mg/dL
Specific Gravity, Urine: 1.017 (ref 1.005–1.030)
pH: 5 (ref 5.0–8.0)

## 2022-12-07 LAB — POC URINE PREG, ED: Preg Test, Ur: NEGATIVE

## 2022-12-07 LAB — ETHANOL: Alcohol, Ethyl (B): <10 mg/dL (ref <10)

## 2022-12-07 LAB — TSH: TSH: 2.287 u[IU]/mL (ref 0.350–4.500)

## 2022-12-07 LAB — MAGNESIUM: Magnesium: 1.9 mg/dL (ref 1.7–2.4)

## 2022-12-07 MED ORDER — OLANZAPINE 5 MG PO TBDP: 5.0000 mg | ORAL_TABLET | Freq: Every day | ORAL | Status: DC

## 2022-12-07 MED ORDER — HYDROXYZINE HCL 25 MG PO TABS
25.0000 mg | ORAL_TABLET | Freq: Three times a day (TID) | ORAL | Status: DC | PRN
  Filled 2022-12-07: qty 1

## 2022-12-07 MED ORDER — BUPRENORPHINE HCL-NALOXONE HCL 8-2 MG SL SUBL: 1.0000 | SUBLINGUAL_TABLET | Freq: Three times a day (TID) | SUBLINGUAL | Status: DC

## 2022-12-07 MED ORDER — MAGNESIUM HYDROXIDE 400 MG/5ML PO SUSP: 30.0000 mL | Freq: Every day | ORAL | Status: DC | PRN

## 2022-12-07 MED ORDER — ACETAMINOPHEN 325 MG PO TABS: 650.0000 mg | ORAL_TABLET | Freq: Four times a day (QID) | ORAL | Status: DC | PRN

## 2022-12-07 MED ORDER — BUPRENORPHINE HCL-NALOXONE HCL 8-2 MG SL SUBL
1.0000 | SUBLINGUAL_TABLET | Freq: Three times a day (TID) | SUBLINGUAL | Status: DC
  Administered 2022-12-07: 1 via SUBLINGUAL
  Filled 2022-12-07: qty 1

## 2022-12-07 MED ORDER — TRAZODONE HCL 50 MG PO TABS: 50.0000 mg | ORAL_TABLET | Freq: Every evening | ORAL | Status: DC | PRN

## 2022-12-07 MED ORDER — ALUM & MAG HYDROXIDE-SIMETH 200-200-20 MG/5ML PO SUSP: 30.0000 mL | ORAL | Status: DC | PRN

## 2022-12-07 MED ORDER — OLANZAPINE 5 MG PO TBDP
5.0000 mg | ORAL_TABLET | Freq: Every day | ORAL | Status: DC
  Administered 2022-12-07: 5 mg via ORAL
  Filled 2022-12-07: qty 1

## 2022-12-07 NOTE — Discharge Instructions (Addendum)
Transfer to [facility] for inpatient psychiatric treatment.

## 2022-12-07 NOTE — Progress Notes (Signed)
   12/07/22 1217  [facility] Triage Screening (Walk-ins at [facility] only)  How Did You Hear About [place]? Self  What Is the Reason for Your Visit/Call Today? Pt is a 47 yo female who presents to [facility] voluntarily due to the request of her mother. Pt reports that her mother thinks she needs to see someone because her mother feels she is hearing voices. Pt presents paranoid and feels that he ex boyfriend of 9 years ago is out to get her. Pt reports that she has been hearing her mother and an ex boyfriend taking in the room but states her mother denies speaking to him. Pt reports that she knows that cameras are in her room and her ex is montioring her. Pt denies SI, HI, and VH. Pt denies outpatient services. Pt denies drug or alcohol use. Pt reports that she has been sober since her release from jail in October 2023. Pt denies any mental health hx.  How Long Has This Been Causing You Problems? 1-6 months  Have You Recently Had Any Thoughts About Hurting Yourself? No  Are You Planning to Commit Suicide/Harm Yourself At This time? No  Have you Recently Had Thoughts About Hurting Someone [name]? No  Are You Planning To Harm Someone At This Time? No  Are you currently experiencing any auditory, visual or other hallucinations? Yes  Please explain the hallucinations you are currently experiencing: Hearing voices  Have You Used Any Alcohol or Drugs in the Past 24 Hours? No  Do you have any current medical co-morbidities that require immediate attention? No  Clinician description of patient physical appearance/behavior: Pt was cooperative but presents paranoid.  What Do You Feel Would Help You the Most Today? Treatment for Depression or other mood problem;Stress Management;Medication(s)  If access to [facility] was not available, would you have sought care in the Emergency Department? Yes  Determination of Need Routine (7 days)  Options For Referral Outpatient Therapy;Medication Management    Flowsheet Row ED from  12/07/2022 in [facility]  C-SSRS RISK CATEGORY No Risk

## 2022-12-07 NOTE — ED Provider Notes (Signed)
[facility]/OBS ASAP Discharge Summary  Date and Time: 12/07/2022 6:20 PM  Name: Hailey Perez  MRN:  [id number]   Discharge Diagnoses:  Final diagnoses:  Acute psychosis [facility])   Subjective: "I hear people talking about me"  Stay Summary: Per H&P 12/07/22 1435: "Hailey Perez is a 47 y.o. female patient with a past psychiatric history of depression, polysubstance abuse, heroin abuse, and cocaine abuse who presented to [facility] voluntarily and unaccompanied with complaints of auditory hallucinations. Patient states her mother requested for her to come here today because her mother feels she is hearing voices.   Patient assessed face-to-face by this provider, consulted with Dr. Lucianne Muss, and chart reviewed on 12/07/22. On evaluation, Hailey Perez is seated in assessment area in no acute distress. Patient is alert and oriented x4, pleasant. Speech is clear and coherent, normal rate and volume. Eye contact is good. Mood is anxious with tearful and congruent affect. Thought process is linear with thought content that consists of paranoid ideation and delusions. Patient believes that her family is talking badly about her and using the internet against her, people are watching her through a camera system, her ex-boyfriend has set up cameras in her room to monitor her and is out to get her, there is videos of her on the internet, there are people in her house putting apps on her phone and when she wakes up her phone is different, her step-father has given her mother money to do this to her, and her ex-boyfriend knows her social security number and birthday and is changing her email passwords. 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 Patient denies a history of suicide  attempts or self-harm. Patient denies past psychiatric hospitalizations but reports she did go to a hospital in [facility] for detox before. Per chart review, patient presented to [facility] and [facility] multiple times for polysubstance abuse between 2015-2018. Patient reports auditory hallucinations of hearing her mother, ex-boyfriend, and other people's voices talking about her. Patient denies visual hallucinations. Patient's description of associations is circumstantial and she appears to be fixated on her paranoid ideations and delusions. Objectively, there is evidence of psychosis and delusional thinking. Patient does not appear to be responding to internal or external stimuli at this time.    Patient reports good sleep (9 hours/night) and good appetite. Patient states she currently resides with her mother, step-father, and several other family members. Patient denies access to firearms. Patient states she is about to start a job at [employer]. Patient reports a history of polysubstance abuse stating "I did everything in the world for 10 years." Patient states she went to jail in June or July 2023 and was released October 2023. Patient initially states she has been sober since being incarcerated then reports she recently relapsed on methamphetamine a few weeks ago. Patient reports she has also taken Xanax not currently prescribed to her. Patient denies use of alcohol or other illicit substances. Patient states she is currently being prescribed Suboxone 8mg  TID by [facility] and last had a dose this morning. Patient denies having outpatient psychiatric services currently in place for therapy or medication management. Patient denies currently taking any psychotropic medications. Patient states she does not want to be hospitalized because she may  lose her job and is interested in starting therapy.    Patient offered support and encouragement. Discussed with patient that she will be placed under IVC. Discussed with patient  recommendation for inpatient psychiatric treatment. Discussed with patient starting Zyprexa 5mg  daily and provided education on benefits/risks of the medication. Discussed with patient continuing home medication (Suboxone)."  Total Time spent with patient: 20 minutes  Past Psychiatric History: Depression, polysubstance abuse, heroin abuse, and cocaine abuse   Past Medical History:  Past Medical History:  Diagnosis Date   Substance abuse (HCC)    Family History:  Family History  Problem Relation Age of Onset   Hypertension Mother    Family Psychiatric History: None reported  Social History:  Social History   Tobacco Use   Smoking status: Every Day    Packs/day: .5    Types: Cigarettes   Smokeless tobacco: Never  Vaping Use   Vaping Use: Never used  Substance Use Topics   Alcohol use: No   Drug use: Yes    Types: IV    Comment: heroin   Tobacco Cessation:  A prescription for an FDA-approved tobacco cessation medication was offered at discharge and the patient refused  Current Medications:  Current Facility-Administered Medications  Medication Dose Route Frequency Provider Last Rate Last Admin   acetaminophen (TYLENOL) tablet 650 mg  650 mg Oral Q6H PRN Sunday Corn, NP       alum & mag hydroxide-simeth (MAALOX/MYLANTA) 200-200-20 MG/5ML suspension 30 mL  30 mL Oral Q4H PRN Sunday Corn, NP       buprenorphine-naloxone (SUBOXONE) 8-2 mg per SL tablet 1 tablet  1 tablet Sublingual TID Sunday Corn, NP   1 tablet at 12/07/22 1539   hydrOXYzine (ATARAX) tablet 25 mg  25 mg Oral TID PRN Sunday Corn, NP       magnesium hydroxide (MILK OF MAGNESIA) suspension 30 mL  30 mL Oral Daily PRN Sunday Corn, NP       OLANZapine zydis (ZYPREXA) disintegrating tablet 5 mg  5 mg Oral Daily Sunday Corn, NP   5 mg at 12/07/22 1435   traZODone (DESYREL) tablet 50 mg  50 mg Oral QHS PRN Sunday Corn, NP       Current Outpatient Medications  Medication Sig  Dispense Refill   chlorhexidine (PERIDEX) 0.12 % solution Use as directed 15 mLs in the mouth or throat 2 (two) times daily.     ibuprofen (ADVIL) 800 MG tablet Take 800 mg by mouth every 6 (six) hours as needed (For pain).     naloxone (NARCAN) nasal spray 4 mg/0.1 mL Place 1 spray into the nose once as needed (For opioid overdose).     Prenatal Vit-Fe Fumarate-FA (M-NATAL PLUS) 27-1 MG TABS Take 1 tablet by mouth daily.     SUBOXONE 8-2 MG FILM Place 1 Film under the tongue 3 (three) times daily.      PTA Medications:  Facility Ordered Medications  Medication   acetaminophen (TYLENOL) tablet 650 mg   alum & mag hydroxide-simeth (MAALOX/MYLANTA) 200-200-20 MG/5ML suspension 30 mL   magnesium hydroxide (MILK OF MAGNESIA) suspension 30 mL   hydrOXYzine (ATARAX) tablet 25 mg   traZODone (DESYREL) tablet 50 mg   buprenorphine-naloxone (SUBOXONE) 8-2 mg per SL tablet 1 tablet   OLANZapine zydis (ZYPREXA) disintegrating tablet 5 mg   PTA Medications  Medication Sig   naloxone (NARCAN) nasal spray 4 mg/0.1 mL Place 1 spray into the nose once  as needed (For opioid overdose).   Prenatal Vit-Fe Fumarate-FA (M-NATAL PLUS) 27-1 MG TABS Take 1 tablet by mouth daily.   chlorhexidine (PERIDEX) 0.12 % solution Use as directed 15 mLs in the mouth or throat 2 (two) times daily.   ibuprofen (ADVIL) 800 MG tablet Take 800 mg by mouth every 6 (six) hours as needed (For pain).   SUBOXONE 8-2 MG FILM Place 1 Film under the tongue 3 (three) times daily.       07/16/2022    3:52 PM  Depression screen PHQ 2/9  Decreased Interest 2  Down, Depressed, Hopeless 3  PHQ - 2 Score 5  Altered sleeping 3  Tired, decreased energy 2  Change in appetite 2  Feeling bad or failure about yourself  3  Trouble concentrating 2  Moving slowly or fidgety/restless 0  Suicidal thoughts 0  PHQ-9 Score 17  Difficult doing work/chores Somewhat difficult    Flowsheet Row ED from 12/07/2022 in [facility]  C-SSRS RISK CATEGORY No Risk       Musculoskeletal  Strength & Muscle Tone: within normal limits Gait & Station: normal Patient leans: N/A  Psychiatric Specialty Exam  Presentation  General Appearance:  Appropriate for Environment; Well Groomed  Eye Contact: Good  Speech: Clear and Coherent; Normal Rate  Speech Volume: Normal  Handedness: Right   Mood and Affect  Mood: Anxious  Affect: Congruent; Tearful   Thought Process  Thought Processes: Linear  Descriptions of Associations:Circumstantial  Orientation:Full (Time, Place and Person)  Thought Content:Paranoid Ideation; Delusions  Diagnosis of Schizophrenia or Schizoaffective disorder in past: No  Duration of Psychotic Symptoms: Greater than six months   Hallucinations:Hallucinations: Auditory Description of Auditory Hallucinations: Patient states she hears her mother, ex-boyfriend, and other people's voices talking about her  Ideas of Reference:Paranoia; Percusatory; Delusions  Suicidal Thoughts:Suicidal Thoughts: No  Homicidal Thoughts:Homicidal Thoughts: No   Sensorium  Memory: Immediate Fair; Recent Fair; Remote Fair  Judgment: Poor  Insight: Lacking   Executive Functions  Concentration: Fair  Attention Span: Fair  Recall: [employer] of Knowledge: Fair  Language: Fair   Psychomotor Activity  Psychomotor Activity: Psychomotor Activity: Normal   Assets  Assets: Manufacturing systems engineer; Desire for Improvement; Financial Resources/Insurance; Housing; Physical Health; Resilience; Social Support; Transportation   Sleep  Sleep: Sleep: Good Number of Hours of Sleep: 9   Nutritional Assessment (For OBS and FBC admissions only) Has the patient had a weight loss or gain of 10 pounds or more in the last 3 months?: No Has the patient had a decrease in food intake/or appetite?: No Does the patient have dental problems?: No Does the patient have eating habits  or behaviors that may be indicators of an eating disorder including binging or inducing vomiting?: No Has the patient recently lost weight without trying?: 0 Has the patient been eating poorly because of a decreased appetite?: 0 Malnutrition Screening Tool Score: 0    Physical Exam  Physical Exam Vitals and nursing note reviewed.  Constitutional:      General: She is not in acute distress.    Appearance: Normal appearance. She is not ill-appearing.  HENT:     Head: Normocephalic and atraumatic.     Nose: Nose normal.  Eyes:     General:        Right eye: No discharge.        Left eye: No discharge.     Conjunctiva/sclera: Conjunctivae normal.  Cardiovascular:     Rate  and Rhythm: Normal rate.  Pulmonary:     Effort: Pulmonary effort is normal. No respiratory distress.  Musculoskeletal:        General: Normal range of motion.     Cervical back: Normal range of motion.  Skin:    General: Skin is warm and dry.  Neurological:     General: No focal deficit present.     Mental Status: She is alert and oriented to person, place, and time.  Psychiatric:        Attention and Perception: Attention normal. She perceives auditory hallucinations.        Mood and Affect: Mood is anxious. Affect is tearful.        Speech: Speech normal.        Behavior: Behavior normal.        Thought Content: Thought content is paranoid and delusional. Thought content does not include homicidal or suicidal ideation. Thought content does not include homicidal or suicidal plan.        Cognition and Memory: Cognition and memory normal.     Comments: Judgment: Poor    Review of Systems  Constitutional: Negative.   HENT: Negative.    Eyes: Negative.   Respiratory: Negative.    Cardiovascular: Negative.   Gastrointestinal: Negative.   Genitourinary: Negative.   Musculoskeletal: Negative.   Skin: Negative.   Neurological: Negative.   Endo/Heme/Allergies: Negative.   Psychiatric/Behavioral:  Positive  for hallucinations and substance abuse. Negative for depression, memory loss and suicidal ideas. The patient is nervous/anxious. The patient does not have insomnia.    Blood pressure 98/69, pulse 66, temperature 98.2 F (36.8 C), temperature source Oral, resp. rate 18, SpO2 100 %. There is no height or weight on file to calculate BMI.  Demographic Factors:  Caucasian, Low socioeconomic status, and Unemployed  Loss Factors: N/A  Historical Factors: N/A  Risk Reduction Factors:   Sense of responsibility to family, Living with another person, especially a relative, and Positive social support  Continued Clinical Symptoms:  Depression:   Comorbid alcohol abuse/dependence Alcohol/Substance Abuse/Dependencies More than one psychiatric diagnosis Currently Psychotic  Cognitive Features That Contribute To Risk:  None    Suicide Risk:  Minimal: No identifiable suicidal ideation.  Patients presenting with no risk factors but with morbid ruminations; may be classified as minimal risk based on the severity of the depressive symptoms  Plan Of Care/Follow-up Recommendations:  Other:  Recommend inpatient psychiatric treatment, patient currently under IVC. Patient has been started on Zyprexa 5mg  daily. Home medication: buprenorphine-naloxone 8-2mg  per SL tablet TID has been continued.  Patient also has orders for Tylenol 650mg  q6h PRN mild pain, Maalox 30ml q4h PRN indigestion, hydroxyzine 25mg  TID PRN anxiety, milk of magnesia 30ml daily PRN mild constipation, and trazodone 50mg  at bedtime PRN sleep currently in place. Patient will be [facility].  Disposition: Per Cathie Beams, LCSW, patient has been accepted to [facility] today (12/07/22), bed assignment: Deatra Canter B. Attending physician will be Theophilus Bones, MD.  Sunday Corn, NP 12/07/2022, 6:20 PM

## 2022-12-07 NOTE — ED Notes (Signed)
Patient  refused medication then decided to take it.

## 2022-12-07 NOTE — Progress Notes (Signed)
Pt was accepted to [facility] TODAY 12/07/2022. Bed assignment: Erlene Quan  Pt meets inpatient criteria per Erskine Emery, RN  Attending Physician will be Theophilus Bones, MD  Report can be called to: [phone]  Pt can arrive anytime today; bed is ready now  Care Team Notified: Erskine Emery, NP and Durwin Reges, RN  Cathie Beams, LCSW  12/07/2022 3:52 PM

## 2022-12-07 NOTE — ED Provider Notes (Signed)
[facility] Continuous Assessment Admission H&P  Date: 12/07/22 Patient Name: Hailey Perez MRN: [id number] Chief Complaint: "I hear people talking about me"  Diagnoses:  Final diagnoses:  Acute psychosis (HCC)   HPI: Hailey Perez is a 47 y.o. female patient with a past psychiatric history of depression, polysubstance abuse, heroin abuse, and cocaine abuse who presented to [facility] voluntarily and unaccompanied with complaints of auditory hallucinations. Patient states her mother requested for her to come here today because her mother feels she is hearing voices.  Patient assessed face-to-face by this provider, consulted with Dr. Lucianne Muss, and chart reviewed on 12/07/22. On evaluation, Hailey Perez is seated in assessment area in no acute distress. Patient is alert and oriented x4, pleasant. Speech is clear and coherent, normal rate and volume. Eye contact is good. Mood is anxious with tearful and congruent affect. Thought process is linear with thought content that consists of paranoid ideation and delusions. Patient believes that her family is talking badly about her and using the internet against her, people are watching her through a camera system, her ex-boyfriend has set up cameras in her room to monitor her and is out to get her, there is videos of her on the internet, there are people in her house putting apps on her phone and when she wakes up her phone is different, her step-father has given her mother money to do this to her, and her ex-boyfriend knows her social security number and birthday and is changing her email passwords. 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 Patient denies a history of suicide attempts or self-harm. Patient  denies past psychiatric hospitalizations but reports she did go to a hospital in [facility] for detox before. Per chart review, patient presented to [facility] multiple times for polysubstance abuse between 2015-2018. Patient reports auditory hallucinations of hearing her mother, ex-boyfriend, and other people's voices talking about her. Patient denies visual hallucinations. Patient's description of associations is circumstantial and she appears to be fixated on her paranoid ideations and delusions. Objectively, there is evidence of psychosis and delusional thinking. Patient does not appear to be responding to internal or external stimuli at this time.   Patient reports good sleep (9 hours/night) and good appetite. Patient states she currently resides with her mother, step-father, and several other family members. Patient denies access to firearms. Patient states she is about to start a job at [employer]. Patient reports a history of polysubstance abuse stating "I did everything in the world for 10 years." Patient states she went to jail in June or July 2023 and was released October 2023. Patient initially states she has been sober since being incarcerated then reports she recently relapsed on methamphetamine a few weeks ago. Patient reports she has also taken Xanax not currently prescribed to her. Patient denies use of alcohol or other illicit substances. Patient states she is currently being prescribed Suboxone 8mg  TID by [facility] and last had a dose this morning. Patient denies having outpatient psychiatric services currently in place for therapy or medication management. Patient denies currently taking any psychotropic medications. Patient states she does not want to be hospitalized because she may lose her job and is interested in starting therapy.  Patient offered support and encouragement. Discussed with patient that she will be placed under IVC. Discussed with patient recommendation for inpatient  psychiatric treatment. Discussed with patient starting Zyprexa 5mg  daily and provided education on benefits/risks of the medication. Discussed with patient continuing home medication (Suboxone).   Total Time spent with patient: 1 hour  Musculoskeletal  Strength & Muscle Tone: within normal limits Gait & Station: normal Patient leans: N/A  Psychiatric Specialty Exam  Presentation General Appearance:  Appropriate for Environment; Well Groomed  Eye Contact: Good  Speech: Clear and Coherent; Normal Rate  Speech Volume: Normal  Handedness: Right   Mood and Affect  Mood: Anxious  Affect: Congruent; Tearful   Thought Process  Thought Processes: Linear  Descriptions of Associations:Circumstantial  Orientation:Full (Time, Place and Person)  Thought Content:Paranoid Ideation; Delusions  Diagnosis of Schizophrenia or Schizoaffective disorder in past: No  Duration of Psychotic Symptoms: Greater than six months  Hallucinations:Hallucinations: Auditory Description of Auditory Hallucinations: Patient states she hears her mother, ex-boyfriend, and other people's voices talking about her  Ideas of Reference:Paranoia; Percusatory; Delusions  Suicidal Thoughts:Suicidal Thoughts: No  Homicidal Thoughts:Homicidal Thoughts: No   Sensorium  Memory: Immediate Fair; Recent Fair; Remote Fair  Judgment: Poor  Insight: Lacking   Executive Functions  Concentration: Fair  Attention Span: Fair  Recall: [employer] of Knowledge: Fair  Language: Fair   Psychomotor Activity  Psychomotor Activity: Psychomotor Activity: Normal   Assets  Assets: Manufacturing systems engineer; Desire for Improvement; Financial Resources/Insurance; Housing; Physical Health; Resilience; Social Support; Transportation   Sleep  Sleep: Sleep: Good Number of Hours of Sleep: 9   Nutritional Assessment (For OBS and FBC admissions only) Has the patient had a weight loss or gain of 10  pounds or more in the last 3 months?: No Has the patient had a decrease in food intake/or appetite?: No Does the patient have dental problems?: No Does the patient have eating habits or behaviors that may be indicators of an eating disorder including binging or inducing vomiting?: No Has the patient recently lost weight without trying?: 0 Has the patient been eating poorly because of a decreased appetite?: 0 Malnutrition Screening Tool Score: 0    Physical Exam Vitals and nursing note reviewed.  Constitutional:      General: She is not in acute distress.    Appearance: Normal appearance. She is not ill-appearing.  HENT:     Head: Normocephalic and atraumatic.     Nose: Nose normal.  Eyes:     General:        Right eye: No discharge.        Left eye: No discharge.     Conjunctiva/sclera: Conjunctivae normal.  Cardiovascular:     Rate and Rhythm: Normal rate.  Pulmonary:     Effort: Pulmonary effort is normal. No respiratory distress.  Musculoskeletal:        General: Normal range of motion.     Cervical back: Normal range of motion.  Skin:    General: Skin is warm and dry.  Neurological:     General: No focal deficit present.     Mental Status: She is alert and oriented to person, place, and time.  Psychiatric:        Attention and Perception: Attention normal. She perceives auditory hallucinations.        Mood and Affect: Mood is anxious. Affect is tearful.        Speech: Speech normal.        Behavior: Behavior  normal.        Thought Content: Thought content is paranoid and delusional. Thought content does not include homicidal or suicidal ideation. Thought content does not include homicidal or suicidal plan.        Cognition and Memory: Cognition and memory normal.     Comments: Judgment: Poor    Review of Systems  Constitutional: Negative.   HENT: Negative.    Eyes: Negative.   Respiratory: Negative.    Cardiovascular: Negative.   Gastrointestinal: Negative.    Genitourinary: Negative.   Musculoskeletal: Negative.   Skin: Negative.   Neurological: Negative.   Endo/Heme/Allergies: Negative.   Psychiatric/Behavioral:  Positive for hallucinations and substance abuse. Negative for depression, memory loss and suicidal ideas. The patient is nervous/anxious. The patient does not have insomnia.     Blood pressure (!) 149/99, pulse 92, temperature 98.2 F (36.8 C), temperature source Oral, resp. rate 19, SpO2 100 %. There is no height or weight on file to calculate BMI.  Past Psychiatric History: Depression, polysubstance abuse, heroin abuse, and cocaine abuse  Is the patient at risk to self? Yes  Has the patient been a risk to self in the past 6 months? No .    Has the patient been a risk to self within the distant past? No   Is the patient a risk to others? Yes   Has the patient been a risk to others in the past 6 months? No   Has the patient been a risk to others within the distant past? No   Past Medical History:  Past Medical History:  Diagnosis Date   Substance abuse (HCC)    Family History:  Family History  Problem Relation Age of Onset   Hypertension Mother    Social History:  Social History   Tobacco Use   Smoking status: Every Day    Packs/day: .5    Types: Cigarettes   Smokeless tobacco: Never  Vaping Use   Vaping Use: Never used  Substance Use Topics   Alcohol use: No   Drug use: Yes    Types: IV    Comment: heroin   Last Labs:  Admission on 12/07/2022  Component Date Value Ref Range Status   Preg Test, Ur 12/07/2022 Negative  Negative Final   POC Amphetamine UR 12/07/2022 Positive (A)  NONE DETECTED (Cut Off Level 1000 ng/mL) Final   POC Secobarbital (BAR) 12/07/2022 None Detected  NONE DETECTED (Cut Off Level 300 ng/mL) Final   POC Buprenorphine (BUP) 12/07/2022 Positive (A)  NONE DETECTED (Cut Off Level 10 ng/mL) Final   POC Oxazepam (BZO) 12/07/2022 Positive (A)  NONE DETECTED (Cut Off Level 300 ng/mL) Final    POC Cocaine UR 12/07/2022 Positive (A)  NONE DETECTED (Cut Off Level 300 ng/mL) Final   POC Methamphetamine UR 12/07/2022 Positive (A)  NONE DETECTED (Cut Off Level 1000 ng/mL) Final   POC Morphine 12/07/2022 None Detected  NONE DETECTED (Cut Off Level 300 ng/mL) Final   POC Methadone UR 12/07/2022 None Detected  NONE DETECTED (Cut Off Level 300 ng/mL) Final   POC Oxycodone UR 12/07/2022 None Detected  NONE DETECTED (Cut Off Level 100 ng/mL) Final   POC Marijuana UR 12/07/2022 Positive (A)  NONE DETECTED (Cut Off Level 50 ng/mL) Final    Allergies: Patient has no known allergies.  Medications:  Facility Ordered Medications  Medication   acetaminophen (TYLENOL) tablet 650 mg   alum & mag hydroxide-simeth (MAALOX/MYLANTA) 200-200-20 MG/5ML suspension 30 mL   magnesium hydroxide (  MILK OF MAGNESIA) suspension 30 mL   hydrOXYzine (ATARAX) tablet 25 mg   traZODone (DESYREL) tablet 50 mg   buprenorphine-naloxone (SUBOXONE) 8-2 mg per SL tablet 1 tablet   OLANZapine zydis (ZYPREXA) disintegrating tablet 5 mg   PTA Medications  Medication Sig   naloxone (NARCAN) nasal spray 4 mg/0.1 mL Place 1 spray into the nose once as needed (For opioid overdose).   Prenatal Vit-Fe Fumarate-FA (M-NATAL PLUS) 27-1 MG TABS Take 1 tablet by mouth daily.   chlorhexidine (PERIDEX) 0.12 % solution Use as directed 15 mLs in the mouth or throat 2 (two) times daily.   ibuprofen (ADVIL) 800 MG tablet Take 800 mg by mouth every 6 (six) hours as needed (For pain).   SUBOXONE 8-2 MG FILM Place 1 Film under the tongue 3 (three) times daily.      Medical Decision Making  Hailey Perez was admitted to [facility] continuous assessment unit for Acute psychosis [facility]), crisis management, and stabilization. Routine labs ordered, which include Lab Orders         CBC with Differential/Platelet         Comprehensive metabolic panel         Hemoglobin A1c         Magnesium         Ethanol          Lipid panel         TSH         Prolactin         Urinalysis, Routine w reflex microscopic -Urine, Clean Catch         POC urine preg, ED         POCT Urine Drug Screen - (I-Screen)    -EKG Medication Management: Medications started, continue home medications as appropriate Meds ordered this encounter  Medications   acetaminophen (TYLENOL) tablet 650 mg   alum & mag hydroxide-simeth (MAALOX/MYLANTA) 200-200-20 MG/5ML suspension 30 mL   magnesium hydroxide (MILK OF MAGNESIA) suspension 30 mL   hydrOXYzine (ATARAX) tablet 25 mg   traZODone (DESYREL) tablet 50 mg   buprenorphine-naloxone (SUBOXONE) 8-2 mg per SL tablet 1 tablet    Home med   OLANZapine zydis (ZYPREXA) disintegrating tablet 5 mg    Will maintain observation checks every 15 minutes for safety. Psychosocial education regarding relapse prevention and self-care; social and communication  Social work will consult with family for collateral information and discuss discharge and follow up plan.   Recommendations  Based on my evaluation the patient does not appear to have an emergency medical condition.  -Patient will be placed under IVC, affidavit and petitioner for involuntary commitment and first examination for involuntary commitment completed -Start Zyprexa 5mg  daily -Continue home medication: buprenorphine-naloxone 8-2mg  per SL TID -Recommend inpatient psychiatric treatment; secure message sent to [facility], no appropriate beds currently available at [facility] Cathie Beams, LCSW will fax patient out.  Sunday Corn, NP 12/07/22  3:21 PM

## 2022-12-07 NOTE — ED Notes (Signed)
Sheriff called to pick patient up and ivc papers faxed.

## 2022-12-07 NOTE — ED Notes (Signed)
Rn went to work patient up she refused states that she is going home. Rn notified provider

## 2022-12-07 NOTE — ED Notes (Addendum)
Rn talked to clara rn in intake at 1601. States patient can be sent once ivc paper come back. [phone]. Albin Felling states patient can arrive at anytime .States that they are a 24 hour faciltiy.

## 2022-12-07 NOTE — ED Notes (Signed)
Patient in milieu. Environment is secured. Will continue to monitor for safety. 

## 2022-12-07 NOTE — Progress Notes (Signed)
LCSW Progress Note  [id number]   Hailey Perez  12/07/2022  3:24 PM  Description:   Inpatient Psychiatric Referral  Patient was recommended inpatient per Erskine Emery, NP. There are no available beds at [facility], per [facility] Rona Ravens, RN. Patient was referred to the following out of network facilities:   Destination  Service Provider Address Phone Fax  [facility]  [address] [place] [address] [phone]  [facility] [address]  [address] [place] [address] [phone]  [facility]  [address] [place], [place] [address] [phone]  [facility] [address] [place]  [address] [place] [address] [phone]  [facility]  [address] [place], [place] [address] [phone]  [facility]  [address] [place] [address] [phone]  [facility]  [address] [place] [address] [phone]  [facility]  [address] [place] [address] [phone]  [facility]  [address], [place] [address] [phone]  [facility]  [address] [place] [address] [phone]  [facility]  [address] [place] [address] [phone]  [facility]  [address], [place] [address] [phone]  [facility]  [address] [place] [address] [phone]  [facility]  [address], [place] [address] [phone]  [facility]  [address] [place] [address] [phone]  [facility]  [address], [place] [address] [phone]  [facility]  [address] [place] [address] [phone]  [facility]  [address] [place] [address] [phone]  [facility]  [address], [place] [address] [phone]  [facility]  [address] [place] [address] [phone]  [facility]  [address] [place] [address] [phone]  [facility]  [address], [place] [address] [phone]  [facility]  [address] [place] [address] [phone]  [facility]  [address], [place] [address] [phone]  [facility]  [address] [place] [address] [phone]  [facility]  [address] [place] [address] [phone]  [facility]  [address], [place] [address] [phone]    Situation ongoing, CSW to continue following and update chart as more information becomes available.      Cathie Beams, [place]  12/07/2022 3:24 PM

## 2022-12-07 NOTE — ED Notes (Signed)
Rn called to have ivc paper work served

## 2022-12-07 NOTE — ED Notes (Signed)
Rn called report at 81 to syteria parnell rn .shierff state he will be here at 700pm

## 2022-12-07 NOTE — ED Notes (Signed)
Pt admitted to obs . Denies SI/HI/AVH. Calm, cooperative throughout interview process. Skin assessment completed. Oriented to unit. Meal and drink offered. At currrent, pt continue to denies SI/HI/AVH. Pt verbally contract for safety. Will monitor for safety. 

## 2022-12-08 LAB — PROLACTIN: Prolactin: 11.5 ng/mL (ref 4.8–33.4)

## 2022-12-14 ENCOUNTER — Ambulatory Visit
Admission: RE | Admit: 2022-12-14 | Discharge: 2022-12-14 | Disposition: A | Discharge status: Home / Self Care | Payer: Medicaid Other | Source: Ambulatory Visit | Attending: Pulmonary Disease | Admitting: Pulmonary Disease

## 2022-12-14 DIAGNOSIS — Z1231 Encounter for screening mammogram for malignant neoplasm of breast: Secondary | ICD-10-CM

## 2023-01-17 ENCOUNTER — Ambulatory Visit: Payer: Self-pay | Admitting: Podiatry

## 2023-04-14 ENCOUNTER — Ambulatory Visit (INDEPENDENT_AMBULATORY_CARE_PROVIDER_SITE_OTHER): Payer: PRIVATE HEALTH INSURANCE

## 2023-04-14 ENCOUNTER — Encounter (HOSPITAL_COMMUNITY): Payer: Self-pay

## 2023-04-14 DIAGNOSIS — F333 Major depressive disorder, recurrent, severe with psychotic symptoms: Secondary | ICD-10-CM | POA: Diagnosis not present

## 2023-04-14 DIAGNOSIS — F1220 Cannabis dependence, uncomplicated: Secondary | ICD-10-CM

## 2023-04-14 DIAGNOSIS — F1421 Cocaine dependence, in remission: Secondary | ICD-10-CM

## 2023-04-14 DIAGNOSIS — F1121 Opioid dependence, in remission: Secondary | ICD-10-CM

## 2023-04-14 DIAGNOSIS — F4310 Post-traumatic stress disorder, unspecified: Secondary | ICD-10-CM

## 2023-04-14 DIAGNOSIS — F411 Generalized anxiety disorder: Secondary | ICD-10-CM
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
## 2023-04-15 ENCOUNTER — Encounter (HOSPITAL_COMMUNITY): Payer: Self-pay

## 2023-04-18 ENCOUNTER — Telehealth (HOSPITAL_COMMUNITY): Payer: Self-pay

## 2023-04-18 NOTE — Telephone Encounter (Signed)
Front desk received a call from [place] wanting to schedule therapy.  This therapist calls her back and inquires about any substances she may have used since last Thursday, 04-14-23 when this therapist did her CCA due to [place] leaving a message a few minutes ago and having slurred speech. Hailey Perez reports she used 1 mg of Xanax today. Therapist asked about previous days use and Hailey Perez says she did not use any Xanax since last Thursday, 04-14-23.  Therapist inquires as to who is prescribing Xanax to her and [name] says that no one is. Hailey Perez says this is the only medication that has ever made her feel better. Therapist cautions Hailey Perez regarding if she were to relapse on Opioids while using Xanax, this could end up in a fatality.  She says she already knows this and is not going to use any opioids. She notes she has not used Heroin is 13 years, though she did reports using pain pills for the past 7 years. Hailey Perez was unable to identify the pain pills on 04-14-23 during the CCA. Hailey Perez says she will never use the pills again.  This therapist briefly described the struggle with addiction and noted that willpower would not keep someone sober. Therapist discusses detox and Hailey Perez insists she has not used enough Xanax to need detox. Therapist warns her if she wants to come off this medication, she should do it in detox so she can be monitored for withdrawal symptoms.  Therapist asks if Hailey Perez has used other substances since 04-14-23 and she says she is still smoking cannabis but can quit if she has too. Therapist explains this is not a requirement of the therapist, rather it is a matter of what she wants for herself.  Hailey Perez says she knows it is not good for her health. And so he plans to stop, though she does not have a plan to do so.  Hailey Perez says she needs to talk to a therapist about things that are bothering her currently and in her past.   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  Hailey Perez says that she does not have any insurances and she has had trouble with people stealing her identify and setting her up with different insurances. She then says that someone is signing her up with [employer].  She asks this therapist which one is best. Therapist explains she needs to talk with someone who can explain what benefits are offered under various plans, as this therapist cannot recommend one insurance over another.   Hailey Perez asks that this therapist contact Hailey Perez to get her set up with therapy. Therapist explains she will send an email to [employer], [place], [facility], LCAS  Hailey Eisenmenger, MS, LMFT, LCAS  04-18-23

## 2023-04-26 ENCOUNTER — Ambulatory Visit (INDEPENDENT_AMBULATORY_CARE_PROVIDER_SITE_OTHER): Payer: PRIVATE HEALTH INSURANCE | Admitting: Licensed Clinical Social Worker

## 2023-04-26 DIAGNOSIS — F1220 Cannabis dependence, uncomplicated: Secondary | ICD-10-CM

## 2023-04-26 DIAGNOSIS — F4310 Post-traumatic stress disorder, unspecified: Secondary | ICD-10-CM

## 2023-04-26 DIAGNOSIS — F1420 Cocaine dependence, uncomplicated: Secondary | ICD-10-CM

## 2023-04-26 DIAGNOSIS — F19959 Other psychoactive substance use, unspecified with psychoactive substance-induced psychotic disorder, unspecified: Secondary | ICD-10-CM

## 2023-04-26 DIAGNOSIS — F1121 Opioid dependence, in remission: Secondary | ICD-10-CM
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
## 2023-04-27 ENCOUNTER — Telehealth (HOSPITAL_COMMUNITY): Payer: Self-pay | Admitting: Licensed Clinical Social Worker

## 2023-04-27 NOTE — Telephone Encounter (Signed)
Note in error.

## 2023-04-27 NOTE — Telephone Encounter (Signed)
The therapist returns Hailey Perez's call confirming her identity via two identifiers. She says that her family came into her room asking her to give them back her stepfather's phone; however, Hailey Perez declined to do so as she believes there are numbers on there that prove that her family is trying to set her up for some sort of "conspiracy" regarding a "Brinks Card." She says that things are missing from her room which she believes they took from her covertly. She says that she did not sign up for this card and is crying and panicky believing she is going to go to jail.  The therapist explains that as Hailey Perez has not used the card, then she should have no worries about legal consequences; however, the therapist advises her to give her stepfather's phone back as refusing to do so could lead to legal charges as the phone is not her property.  The therapist also reminds her of the availability of the [facility] 24/7. She seems to indicate that she will give the phone back and then ends the call.  It is worth noting that during this call, she continues to talk about being live streamed, etcetera remaining extremely paranoid.  Hailey Blazer, MA, LCSW, [facility], LCAS 04/27/2023

## 2023-05-03 ENCOUNTER — Ambulatory Visit (INDEPENDENT_AMBULATORY_CARE_PROVIDER_SITE_OTHER): Payer: PRIVATE HEALTH INSURANCE | Admitting: Licensed Clinical Social Worker

## 2023-05-03 ENCOUNTER — Encounter (HOSPITAL_COMMUNITY): Payer: Self-pay | Admitting: Nurse Practitioner

## 2023-05-03 ENCOUNTER — Other Ambulatory Visit: Payer: Self-pay

## 2023-05-03 ENCOUNTER — Inpatient Hospital Stay (HOSPITAL_COMMUNITY)
Admission: AD | Admit: 2023-05-03 | Discharge: 2023-05-10 | DRG: 885 | Disposition: A | Discharge status: Home / Self Care | Payer: 59 | Source: Intra-hospital | Attending: Psychiatry | Admitting: Psychiatry

## 2023-05-03 ENCOUNTER — Ambulatory Visit (HOSPITAL_COMMUNITY)
Admission: EM | Admit: 2023-05-03 | Discharge: 2023-05-03 | Disposition: A | Discharge status: Other Acute Inpatient Hospital | Payer: MEDICAID | Attending: Nurse Practitioner | Admitting: Nurse Practitioner

## 2023-05-03 DIAGNOSIS — Z5941 Food insecurity: Secondary | ICD-10-CM | POA: Diagnosis not present

## 2023-05-03 DIAGNOSIS — R45851 Suicidal ideations: Secondary | ICD-10-CM | POA: Diagnosis present

## 2023-05-03 DIAGNOSIS — F331 Major depressive disorder, recurrent, moderate: Secondary | ICD-10-CM | POA: Diagnosis not present

## 2023-05-03 DIAGNOSIS — F332 Major depressive disorder, recurrent severe without psychotic features: Secondary | ICD-10-CM | POA: Insufficient documentation

## 2023-05-03 DIAGNOSIS — F329 Major depressive disorder, single episode, unspecified: Secondary | ICD-10-CM | POA: Diagnosis present

## 2023-05-03 DIAGNOSIS — F17210 Nicotine dependence, cigarettes, uncomplicated: Secondary | ICD-10-CM | POA: Diagnosis present

## 2023-05-03 DIAGNOSIS — N390 Urinary tract infection, site not specified: Secondary | ICD-10-CM | POA: Diagnosis present

## 2023-05-03 DIAGNOSIS — F339 Major depressive disorder, recurrent, unspecified: Secondary | ICD-10-CM | POA: Diagnosis present

## 2023-05-03 DIAGNOSIS — F1121 Opioid dependence, in remission: Secondary | ICD-10-CM

## 2023-05-03 DIAGNOSIS — Z596 Low income: Secondary | ICD-10-CM | POA: Diagnosis not present

## 2023-05-03 DIAGNOSIS — F4310 Post-traumatic stress disorder, unspecified: Secondary | ICD-10-CM

## 2023-05-03 DIAGNOSIS — Z79899 Other long term (current) drug therapy: Secondary | ICD-10-CM | POA: Diagnosis not present

## 2023-05-03 DIAGNOSIS — F1420 Cocaine dependence, uncomplicated: Secondary | ICD-10-CM

## 2023-05-03 DIAGNOSIS — Z5900 Homelessness unspecified: Secondary | ICD-10-CM | POA: Diagnosis not present

## 2023-05-03 DIAGNOSIS — F1520 Other stimulant dependence, uncomplicated: Secondary | ICD-10-CM | POA: Diagnosis not present

## 2023-05-03 DIAGNOSIS — F1110 Opioid abuse, uncomplicated: Secondary | ICD-10-CM | POA: Diagnosis present

## 2023-05-03 DIAGNOSIS — F1220 Cannabis dependence, uncomplicated: Secondary | ICD-10-CM

## 2023-05-03 DIAGNOSIS — F19959 Other psychoactive substance use, unspecified with psychoactive substance-induced psychotic disorder, unspecified: Secondary | ICD-10-CM

## 2023-05-03 DIAGNOSIS — Z8249 Family history of ischemic heart disease and other diseases of the circulatory system: Secondary | ICD-10-CM

## 2023-05-03 LAB — POCT URINE DRUG SCREEN - MANUAL ENTRY (I-SCREEN)
POC Amphetamine UR: POSITIVE — AB
POC Buprenorphine (BUP): POSITIVE — AB
POC Cocaine UR: POSITIVE — AB
POC Marijuana UR: POSITIVE — AB
POC Methadone UR: NOT DETECTED
POC Methamphetamine UR: POSITIVE — AB
POC Morphine: NOT DETECTED
POC Oxazepam (BZO): POSITIVE — AB
POC Oxycodone UR: POSITIVE — AB
POC Secobarbital (BAR): NOT DETECTED

## 2023-05-03 LAB — COMPREHENSIVE METABOLIC PANEL
ALT: 21 U/L (ref 0–44)
AST: 19 U/L (ref 15–41)
Albumin: 3.4 g/dL — ABNORMAL LOW (ref 3.5–5.0)
Alkaline Phosphatase: 55 U/L (ref 38–126)
Anion gap: 4 — ABNORMAL LOW (ref 5–15)
BUN: 8 mg/dL (ref 6–20)
CO2: 29 mmol/L (ref 22–32)
Calcium: 8.6 mg/dL — ABNORMAL LOW (ref 8.9–10.3)
Chloride: 101 mmol/L (ref 98–111)
Creatinine, Ser: 0.74 mg/dL (ref 0.44–1.00)
GFR, Estimated: >60 mL/min (ref >60)
Glucose, Bld: 93 mg/dL (ref 70–99)
Potassium: 3.6 mmol/L (ref 3.5–5.1)
Sodium: 134 mmol/L — ABNORMAL LOW (ref 135–145)
Total Bilirubin: 0.4 mg/dL (ref <1.2)
Total Protein: 6.2 g/dL — ABNORMAL LOW (ref 6.5–8.1)

## 2023-05-03 LAB — LIPID PANEL
Cholesterol: 150 mg/dL (ref 0–200)
HDL: 47 mg/dL (ref >40)
LDL Cholesterol: 74 mg/dL (ref 0–99)
Total CHOL/HDL Ratio: 3.2 {ratio}
Triglycerides: 145 mg/dL (ref <150)
VLDL: 29 mg/dL (ref 0–40)

## 2023-05-03 LAB — CBC WITH DIFFERENTIAL/PLATELET
Abs Immature Granulocytes: 0.01 10*3/uL (ref 0.00–0.07)
Basophils Absolute: 0 10*3/uL (ref 0.0–0.1)
Basophils Relative: 0 %
Eosinophils Absolute: 0.1 10*3/uL (ref 0.0–0.5)
Eosinophils Relative: 1 %
HCT: 33.7 % — ABNORMAL LOW (ref 36.0–46.0)
Hemoglobin: 11 g/dL — ABNORMAL LOW (ref 12.0–15.0)
Immature Granulocytes: 0 %
Lymphocytes Relative: 30 %
Lymphs Abs: 2 10*3/uL (ref 0.7–4.0)
MCH: 27.4 pg (ref 26.0–34.0)
MCHC: 32.6 g/dL (ref 30.0–36.0)
MCV: 84 fL (ref 80.0–100.0)
Monocytes Absolute: 0.5 10*3/uL (ref 0.1–1.0)
Monocytes Relative: 7 %
Neutro Abs: 4.2 10*3/uL (ref 1.7–7.7)
Neutrophils Relative %: 62 %
Platelets: 158 10*3/uL (ref 150–400)
RBC: 4.01 MIL/uL (ref 3.87–5.11)
RDW: 13.5 % (ref 11.5–15.5)
WBC: 6.8 10*3/uL (ref 4.0–10.5)
nRBC: 0 % (ref 0.0–0.2)

## 2023-05-03 LAB — POC URINE PREG, ED: Preg Test, Ur: NEGATIVE

## 2023-05-03 LAB — ETHANOL: Alcohol, Ethyl (B): <10 mg/dL (ref <10)

## 2023-05-03 LAB — TSH: TSH: 0.332 u[IU]/mL — ABNORMAL LOW (ref 0.350–4.500)

## 2023-05-03 MED ORDER — LORAZEPAM 2 MG/ML IJ SOLN: 2.0000 mg | Freq: Three times a day (TID) | INTRAMUSCULAR | Status: DC | PRN

## 2023-05-03 MED ORDER — BUPRENORPHINE HCL-NALOXONE HCL 8-2 MG SL SUBL: 1.0000 | SUBLINGUAL_TABLET | Freq: Three times a day (TID) | SUBLINGUAL | Status: DC

## 2023-05-03 MED ORDER — ALUM & MAG HYDROXIDE-SIMETH 200-200-20 MG/5ML PO SUSP: 30.0000 mL | ORAL | Status: DC | PRN

## 2023-05-03 MED ORDER — ACETAMINOPHEN 325 MG PO TABS: 650.0000 mg | ORAL_TABLET | Freq: Four times a day (QID) | ORAL | Status: DC | PRN

## 2023-05-03 MED ORDER — MAGNESIUM HYDROXIDE 400 MG/5ML PO SUSP: 30.0000 mL | Freq: Every day | ORAL | Status: DC | PRN

## 2023-05-03 MED ORDER — HALOPERIDOL 5 MG PO TABS: 5.0000 mg | ORAL_TABLET | Freq: Three times a day (TID) | ORAL | Status: DC | PRN

## 2023-05-03 MED ORDER — HYDROXYZINE HCL 25 MG PO TABS: 25.0000 mg | ORAL_TABLET | Freq: Three times a day (TID) | ORAL | Status: DC | PRN

## 2023-05-03 MED ORDER — NICOTINE 14 MG/24HR TD PT24
14.0000 mg | MEDICATED_PATCH | Freq: Every day | TRANSDERMAL | Status: DC
  Administered 2023-05-04 – 2023-05-10 (×7): 14 mg via TRANSDERMAL
  Filled 2023-05-03 (×8): qty 1

## 2023-05-03 MED ORDER — TRAZODONE HCL 50 MG PO TABS: 50.0000 mg | ORAL_TABLET | Freq: Every evening | ORAL | Status: DC | PRN

## 2023-05-03 MED ORDER — DIPHENHYDRAMINE HCL 50 MG/ML IJ SOLN: 50.0000 mg | Freq: Three times a day (TID) | INTRAMUSCULAR | Status: DC | PRN

## 2023-05-03 MED ORDER — BUPRENORPHINE HCL-NALOXONE HCL 8-2 MG SL SUBL
1.0000 | SUBLINGUAL_TABLET | Freq: Three times a day (TID) | SUBLINGUAL | Status: DC
  Administered 2023-05-04 – 2023-05-10 (×19): 1 via SUBLINGUAL
  Filled 2023-05-03 (×19): qty 1

## 2023-05-03 MED ORDER — TRAZODONE HCL 50 MG PO TABS
50.0000 mg | ORAL_TABLET | Freq: Every evening | ORAL | Status: DC | PRN
  Administered 2023-05-04 – 2023-05-07 (×3): 50 mg via ORAL
  Filled 2023-05-03 (×3): qty 1

## 2023-05-03 MED ORDER — PAROXETINE HCL 10 MG PO TABS
10.0000 mg | ORAL_TABLET | Freq: Every day | ORAL | Status: DC
  Administered 2023-05-04 – 2023-05-05 (×2): 10 mg via ORAL
  Filled 2023-05-03 (×3): qty 1

## 2023-05-03 MED ORDER — DIPHENHYDRAMINE HCL 25 MG PO CAPS: 50.0000 mg | ORAL_CAPSULE | Freq: Three times a day (TID) | ORAL | Status: DC | PRN

## 2023-05-03 MED ORDER — HALOPERIDOL LACTATE 5 MG/ML IJ SOLN: 5.0000 mg | Freq: Three times a day (TID) | INTRAMUSCULAR | Status: DC | PRN

## 2023-05-03 MED ORDER — PAROXETINE HCL 10 MG PO TABS: 10.0000 mg | ORAL_TABLET | Freq: Every day | ORAL | Status: DC

## 2023-05-03 NOTE — ED Notes (Signed)
Ptadmitted to ob  endorsing  passive SI/ denies HI/AH  endorses VH. Calm, cooperative throughout interview process. Skin assessment completed. Oriented to unit. Meal and drink offered.  Pt verbally contract for safety. Will monitor for safety.

## 2023-05-03 NOTE — ED Notes (Signed)
Report called to RN Erskine Squibb, [facility].  Librarian, academic.

## 2023-05-03 NOTE — ED Notes (Signed)
Pt A&O x 4, sleeping at present,no distress noted.  Pending review of labs , report & transfer to [facility].

## 2023-05-03 NOTE — Tx Team (Signed)
Initial Treatment Plan 05/03/2023 10:40 PM DHRITI SOLOMONS UEA:[id number]    PATIENT STRESSORS: Financial difficulties   Legal issue   Occupational concerns   Substance abuse     PATIENT STRENGTHS: Active sense of humor  Average or above average intelligence  Motivation for treatment/growth    PATIENT IDENTIFIED PROBLEMS: Suicide ideation  Substance use  "Mental state"   "Housing"               DISCHARGE CRITERIA:  Ability to meet basic life and health needs Adequate post-discharge living arrangements Improved stabilization in mood, thinking, and/or behavior Medical problems require only outpatient monitoring  PRELIMINARY DISCHARGE PLAN: Attend aftercare/continuing care group Attend PHP/IOP Outpatient therapy Placement in alternative living arrangements  PATIENT/FAMILY INVOLVEMENT: This treatment plan has been presented to and reviewed with the patient, Stark Jock, and/or family member.  The patient and family have been given the opportunity to ask questions and make suggestions.  Bethann Punches, RN 05/03/2023, 10:40 PM

## 2023-05-03 NOTE — ED Provider Notes (Signed)
Behavioral Health Urgent Care Medical Screening Exam  Patient Name: Hailey Perez MRN: [id number] Date of Evaluation: 05/03/23 Chief Complaint:  SI Diagnosis:  Final diagnoses:  Severe episode of recurrent major depressive disorder, without psychotic features (HCC)    History of Present illness: Hailey Perez is a 47 y.o. female patient presented to [facility] as a walk in voluntarily accompanied by self with complaints of recent substance use, life stressors, and suicidal ideations.   Hailey Perez, 47 y.o., female patient seen face to face by this provider, consulted with Dr. Clovis Riley; and chart reviewed on 05/03/23.  On evaluation Hailey Perez reports that she has been struggling with her mental health for the past few weeks. Pt does endorse being homeless, she is on the low income housing waitlist but has not heard anything about it for months. She has had trouble finding jobs, and recently had to start prostituting again for income which has really affected her self esteem. Pt stated she is feeling hopeless, depressed, shameful, and does not see a point in living. She does endorse occasional meth use, last use around 1 week. She has no family or friend support, feels very isolated.   Pt does endorse passive suicidal ideations, but does not have a certain plan or intent. She denies HI. Denies AVH. She does mention seeing a therapist and had her second visit with said therapist today. She does mention previously taking Paxil in the past, stated it worked okay for her. She would be agreeable to restarting medication. Ultimately, patient was not able to contract for safety, and is concerned she will try and harm herself if discharged today. Will recommend inpatient psychiatric treatment. Pt has been accepted to [facility] pending lab work.   During evaluation Hailey Perez is sitting in no acute distress.  She is alert, oriented x 4, calm, cooperative and attentive.  Her mood is depressed and hopeless  with congruent affect.  She has normal speech, and behavior.  Objectively there is no evidence of psychosis/mania or delusional thinking.  Patient is able to converse coherently, goal directed thoughts, no distractibility, or pre-occupation.  She also denies homicidal ideation, psychosis, and paranoia.  Patient answered question appropriately.     Flowsheet Row ED from 05/03/2023 in [facility] ED from 12/07/2022 in [facility]  C-SSRS RISK CATEGORY No Risk No Risk       Psychiatric Specialty Exam  Presentation  General Appearance:Appropriate for Environment  Eye Contact:Good  Speech:Clear and Coherent  Speech Volume:Normal  Handedness:Right   Mood and Affect  Mood: Anxious  Affect: Congruent   Thought Process  Thought Processes: Goal Directed  Descriptions of Associations:Intact  Orientation:Full (Time, Place and Person)  Thought Content:WDL  Diagnosis of Schizophrenia or Schizoaffective disorder in past: No   Hallucinations:Auditory Patient states she hears her mother, ex-boyfriend, and other people's voices talking about her  Ideas of Reference:None  Suicidal Thoughts:Yes, Passive Without Intent; Without Plan  Homicidal Thoughts:No   Sensorium  Memory: Immediate Fair; Recent Fair  Judgment: Fair  Insight: Fair   Chartered certified accountant: Fair  Attention Span: Fair  Recall: [employer] of Knowledge: Fair  Language: Fair   Psychomotor Activity  Psychomotor Activity: Normal   Assets  Assets: Desire for Improvement; Physical Health; Leisure Time   Sleep  Sleep: Fair  Number of hours:  9   Physical Exam: Physical Exam Neurological:     Mental Status: She is alert  and oriented to person, place, and time.  Psychiatric:        Attention and Perception: Attention normal.        Mood and Affect: Mood is anxious and depressed. Affect is tearful.         Speech: Speech normal.        Behavior: Behavior is cooperative.        Thought Content: Thought content includes suicidal ideation.    Review of Systems  Psychiatric/Behavioral:  Positive for depression, substance abuse and suicidal ideas. The patient is nervous/anxious.   All other systems reviewed and are negative.  Blood pressure 98/60, pulse 62, temperature 97.7 F (36.5 C), temperature source Oral, resp. rate 18, SpO2 99%. There is no height or weight on file to calculate BMI.  Musculoskeletal: Strength & Muscle Tone: within normal limits Gait & Station: normal Patient leans: N/A   [facility] MSE Discharge Disposition for Follow up and Recommendations: Based on my evaluation I certify that psychiatric inpatient services furnished can reasonably be expected to improve the patient's condition which I recommend transfer to an appropriate accepting facility.   Pt has been accepted to [facility] for IP treatment pending lab work.   Will start Paxil 10 mg  Continue Suboxone 8-2 mg TID   Eligha Bridegroom, NP 05/03/2023, 5:57 PM

## 2023-05-03 NOTE — Progress Notes (Signed)
THERAPIST PROGRESS NOTE  Session Time: 3:02 p.m. to 4 p.m.    Type of Therapy: Individual   Therapist Response/Interventions: Solution Focused and Crisis Intervention/The therapist talks to [place] about treatment options and recommends that she go to detox after getting the results of her rapid drug screen.  The therapist contacts her insurance company, [employer], and get a list of inpatient substance abuse residential treatment programs that she can go to upon discharging from detox.  The therapist also recommends that upon leaving inpatient residential treatment that she get connected to a sober living program so as to get off the street.  Treatment Goals addressed: Crisis Stabilization  Summary: Anderia presents believing she got kicked out of the tiny home program and kicked out of her uncle's house as he said that she could not come back there. She says she is "definitely" on the street having been on the street for four or five days. She says that she has not done any drugs in three or four days. She says that the laundromat did not call her back. She says that she used a "line of meth." She got money for the room but lost it because she would not trick.  She reports that she has not had any Xanax and is not feeling well. The therapist has the CMA take her vitals and her BP is 113/72 and pulse is 67. The CMA gives her a rapid UDS which is positive for THC, Amphetamines, Benzodiazepines, Buprenorphine, and Methamphetamines.   After talking to the therapist, she is agreeable to going inpatient to detox at the [facility] and then to a residential inpatient substance use treatment program with the hope of stepping down to a sober living program so as to get off the streets.  She continues to exhibit some delusional thinking believing that she is still being live streamed on the Internet.  The therapist speaks with her stepfather who drove her to this appointment who agrees to driver directly to  [facility] behavioral.   Progress Towards Goals: Initial  Suicidal/Homicidal: No SI or HI  Plan: Will go directly to inpatient detox at the [facility].  Diagnosis: Cannabis use disorder, severe, dependence; cocaine use disorder, severe, dependence; severe opioid use disorder, in early remission, on maintenance therapy; psychoactive substance induced psychosis; and posttraumatic stress disorder  Collaboration of Care: Other N/A  Patient/Guardian was advised Release of Information must be obtained prior to any record release in order to collaborate their care with an outside provider. Patient/Guardian was advised if they have not already done so to contact the registration department to sign all necessary forms in order for [place] to release information regarding their care.   Consent: Patient/Guardian gives verbal consent for treatment and assignment of benefits for services provided during this visit. Patient/Guardian expressed understanding and agreed to proceed.   Myrna Blazer, MA, LCSW, [facility], LCAS 05/03/2023
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
## 2023-05-03 NOTE — ED Notes (Signed)
Safe Transport requested.

## 2023-05-03 NOTE — Progress Notes (Signed)
Hailey Perez is a 47 y.o. female voluntarily admitted for suicide ideation with no plan. Pt stated she was kicked out of her apartment list at [facility] for unknown reason. Pt stated she is homeless, jobless, and came out jail in October 2023, stated she does not have any legal issues pending. Pt stated she does not have any support from the family, and looking for help with housing. Pt report use of meth, cocaine, heroin, THC and requesting help on how to quit. Pt has been calm and cooperative with admission process, alert and oriented x 4, denies SI/HI and verbally contracted for safety. Consents signed, skin/belongings search completed and pt oriented to unit. Pt stable at this time. Pt given the opportunity to express concerns and ask questions. Pt given toiletries. Will continue to monitor.

## 2023-05-04 ENCOUNTER — Encounter (HOSPITAL_COMMUNITY): Payer: Self-pay

## 2023-05-04 DIAGNOSIS — F331 Major depressive disorder, recurrent, moderate: Secondary | ICD-10-CM

## 2023-05-04 LAB — URINALYSIS, COMPLETE (UACMP) WITH MICROSCOPIC
Bilirubin Urine: NEGATIVE
Glucose, UA: NEGATIVE mg/dL
Hgb urine dipstick: NEGATIVE
Ketones, ur: NEGATIVE mg/dL
Nitrite: NEGATIVE
Protein, ur: NEGATIVE mg/dL
Specific Gravity, Urine: 1.025 (ref 1.005–1.030)
pH: 5 (ref 5.0–8.0)

## 2023-05-04 MED ORDER — NITROFURANTOIN MONOHYD MACRO 100 MG PO CAPS
100.0000 mg | ORAL_CAPSULE | Freq: Two times a day (BID) | ORAL | Status: DC
  Administered 2023-05-04 – 2023-05-10 (×12): 100 mg via ORAL
  Filled 2023-05-04 (×15): qty 1

## 2023-05-04 NOTE — BH IP Treatment Plan (Signed)
Interdisciplinary Treatment and Diagnostic Plan Update  05/04/2023 Time of Session: 10:35AM Hailey Perez MRN: [id number]  Principal Diagnosis: MDD (major depressive disorder)  Secondary Diagnoses: Principal Problem:   MDD (major depressive disorder)   Current Medications:  Current Facility-Administered Medications  Medication Dose Route Frequency Provider Last Rate Last Admin   acetaminophen (TYLENOL) tablet 650 mg  650 mg Oral Q6H PRN Eligha Bridegroom, NP       alum & mag hydroxide-simeth (MAALOX/MYLANTA) 200-200-20 MG/5ML suspension 30 mL  30 mL Oral Q4H PRN Eligha Bridegroom, NP       buprenorphine-naloxone (SUBOXONE) 8-2 mg per SL tablet 1 tablet  1 tablet Sublingual TID Eligha Bridegroom, NP   1 tablet at 05/04/23 1144   haloperidol (HALDOL) tablet 5 mg  5 mg Oral TID PRN Eligha Bridegroom, NP       And   diphenhydrAMINE (BENADRYL) capsule 50 mg  50 mg Oral TID PRN Eligha Bridegroom, NP       haloperidol lactate (HALDOL) injection 5 mg  5 mg Intramuscular TID PRN Eligha Bridegroom, NP       And   diphenhydrAMINE (BENADRYL) injection 50 mg  50 mg Intramuscular TID PRN Eligha Bridegroom, NP       And   LORazepam (ATIVAN) injection 2 mg  2 mg Intramuscular TID PRN Eligha Bridegroom, NP       hydrOXYzine (ATARAX) tablet 25 mg  25 mg Oral TID PRN Eligha Bridegroom, NP       magnesium hydroxide (MILK OF MAGNESIA) suspension 30 mL  30 mL Oral Daily PRN Eligha Bridegroom, NP       nicotine (NICODERM CQ - dosed in mg/24 hours) patch 14 mg  14 mg Transdermal Daily Sindy Guadeloupe, NP   14 mg at 05/04/23 0853   PARoxetine (PAXIL) tablet 10 mg  10 mg Oral Daily Eligha Bridegroom, NP   10 mg at 05/04/23 0853   traZODone (DESYREL) tablet 50 mg  50 mg Oral QHS PRN Eligha Bridegroom, NP       PTA Medications: Medications Prior to Admission  Medication Sig Dispense Refill Last Dose   FLUoxetine (PROZAC) 20 MG capsule Take 40 mg by mouth daily.      nitrofurantoin, macrocrystal-monohydrate, (MACROBID)  100 MG capsule Take 100 mg by mouth 2 (two) times daily.      SUBOXONE 8-2 MG FILM Place 1 Film under the tongue in the morning, at noon, and at bedtime.       Patient Stressors: Neurosurgeon issue   Occupational concerns   Substance abuse    Patient Strengths: Active sense of humor  Average or above average intelligence  Motivation for treatment/growth   Treatment Modalities: Medication Management, Group therapy, Case management,  1 to 1 session with clinician, Psychoeducation, Recreational therapy.   Physician Treatment Plan for Primary Diagnosis: MDD (major depressive disorder) Long Term Goal(s):     Short Term Goals:    Medication Management: Evaluate patient's response, side effects, and tolerance of medication regimen.  Therapeutic Interventions: 1 to 1 sessions, Unit Group sessions and Medication administration.  Evaluation of Outcomes: Not Progressing  Physician Treatment Plan for Secondary Diagnosis: Principal Problem:   MDD (major depressive disorder)  Long Term Goal(s):     Short Term Goals:       Medication Management: Evaluate patient's response, side effects, and tolerance of medication regimen.  Therapeutic Interventions: 1 to 1 sessions, Unit Group sessions and Medication administration.  Evaluation of Outcomes: Not Progressing  RN Treatment Plan for Primary Diagnosis: MDD (major depressive disorder) Long Term Goal(s): Knowledge of disease and therapeutic regimen to maintain health will improve  Short Term Goals: Ability to remain free from injury will improve, Ability to verbalize frustration and anger appropriately will improve, Ability to demonstrate self-control, Ability to participate in decision making will improve, Ability to verbalize feelings will improve, Ability to disclose and discuss suicidal ideas, Ability to identify and develop effective coping behaviors will improve, and Compliance with prescribed medications will  improve  Medication Management: RN will administer medications as ordered by provider, will assess and evaluate patient's response and provide education to patient for prescribed medication. RN will report any adverse and/or side effects to prescribing provider.  Therapeutic Interventions: 1 on 1 counseling sessions, Psychoeducation, Medication administration, Evaluate responses to treatment, Monitor vital signs and CBGs as ordered, Perform/monitor CIWA, COWS, AIMS and Fall Risk screenings as ordered, Perform wound care treatments as ordered.  Evaluation of Outcomes: Not Progressing   LCSW Treatment Plan for Primary Diagnosis: MDD (major depressive disorder) Long Term Goal(s): Safe transition to appropriate next level of care at discharge, Engage patient in therapeutic group addressing interpersonal concerns.  Short Term Goals: Engage patient in aftercare planning with referrals and resources, Increase social support, Increase ability to appropriately verbalize feelings, Increase emotional regulation, Facilitate acceptance of mental health diagnosis and concerns, Facilitate patient progression through stages of change regarding substance use diagnoses and concerns, Identify triggers associated with mental health/substance abuse issues, and Increase skills for wellness and recovery  Therapeutic Interventions: Assess for all discharge needs, 1 to 1 time with Social worker, Explore available resources and support systems, Assess for adequacy in community support network, Educate family and significant other(s) on suicide prevention, Complete Psychosocial Assessment, Interpersonal group therapy.  Evaluation of Outcomes: Not Progressing   Progress in Treatment: Attending groups: No. Participating in groups: No. Taking medication as prescribed: Yes. Toleration medication: Yes. Family/Significant other contact made: No, will contact:  consent pending Patient understands diagnosis: Yes. Discussing  patient identified problems/goals with staff: Yes. Medical problems stabilized or resolved: Yes. Denies suicidal/homicidal ideation: Yes. Issues/concerns per patient self-inventory: No.   New problem(s) identified: No, Describe:  none reported  New Short Term/Long Term Goal(s): medication stabilization, elimination of SI thoughts, development of comprehensive mental wellness plan.    Patient Goals:  "Lower my depression and get my medications right"  Discharge Plan or Barriers: Patient recently admitted. CSW will continue to follow and assess for appropriate referrals and possible discharge planning.    Reason for Continuation of Hospitalization: Depression Medication stabilization Suicidal ideation  Estimated Length of Stay: 5-7 days  Last 3 [place] Suicide Severity Risk Score: Flowsheet Row Ad[facility] (Current) [facility] Most recent reading at 05/03/2023 10:12 PM ED from 05/03/2023 in [facility] Most recent reading at 05/03/2023  7:26 PM ED from 12/07/2022 in [facility] Most recent reading at 12/07/2022  3:23 PM  C-SSRS RISK CATEGORY Low Risk No Risk No Risk       Last PHQ 2/9 Scores:    04/14/2023    5:22 PM 07/16/2022    3:52 PM  Depression screen PHQ 2/9  Decreased Interest 1 2  Down, Depressed, Hopeless 2 3  PHQ - 2 Score 3 5  Altered sleeping 2 3  Tired, decreased energy 2 2  Change in appetite 2 2  Feeling bad or failure about yourself  2 3  Trouble  concentrating 2 2  Moving slowly or fidgety/restless  0  Suicidal thoughts 0 0  PHQ-9 Score 13 17  Difficult doing work/chores Somewhat difficult Somewhat difficult    Scribe for Treatment Team: Kathi Der, LCSWA 05/04/2023 1:00 PM

## 2023-05-04 NOTE — Plan of Care (Signed)
  Problem: Activity: Goal: Sleeping patterns will improve Outcome: Progressing   Problem: Education: Goal: Knowledge of [facility] Education information/materials will improve Outcome: Not Applicable Goal: Emotional status will improve Outcome: Not Applicable Goal: Mental status will improve Outcome: Not Applicable Goal: Verbalization of understanding the information provided will improve Outcome: Not Applicable   Problem: Activity: Goal: Interest or engagement in activities will improve Outcome: Not Applicable   Problem: Coping: Goal: Ability to verbalize frustrations and anger appropriately will improve Outcome: Not Applicable Goal: Ability to demonstrate self-control will improve Outcome: Not Applicable   Problem: Health Behavior/Discharge Planning: Goal: Identification of resources available to assist in meeting health care needs will improve Outcome: Not Applicable Goal: Compliance with treatment plan for underlying cause of condition will improve Outcome: Not Applicable   Problem: Physical Regulation: Goal: Ability to maintain clinical measurements within normal limits will improve Outcome: Not Applicable   Problem: Safety: Goal: Periods of time without injury will increase Outcome: Not Applicable

## 2023-05-04 NOTE — H&P (Signed)
Psychiatric Admission Assessment Adult  Patient Identification: Hailey Perez MRN:  [id number] Date of Evaluation:  05/04/2023 Chief Complaint:  MDD (major depressive disorder) [F32.9] Principal Diagnosis: MDD (major depressive disorder) Diagnosis:  Principal Problem:   MDD (major depressive disorder) CC: 47 year old female with symptoms of depression, suicidal ideations and recent relapse.  She is a voluntary admission. History of Present Illness: The patient is a 47 year old female who was seen face-to-face initially at the [facility] and on evaluation she reports that she was struggling with ongoing depression for the last few weeks.  She also endorsed feeling homeless and being on the low income housing wait list and has not heard for months.  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  She started feeling helpless, hopeless and shameful and felt that there was no point in living.  She does not endorse having any family support. Patient continued to report passive suicidal ideations with no specific plan.  Apparently she had only 2 appointments with a therapist recently. Patient is on a Suboxone program and although she is in denial of the recent abuse of drugs, she was positive for amphetamines and benzodiazepines cocaine and oxycodone and marijuana.  Patient states that she wants to be clean and would like to go to a long-term program if possible. Past history is significant for depression, drug abuse and recent relapse.  Patient reports that she had been to hold any recently.  She reports that she was also incarcerated recently.  When seen today the patient was alert oriented and cooperative.  She did endorse feelings of depression and admits to feelings of hopelessness and helplessness with loss of interest in activities.  She is able to contract for safety.  She denies any active  SI/HI/AVH. Associated Signs/Symptoms: Depression Symptoms:  depressed mood, psychomotor retardation, fatigue, feelings of worthlessness/guilt, hopelessness, recurrent thoughts of death, (Hypo) Manic Symptoms:  Impulsivity, Anxiety Symptoms:  Excessive Worry, Psychotic Symptoms:   Denied paranoia or hallucinations PTSD Symptoms: Negative Total Time spent with patient: 30 minutes  Past Psychiatric History: Significant for long history of opioid abuse, substance use disorder, depression, prior incarceration and prior treatment at old [place].  Is the patient at risk to self? Yes.    Has the patient been a risk to self in the past 6 months? Yes.    Has the patient been a risk to self within the distant past? Yes.    Is the patient a risk to others? No.  Has the patient been a risk to others in the past 6 months? No.  Has the patient been a risk to others within the distant past? No.   [place] Scale:  Flowsheet Row [facility] (Current[facility] Most recent reading at 05/03/2023 10:12 PM ED from 05/03/2023 in [facility] Most recent reading at 05/03/2023  7:26 PM ED from 12/07/2022 in [facility] Most recent reading at 12/07/2022  3:23 PM  C-SSRS RISK CATEGORY Low Risk No Risk No Risk        Prior Inpatient Therapy: Yes.   If yes, describe recently admitted to old [facility] 6 months ago Prior Outpatient Therapy: Yes.   If yes, describe recently started outpatient counseling.  Alcohol Screening: 1. How often do you have a drink containing alcohol?: Never 2. How many drinks containing alcohol do you have on a  typical day when you are drinking?: 1 or 2 3. How often do you have six or more drinks on one occasion?: Never AUDIT-C Score: 0 4. How often during the last year have you found that you were not able to stop drinking once you had started?: Never 5. How often  during the last year have you failed to do what was normally expected from you because of drinking?: Never 6. How often during the last year have you needed a first drink in the morning to get yourself going after a heavy drinking session?: Never 7. How often during the last year have you had a feeling of guilt of remorse after drinking?: Never 8. How often during the last year have you been unable to remember what happened the night before because you had been drinking?: Never 9. Have you or someone else been injured as a result of your drinking?: No 10. Has a relative or friend or a doctor or another health worker been concerned about your drinking or suggested you cut down?: No Alcohol Use Disorder Identification Test Final Score (AUDIT): 0 Substance Abuse History in the last 12 months:  Yes.   Consequences of Substance Abuse: Worsening depression, recent incarceration and relapses. Previous Psychotropic Medications: Yes  Psychological Evaluations: No  Past Medical History:  Past Medical History:  Diagnosis Date   Substance abuse (HCC)     Past Surgical History:  Procedure Laterality Date   I & D EXTREMITY Right 12/12/2014   Procedure: IRRIGATION AND DEBRIDEMENT EXTREMITY;  Surgeon: Knute Neu, MD;  Location: [facility];  Service: Plastics;  Laterality: Right;   TUBAL LIGATION     Family History:  Family History  Problem Relation Age of Onset   Hypertension Mother    Family Psychiatric  History: Unknown at this time Tobacco Screening:  Social History   Tobacco Use  Smoking Status Every Day   Current packs/day: 0.50   Types: Cigarettes  Smokeless Tobacco Never    [facility] Tobacco Counseling     Are you interested in Tobacco Cessation Medications?  Yes, implement Nicotene Replacement Protocol Counseled patient on smoking cessation:  No value filed. Reason Tobacco Screening Not Completed: No value filed.       Social History:  Social History   Substance and Sexual Activity   Alcohol Use No     Social History   Substance and Sexual Activity  Drug Use Yes   Types: IV, Benzodiazepines, Cocaine, Marijuana, Oxycodone, Methamphetamines   Comment: heroin    Additional Social History:                           Allergies:  No Known Allergies Lab Results:  Results for orders placed or performed during the hospital encounter of 05/03/23 (from the past 48 hour(s))  CBC with Differential/Platelet     Status: Abnormal   Collection Time: 05/03/23  6:07 PM  Result Value Ref Range   WBC 6.8 4.0 - 10.5 K/uL   RBC 4.01 3.87 - 5.11 MIL/uL   Hemoglobin 11.0 (L) 12.0 - 15.0 g/dL   HCT 63.8 (L) 75.6 - 43.3 %   MCV 84.0 80.0 - 100.0 fL   MCH 27.4 26.0 - 34.0 pg   MCHC 32.6 30.0 - 36.0 g/dL   RDW 29.5 18.8 - 41.6 %   Platelets 158 150 - 400 K/uL   nRBC 0.0 0.0 - 0.2 %   Neutrophils Relative % 62 %   Neutro  Abs 4.2 1.7 - 7.7 K/uL   Lymphocytes Relative 30 %   Lymphs Abs 2.0 0.7 - 4.0 K/uL   Monocytes Relative 7 %   Monocytes Absolute 0.5 0.1 - 1.0 K/uL   Eosinophils Relative 1 %   Eosinophils Absolute 0.1 0.0 - 0.5 K/uL   Basophils Relative 0 %   Basophils Absolute 0.0 0.0 - 0.1 K/uL   Immature Granulocytes 0 %   Abs Immature Granulocytes 0.01 0.00 - 0.07 K/uL    Comment: Performed at [facility], [address], [place], [place] [address]  Comprehensive metabolic panel     Status: Abnormal   Collection Time: 05/03/23  6:07 PM  Result Value Ref Range   Sodium 134 (L) 135 - 145 mmol/L   Potassium 3.6 3.5 - 5.1 mmol/L   Chloride 101 98 - 111 mmol/L   CO2 29 22 - 32 mmol/L   Glucose, Bld 93 70 - 99 mg/dL    Comment: Glucose reference range applies only to samples taken after fasting for at least 8 hours.   BUN 8 6 - 20 mg/dL   Creatinine, Ser 6.04 0.44 - 1.00 mg/dL   Calcium 8.6 (L) 8.9 - 10.3 mg/dL   Total Protein 6.2 (L) 6.5 - 8.1 g/dL   Albumin 3.4 (L) 3.5 - 5.0 g/dL   AST 19 15 - 41 U/L   ALT 21 0 - 44 U/L   Alkaline Phosphatase 55 38  - 126 U/L   Total Bilirubin 0.4 <1.2 mg/dL   GFR, Estimated >54 >09 mL/min    Comment: (NOTE) Calculated using the CKD-EPI Creatinine Equation (2021)    Anion gap 4 (L) 5 - 15    Comment: Performed at [facility], [address] [place], [place] [address]  Ethanol     Status: None   Collection Time: 05/03/23  6:07 PM  Result Value Ref Range   Alcohol, Ethyl (B) <10 <10 mg/dL    Comment: (NOTE) Lowest detectable limit for serum alcohol is 10 mg/dL.  For medical purposes only. Performed at [facility], [address] [place], [place] [address]   TSH     Status: Abnormal   Collection Time: 05/03/23  6:07 PM  Result Value Ref Range   TSH 0.332 (L) 0.350 - 4.500 uIU/mL    Comment: Performed by a 3rd Generation assay with a functional sensitivity of <=0.01 uIU/mL. Performed at [facility], [address] [place], [place] [address]   Lipid panel     Status: None   Collection Time: 05/03/23  6:07 PM  Result Value Ref Range   Cholesterol 150 0 - 200 mg/dL   Triglycerides 086 <578 mg/dL   HDL 47 >46 mg/dL   Total CHOL/HDL Ratio 3.2 RATIO   VLDL 29 0 - 40 mg/dL   LDL Cholesterol 74 0 - 99 mg/dL    Comment:        Total Cholesterol/HDL:CHD Risk Coronary Heart Disease Risk Table                     Men   Women  1/2 Average Risk   3.4   3.3  Average Risk       5.0   4.4  2 X Average Risk   9.6   7.1  3 X Average Risk  23.4   11.0        Use the calculated Patient Ratio above and the CHD Risk  Table to determine the patient's CHD Risk.        ATP III CLASSIFICATION (LDL):  <100     mg/dL   Optimal  409-811  mg/dL   Near or Above                    Optimal  130-159  mg/dL   Borderline  914-782  mg/dL   High  >956     mg/dL   Very High Performed at [facility], [address] [place], [place] [address]   POCT Urine Drug Screen - (I-Screen)     Status: Abnormal   Collection Time: 05/03/23  6:19 PM  Result Value Ref Range   POC Amphetamine UR  Positive (A) NONE DETECTED (Cut Off Level 1000 ng/mL)   POC Secobarbital (BAR) None Detected NONE DETECTED (Cut Off Level 300 ng/mL)   POC Buprenorphine (BUP) Positive (A) NONE DETECTED (Cut Off Level 10 ng/mL)   POC Oxazepam (BZO) Positive (A) NONE DETECTED (Cut Off Level 300 ng/mL)   POC Cocaine UR Positive (A) NONE DETECTED (Cut Off Level 300 ng/mL)   POC Methamphetamine UR Positive (A) NONE DETECTED (Cut Off Level 1000 ng/mL)   POC Morphine None Detected NONE DETECTED (Cut Off Level 300 ng/mL)   POC Methadone UR None Detected NONE DETECTED (Cut Off Level 300 ng/mL)   POC Oxycodone UR Positive (A) NONE DETECTED (Cut Off Level 100 ng/mL)   POC Marijuana UR Positive (A) NONE DETECTED (Cut Off Level 50 ng/mL)  POC urine preg, ED     Status: Normal   Collection Time: 05/03/23  6:19 PM  Result Value Ref Range   Preg Test, Ur Negative Negative    Blood Alcohol level:  Lab Results  Component Value Date   ETH <10 05/03/2023   ETH <10 12/07/2022    Metabolic Disorder Labs:  Lab Results  Component Value Date   HGBA1C 5.3 12/07/2022   MPG 105.41 12/07/2022   Lab Results  Component Value Date   PROLACTIN 11.5 12/07/2022   Lab Results  Component Value Date   CHOL 150 05/03/2023   TRIG 145 05/03/2023   HDL 47 05/03/2023   CHOLHDL 3.2 05/03/2023   VLDL 29 05/03/2023   LDLCALC 74 05/03/2023   LDLCALC 114 (H) 12/07/2022    Current Medications: Current Facility-Administered Medications  Medication Dose Route Frequency Provider Last Rate Last Admin   acetaminophen (TYLENOL) tablet 650 mg  650 mg Oral Q6H PRN Eligha Bridegroom, NP       alum & mag hydroxide-simeth (MAALOX/MYLANTA) 200-200-20 MG/5ML suspension 30 mL  30 mL Oral Q4H PRN Eligha Bridegroom, NP       buprenorphine-naloxone (SUBOXONE) 8-2 mg per SL tablet 1 tablet  1 tablet Sublingual TID Eligha Bridegroom, NP   1 tablet at 05/04/23 1144   haloperidol (HALDOL) tablet 5 mg  5 mg Oral TID PRN Eligha Bridegroom, NP       And    diphenhydrAMINE (BENADRYL) capsule 50 mg  50 mg Oral TID PRN Eligha Bridegroom, NP       haloperidol lactate (HALDOL) injection 5 mg  5 mg Intramuscular TID PRN Eligha Bridegroom, NP       And   diphenhydrAMINE (BENADRYL) injection 50 mg  50 mg Intramuscular TID PRN Eligha Bridegroom, NP       And   LORazepam (ATIVAN) injection 2 mg  2 mg Intramuscular TID PRN Eligha Bridegroom, NP       hydrOXYzine (ATARAX)  tablet 25 mg  25 mg Oral TID PRN Eligha Bridegroom, NP       magnesium hydroxide (MILK OF MAGNESIA) suspension 30 mL  30 mL Oral Daily PRN Eligha Bridegroom, NP       nicotine (NICODERM CQ - dosed in mg/24 hours) patch 14 mg  14 mg Transdermal Daily Sindy Guadeloupe, NP   14 mg at 05/04/23 0853   PARoxetine (PAXIL) tablet 10 mg  10 mg Oral Daily Eligha Bridegroom, NP   10 mg at 05/04/23 0853   traZODone (DESYREL) tablet 50 mg  50 mg Oral QHS PRN Eligha Bridegroom, NP       PTA Medications: Medications Prior to Admission  Medication Sig Dispense Refill Last Dose   FLUoxetine (PROZAC) 20 MG capsule Take 40 mg by mouth daily.      nitrofurantoin, macrocrystal-monohydrate, (MACROBID) 100 MG capsule Take 100 mg by mouth 2 (two) times daily.      SUBOXONE 8-2 MG FILM Place 1 Film under the tongue in the morning, at noon, and at bedtime.       Musculoskeletal: Strength & Muscle Tone: within normal limits Gait & Station: normal Patient leans: N/A            Psychiatric Specialty Exam:  Presentation  General Appearance:  Casual; Disheveled  Eye Contact: Fair  Speech: Clear and Coherent  Speech Volume: Decreased  Handedness: Right   Mood and Affect  Mood: Anxious; Depressed  Affect: Restricted   Thought Process  Thought Processes: Coherent  Duration of Psychotic Symptoms:N/A Past Diagnosis of Schizophrenia or Psychoactive disorder: No  Descriptions of Associations:Intact  Orientation:Full (Time, Place and Person)  Thought Content:Perseveration;  Rumination  Hallucinations:Hallucinations: None  Ideas of Reference:None  Suicidal Thoughts:Suicidal Thoughts: Yes, Passive SI Passive Intent and/or Plan: Without Intent; Without Plan  Homicidal Thoughts:Homicidal Thoughts: No   Sensorium  Memory: Immediate Fair; Remote Fair; Recent Fair  Judgment: Poor  Insight: Fair   Chartered certified accountant: Fair  Attention Span: Fair  Recall: [employer] of Knowledge: Fair  Language: Fair   Psychomotor Activity  Psychomotor Activity: Psychomotor Activity: Normal   Assets  Assets: Manufacturing systems engineer; Desire for Improvement   Sleep  Sleep: Sleep: Fair    Physical Exam: Physical Exam ROS Blood pressure 101/85, pulse (!) 56, temperature 97.9 F (36.6 C), temperature source Oral, resp. rate 18, height 5\' 1"  (1.549 m), weight 63 kg, SpO2 97%. Body mass index is 26.26 kg/m.  Treatment Plan Summary: Daily contact with patient to assess and evaluate symptoms and progress in treatment, Medication management, and Plan as noted below. ASSESSMENT:  Diagnoses / Active Problems: Major depression recurrent with suicidal ideations Opioid use disorder, on MAT program using Suboxone Substance use disorder  PLAN: Safety and Monitoring:  --  Voluntary admission to inpatient psychiatric unit for safety, stabilization and treatment  -- Daily contact with patient to assess and evaluate symptoms and progress in treatment  -- Patient's case to be discussed in multi-disciplinary team meeting  -- Observation Level : q15 minute checks  -- Vital signs:  q12 hours  -- Precautions: suicide, elopement, and assault  2. Psychiatric Diagnoses and Treatment:    --  The risks/benefits/side-effects/alternatives to this medication were discussed in detail with the patient and time was given for questions. The patient consents to medication trial.  --Paxil 10 mg a day -- Suboxone 8-2 mg sublingual 1 tablet a day  --  Metabolic profile and EKG monitoring obtained while on an atypical antipsychotic (BMI:  Lipid Panel: HbgA1c: QTc:) as indicated  -- Encouraged patient to participate in unit milieu and in scheduled group therapies   -- Short Term Goals: Ability to identify changes in lifestyle to reduce recurrence of condition will improve, Ability to disclose and discuss suicidal ideas, Ability to demonstrate self-control will improve, and Compliance with prescribed medications will improve  -- Long Term Goals: Improvement in symptoms so as ready for discharge    3. Medical Issues Being Addressed:   Tobacco Use Disorder  -- Nicotine patch 21mg /24 hours ordered  -- Smoking cessation encouraged  4. Discharge Planning:   -- Social work and case management to assist with discharge planning and identification of hospital follow-up needs prior to discharge  -- Estimated LOS: 5-7 days  -- Discharge Concerns: Need to establish a safety plan; Medication compliance and effectiveness  -- Discharge Goals: Return home with outpatient referrals for mental health follow-up including medication management/psychotherapy   Physician Treatment Plan for Primary Diagnosis: MDD (major depressive disorder)  Physician Treatment Plan for Secondary Diagnosis: Principal Problem:   MDD (major depressive disorder)   I certify that inpatient services furnished can reasonably be expected to improve the patient's condition.    Rex Kras, MD 11/27/202412:43 PM
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
## 2023-05-04 NOTE — Progress Notes (Signed)
     05/04/2023       4:08 PM   Hailey Perez   Type of Note: [facility] [employer]  Referral for [facility] was faxed over this afternoon.    Signed:  Karthik Whittinghill, LCSW-A 05/04/2023  4:08 PM

## 2023-05-04 NOTE — BHH Group Notes (Signed)
Pt did not attend NA group 

## 2023-05-04 NOTE — Group Note (Signed)
Recreation Therapy Group Note   Group Topic:Team Building  Group Date: 05/04/2023 Start Time: 0935 End Time: 1015 Facilitators: Coleston Dirosa-McCall, LRT,CTRS Location: [address] Dayroom   Group Topic: Communication, Team Building, Problem Solving  Goal Area(s) Addresses:  Patient will effectively work with peer towards shared goal.  Patient will identify skills used to make activity successful.  Patient will identify how skills used during activity can be used to reach post d/c goals.   Intervention: STEM Activity  Group Description: Straw Bridge. In teams of 3-5, patients were given 15 plastic drinking straws and an equal length of masking tape. Using the materials provided, patients were instructed to build a free standing bridge-like structure to suspend an everyday item (ex: puzzle box) off of the floor or table surface. All materials were required to be used by the team in their design. LRT facilitated post-activity discussion reviewing team process. Patients were encouraged to reflect how the skills used in this activity can be generalized to daily life post discharge.   Education: Pharmacist, community, Scientist, physiological, Discharge Planning   Education Outcome: Acknowledges education/In group clarification offered/Needs additional education.    Affect/Mood: N/A   Participation Level: Did not attend    Clinical Observations/Individualized Feedback:     Plan: Continue to engage patient in RT group sessions 2-3x/week.   Hailey Perez, LRT,CTRS 05/04/2023 12:16 PM

## 2023-05-04 NOTE — Progress Notes (Addendum)
Pt denied SI/HI/AVH this morning. Pt rated her depression a 10/10, anxiety a 5/10. Pt reports that she does not feel well this morning and is having lower back pain from a kidney infection, MD made aware. Pt has been pleasant, calm, and cooperative throughout the shift. RN provided support and encouragement to patient. Pt given scheduled medications as prescribed. Q15 min checks verified for safety. Patient verbally contracts for safety. Patient compliant with medications and treatment plan. Patient is interacting well on the unit. Pt is safe on the unit.   05/04/23 0900  Psych Admission Type (Psych Patients Only)  Admission Status Voluntary  Psychosocial Assessment  Patient Complaints Anxiety;Depression;Sleep disturbance  Eye Contact Fair  Facial Expression Anxious;Worried  Affect Anxious;Depressed  Oncologist Cooperative;Anxious  Mood Depressed;Anxious  Thought Process  Coherency WDL  Content WDL  Delusions None reported or observed  Perception WDL  Hallucination None reported or observed  Judgment Impaired  Confusion None  Danger to Self  Current suicidal ideation? Denies  Description of Suicide Plan No plan  Self-Injurious Behavior No self-injurious ideation or behavior indicators observed or expressed   Agreement Not to Harm Self Yes  Description of Agreement Verbal  Danger to Others  Danger to Others None reported or observed

## 2023-05-04 NOTE — Plan of Care (Signed)
  Problem: Activity: Goal: Sleeping patterns will improve 05/04/2023 0939 by Huntley Dec, RN Outcome: Progressing 05/04/2023 0938 by Huntley Dec, RN Outcome: Progressing   Problem: Education: Goal: Emotional status will improve Outcome: Progressing Goal: Mental status will improve Outcome: Progressing   Problem: Activity: Goal: Interest or engagement in activities will improve Outcome: Progressing

## 2023-05-05 DIAGNOSIS — F331 Major depressive disorder, recurrent, moderate: Secondary | ICD-10-CM | POA: Diagnosis not present

## 2023-05-05 MED ORDER — PAROXETINE HCL 20 MG PO TABS
20.0000 mg | ORAL_TABLET | Freq: Every day | ORAL | Status: DC
  Administered 2023-05-06 – 2023-05-09 (×4): 20 mg via ORAL
  Filled 2023-05-05 (×6): qty 1

## 2023-05-05 NOTE — Discharge Planning (Signed)
LCSW contacted [facility] [employer] Admission regarding referral. LCSW spoke with [place] [facility] [place] who reports referral was received and is currently under review. Facility will call LCSW number once update has been provided. Patient will likely have to call in to complete phone screen if appropriate. LCSW will provide updates as received.   Hailey Boyden, LCSW Clinical Social Worker [facility]

## 2023-05-05 NOTE — Plan of Care (Signed)
  Problem: Education: Goal: Emotional status will improve Outcome: Progressing Goal: Mental status will improve Outcome: Progressing   Problem: Activity: Goal: Interest or engagement in activities will improve Outcome: Progressing Goal: Sleeping patterns will improve Outcome: Progressing

## 2023-05-05 NOTE — Progress Notes (Addendum)
Pt denied SI/HI/AVH this morning. Pt rated her depression a 8/10, anxiety a 5/10. Pt has been pleasant, calm, and cooperative throughout the shift. RN provided support and encouragement to patient. Pt given scheduled medications as prescribed. Q15 min checks verified for safety. Patient verbally contracts for safety. Patient compliant with medications and treatment plan. Patient is isolative to her room for most of the day. Pt is safe on the unit.   05/05/23 0900  Psych Admission Type (Psych Patients Only)  Admission Status Voluntary  Psychosocial Assessment  Patient Complaints Anxiety;Depression  Eye Contact Fair  Facial Expression Anxious;Sad  Affect Anxious;Sad  Speech Logical/coherent;Soft;Slow  Interaction Guarded  Motor Activity Slow  Appearance/Hygiene Disheveled  Behavior Characteristics Cooperative;Appropriate to situation  Mood Anxious;Depressed  Thought Process  Coherency WDL  Content WDL  Delusions WDL  Perception WDL  Hallucination None reported or observed  Judgment Impaired  Confusion None  Danger to Self  Current suicidal ideation? Denies  Description of Suicide Plan No plan  Self-Injurious Behavior No self-injurious ideation or behavior indicators observed or expressed   Agreement Not to Harm Self Yes  Description of Agreement Verbal  Danger to Others  Danger to Others None reported or observed

## 2023-05-05 NOTE — Progress Notes (Signed)
   05/05/23 0602  15 Minute Checks  Location Bedroom  Visual Appearance Calm  Behavior Composed  Sleep (Behavioral Health Patients Only)  Calculate sleep? (Click Yes once per 24 hr at 0600 safety check) Yes  Documented sleep last 24 hours 10

## 2023-05-05 NOTE — Progress Notes (Signed)
Psychiatric progress note adult  Patient Identification: Hailey Perez MRN:  [id number] Date of Evaluation:  05/05/2023 Chief Complaint:  MDD (major depressive disorder) [F32.9] Principal Diagnosis: MDD (major depressive disorder) Diagnosis:  Principal Problem:   MDD (major depressive disorder)  Reason for admission   The patient is a 47 year old female with symptoms of depression, suicidal ideations and recent relapse on drugs.  She is a voluntary admission.  She was positive for multiple drugs on admission.  She is also on Suboxone.  Chart review from last 24 hours   Staff reports that the patient has been cooperative and compliant.  She slept 7 hours.  She took her medications as prescribed.  No acute signs and symptoms of withdrawals are noted. Patient received trazodone as a as needed. Yesterday, the psychiatry team made the following recommendations:  Paxil 10 mg a day Suboxone 8-2 mg sublingual 1 tablet a day Macrobid 100 mg twice a day NicoDerm patch 14 mg a day  Information obtained during interview    The patient was seen and evaluated and chart was reviewed.  She was alert and oriented cooperative and pleasant.  She maintained fair to good eye contact.  She continues to endorse ongoing symptoms of depression and feelings of helplessness and hopelessness.  She rates her depression at a 9/10.  She denies any active suicidal ideations.  She is contracting for safety.  She does want to consider going to a treatment center upon discharge.  The plan is to increase Paxil to 20 mg a day. Consider referral to [facility].  Associated Signs/Symptoms: Depression Symptoms:  depressed mood, psychomotor retardation, fatigue, feelings of worthlessness/guilt, hopelessness, recurrent thoughts of death, (Hypo) Manic Symptoms:  Impulsivity, Anxiety Symptoms:  Excessive Worry, Psychotic Symptoms:   Denied paranoia or hallucinations PTSD Symptoms: Negative Total Time  spent with patient: 30 minutes  Past Psychiatric History: Significant for long history of opioid abuse, substance use disorder, depression, prior incarceration and prior treatment at old [place].  Is the patient at risk to self? Yes.    Has the patient been a risk to self in the past 6 months? Yes.    Has the patient been a risk to self within the distant past? Yes.    Is the patient a risk to others? No.  Has the patient been a risk to others in the past 6 months? No.  Has the patient been a risk to others within the distant past? No.   [place] Scale:  Flowsheet Row [facility] Most recent reading at 05/03/2023 10:12 PM ED from 05/03/2023 in [facility] Most recent reading at 05/03/2023  7:26 PM ED from 12/07/2022 in [facility] Most recent reading at 12/07/2022  3:23 PM  C-SSRS RISK CATEGORY Low Risk No Risk No Risk        Prior Inpatient Therapy: Yes.   If yes, describe recently admitted to old [facility] 6 months ago Prior Outpatient Therapy: Yes.   If yes, describe recently started outpatient counseling.  Alcohol Screening: 1. How often do you have a drink containing alcohol?: Never 2. How many drinks containing alcohol do you have on a typical day when you are drinking?: 1 or 2 3. How often do you have six or more drinks on one occasion?: Never AUDIT-C Score: 0 4. How often during the last year have you found that you were not able to stop drinking once  you had started?: Never 5. How often during the last year have you failed to do what was normally expected from you because of drinking?: Never 6. How often during the last year have you needed a first drink in the morning to get yourself going after a heavy drinking session?: Never 7. How often during the last year have you had a feeling of guilt of remorse after drinking?: Never 8. How often  during the last year have you been unable to remember what happened the night before because you had been drinking?: Never 9. Have you or someone else been injured as a result of your drinking?: No 10. Has a relative or friend or a doctor or another health worker been concerned about your drinking or suggested you cut down?: No Alcohol Use Disorder Identification Test Final Score (AUDIT): 0 Substance Abuse History in the last 12 months:  Yes.   Consequences of Substance Abuse: Worsening depression, recent incarceration and relapses. Previous Psychotropic Medications: Yes  Psychological Evaluations: No  Past Medical History:  Past Medical History:  Diagnosis Date   Substance abuse (HCC)     Past Surgical History:  Procedure Laterality Date   I & D EXTREMITY Right 12/12/2014   Procedure: IRRIGATION AND DEBRIDEMENT EXTREMITY;  Surgeon: Knute Neu, MD;  Location: [facility];  Service: Plastics;  Laterality: Right;   TUBAL LIGATION     Family History:  Family History  Problem Relation Age of Onset   Hypertension Mother    Family Psychiatric  History: Unknown at this time Tobacco Screening:  Social History   Tobacco Use  Smoking Status Every Day   Current packs/day: 0.50   Types: Cigarettes  Smokeless Tobacco Never    BH Tobacco Counseling     Are you interested in Tobacco Cessation Medications?  Yes, implement Nicotene Replacement Protocol Counseled patient on smoking cessation:  No value filed. Reason Tobacco Screening Not Completed: No value filed.       Social History:  Social History   Substance and Sexual Activity  Alcohol Use No     Social History   Substance and Sexual Activity  Drug Use Yes   Types: IV, Benzodiazepines, Cocaine, Marijuana, Oxycodone, Methamphetamines   Comment: heroin    Additional Social History: Marital status: Separated Number of Years Married: 20 Separated, when?: 20 years What types of issues is patient dealing with in the  relationship?: Patient reports no contact with husband Are you sexually active?: No What is your sexual orientation?: heterosexual Has your sexual activity been affected by drugs, alcohol, medication, or emotional stress?: no Does patient have children?: Yes How many children?: 2 How is patient's relationship with their children?: Patient reports she does not have a relationship with her younger son who is 34; Patient reports she occasionally talks with her oldest son who is 62; Both live in [place], [place]                         Allergies:  No Known Allergies Lab Results:  Results for orders placed or performed during the hospital encounter of 05/03/23 (from the past 48 hour(s))  CBC with Differential/Platelet     Status: Abnormal   Collection Time: 05/03/23  6:07 PM  Result Value Ref Range   WBC 6.8 4.0 - 10.5 K/uL   RBC 4.01 3.87 - 5.11 MIL/uL   Hemoglobin 11.0 (L) 12.0 - 15.0 g/dL   HCT 81.1 (L) 91.4 - 78.2 %   MCV  84.0 80.0 - 100.0 fL   MCH 27.4 26.0 - 34.0 pg   MCHC 32.6 30.0 - 36.0 g/dL   RDW 30.1 60.1 - 09.3 %   Platelets 158 150 - 400 K/uL   nRBC 0.0 0.0 - 0.2 %   Neutrophils Relative % 62 %   Neutro Abs 4.2 1.7 - 7.7 K/uL   Lymphocytes Relative 30 %   Lymphs Abs 2.0 0.7 - 4.0 K/uL   Monocytes Relative 7 %   Monocytes Absolute 0.5 0.1 - 1.0 K/uL   Eosinophils Relative 1 %   Eosinophils Absolute 0.1 0.0 - 0.5 K/uL   Basophils Relative 0 %   Basophils Absolute 0.0 0.0 - 0.1 K/uL   Immature Granulocytes 0 %   Abs Immature Granulocytes 0.01 0.00 - 0.07 K/uL    Comment: Performed at [facility], [address], [place], [place] [address]  Comprehensive metabolic panel     Status: Abnormal   Collection Time: 05/03/23  6:07 PM  Result Value Ref Range   Sodium 134 (L) 135 - 145 mmol/L   Potassium 3.6 3.5 - 5.1 mmol/L   Chloride 101 98 - 111 mmol/L   CO2 29 22 - 32 mmol/L   Glucose, Bld 93 70 - 99 mg/dL    Comment: Glucose reference range applies only to  samples taken after fasting for at least 8 hours.   BUN 8 6 - 20 mg/dL   Creatinine, Ser 3.22 0.44 - 1.00 mg/dL   Calcium 8.6 (L) 8.9 - 10.3 mg/dL   Total Protein 6.2 (L) 6.5 - 8.1 g/dL   Albumin 3.4 (L) 3.5 - 5.0 g/dL   AST 19 15 - 41 U/L   ALT 21 0 - 44 U/L   Alkaline Phosphatase 55 38 - 126 U/L   Total Bilirubin 0.4 <1.2 mg/dL   GFR, Estimated >02 >54 mL/min    Comment: (NOTE) Calculated using the CKD-EPI Creatinine Equation (2021)    Anion gap 4 (L) 5 - 15    Comment: Performed at [facility], [address], [place], [place] [address]  Ethanol     Status: None   Collection Time: 05/03/23  6:07 PM  Result Value Ref Range   Alcohol, Ethyl (B) <10 <10 mg/dL    Comment: (NOTE) Lowest detectable limit for serum alcohol is 10 mg/dL.  For medical purposes only. Performed at [facility], [address], [place], [place] [address]   TSH     Status: Abnormal   Collection Time: 05/03/23  6:07 PM  Result Value Ref Range   TSH 0.332 (L) 0.350 - 4.500 uIU/mL    Comment: Performed by a 3rd Generation assay with a functional sensitivity of <=0.01 uIU/mL. Performed at [facility], [address], [place], [place] [address]   Lipid panel     Status: None   Collection Time: 05/03/23  6:07 PM  Result Value Ref Range   Cholesterol 150 0 - 200 mg/dL   Triglycerides 616 <073 mg/dL   HDL 47 >71 mg/dL   Total CHOL/HDL Ratio 3.2 RATIO   VLDL 29 0 - 40 mg/dL   LDL Cholesterol 74 0 - 99 mg/dL    Comment:        Total Cholesterol/HDL:CHD Risk Coronary Heart Disease Risk Table                     Men   Women  1/2 Average Risk  3.4   3.3  Average Risk       5.0   4.4  2 X Average Risk   9.6   7.1  3 X Average Risk  23.4   11.0        Use the calculated Patient Ratio above and the CHD Risk Table to determine the patient's CHD Risk.        ATP III CLASSIFICATION (LDL):  <100     mg/dL   Optimal  578-469  mg/dL   Near or Above                    Optimal  130-159   mg/dL   Borderline  629-528  mg/dL   High  >413     mg/dL   Very High Performed at [facility], [address], [place], [place] [address]   POCT Urine Drug Screen - (I-Screen)     Status: Abnormal   Collection Time: 05/03/23  6:19 PM  Result Value Ref Range   POC Amphetamine UR Positive (A) NONE DETECTED (Cut Off Level 1000 ng/mL)   POC Secobarbital (BAR) None Detected NONE DETECTED (Cut Off Level 300 ng/mL)   POC Buprenorphine (BUP) Positive (A) NONE DETECTED (Cut Off Level 10 ng/mL)   POC Oxazepam (BZO) Positive (A) NONE DETECTED (Cut Off Level 300 ng/mL)   POC Cocaine UR Positive (A) NONE DETECTED (Cut Off Level 300 ng/mL)   POC Methamphetamine UR Positive (A) NONE DETECTED (Cut Off Level 1000 ng/mL)   POC Morphine None Detected NONE DETECTED (Cut Off Level 300 ng/mL)   POC Methadone UR None Detected NONE DETECTED (Cut Off Level 300 ng/mL)   POC Oxycodone UR Positive (A) NONE DETECTED (Cut Off Level 100 ng/mL)   POC Marijuana UR Positive (A) NONE DETECTED (Cut Off Level 50 ng/mL)  POC urine preg, ED     Status: Normal   Collection Time: 05/03/23  6:19 PM  Result Value Ref Range   Preg Test, Ur Negative Negative    Blood Alcohol level:  Lab Results  Component Value Date   ETH <10 05/03/2023   ETH <10 12/07/2022    Metabolic Disorder Labs:  Lab Results  Component Value Date   HGBA1C 5.3 12/07/2022   MPG 105.41 12/07/2022   Lab Results  Component Value Date   PROLACTIN 11.5 12/07/2022   Lab Results  Component Value Date   CHOL 150 05/03/2023   TRIG 145 05/03/2023   HDL 47 05/03/2023   CHOLHDL 3.2 05/03/2023   VLDL 29 05/03/2023   LDLCALC 74 05/03/2023   LDLCALC 114 (H) 12/07/2022    Current Medications: Current Facility-Administered Medications  Medication Dose Route Frequency Provider Last Rate Last Admin   acetaminophen (TYLENOL) tablet 650 mg  650 mg Oral Q6H PRN Eligha Bridegroom, NP       alum & mag hydroxide-simeth (MAALOX/MYLANTA) 200-200-20  MG/5ML suspension 30 mL  30 mL Oral Q4H PRN Eligha Bridegroom, NP       buprenorphine-naloxone (SUBOXONE) 8-2 mg per SL tablet 1 tablet  1 tablet Sublingual TID Eligha Bridegroom, NP   1 tablet at 05/05/23 0272   haloperidol (HALDOL) tablet 5 mg  5 mg Oral TID PRN Eligha Bridegroom, NP       And   diphenhydrAMINE (BENADRYL) capsule 50 mg  50 mg Oral TID PRN Eligha Bridegroom, NP       haloperidol lactate (HALDOL) injection 5 mg  5 mg Intramuscular TID PRN Eligha Bridegroom,  NP       And   diphenhydrAMINE (BENADRYL) injection 50 mg  50 mg Intramuscular TID PRN Eligha Bridegroom, NP       And   LORazepam (ATIVAN) injection 2 mg  2 mg Intramuscular TID PRN Eligha Bridegroom, NP       hydrOXYzine (ATARAX) tablet 25 mg  25 mg Oral TID PRN Eligha Bridegroom, NP       magnesium hydroxide (MILK OF MAGNESIA) suspension 30 mL  30 mL Oral Daily PRN Eligha Bridegroom, NP       nicotine (NICODERM CQ - dosed in mg/24 hours) patch 14 mg  14 mg Transdermal Daily Sindy Guadeloupe, NP   14 mg at 05/05/23 0454   nitrofurantoin (macrocrystal-monohydrate) (MACROBID) capsule 100 mg  100 mg Oral Q12H Kelce Bouton, Rulon Eisenmenger, MD   100 mg at 05/05/23 0821   [START ON 05/06/2023] PARoxetine (PAXIL) tablet 20 mg  20 mg Oral Daily Rex Kras, MD       traZODone (DESYREL) tablet 50 mg  50 mg Oral QHS PRN Eligha Bridegroom, NP   50 mg at 05/04/23 2019   PTA Medications: Medications Prior to Admission  Medication Sig Dispense Refill Last Dose   FLUoxetine (PROZAC) 20 MG capsule Take 40 mg by mouth daily.      nitrofurantoin, macrocrystal-monohydrate, (MACROBID) 100 MG capsule Take 100 mg by mouth 2 (two) times daily.      SUBOXONE 8-2 MG FILM Place 1 Film under the tongue in the morning, at noon, and at bedtime.       Musculoskeletal: Strength & Muscle Tone: within normal limits Gait & Station: normal Patient leans: N/A            Psychiatric Specialty Exam:  Presentation  General Appearance:  Casual;  Disheveled  Eye Contact: Fair  Speech: Clear and Coherent  Speech Volume: Normal  Handedness: Right   Mood and Affect  Mood: Anxious; Depressed  Affect: Restricted   Thought Process  Thought Processes: Coherent  Duration of Psychotic Symptoms:N/A Past Diagnosis of Schizophrenia or Psychoactive disorder: No  Descriptions of Associations:Intact  Orientation:Full (Time, Place and Person)  Thought Content:Rumination; Perseveration  Hallucinations:Hallucinations: None  Ideas of Reference:None  Suicidal Thoughts:Suicidal Thoughts: Yes, Passive  Homicidal Thoughts:Homicidal Thoughts: No   Sensorium  Memory: Immediate Fair; Remote Fair; Recent Fair  Judgment: Fair  Insight: Fair   Art therapist  Concentration: Fair  Attention Span: Fair  Recall: [employer] of Knowledge: Fair  Language: Fair   Psychomotor Activity  Psychomotor Activity: Psychomotor Activity: Normal   Assets  Assets: Communication Skills; Desire for Improvement   Sleep  Sleep: Sleep: Fair Number of Hours of Sleep: 7    Physical Exam: Physical Exam ROS Blood pressure 99/70, pulse (!) 55, temperature 98.2 F (36.8 C), temperature source Oral, resp. rate 17, height 5\' 1"  (1.549 m), weight 63 kg, SpO2 100%. Body mass index is 26.26 kg/m.  Treatment Plan Summary: Daily contact with patient to assess and evaluate symptoms and progress in treatment, Medication management, and Plan as noted below. ASSESSMENT:  Diagnoses / Active Problems: Major depression recurrent with suicidal ideations Opioid use disorder, on MAT program using Suboxone Substance use disorder  PLAN: Safety and Monitoring:  --  Voluntary admission to inpatient psychiatric unit for safety, stabilization and treatment  -- Daily contact with patient to assess and evaluate symptoms and progress in treatment  -- Patient's case to be discussed in multi-disciplinary team meeting  --  Observation Level : q15 minute checks  --  Vital signs:  q12 hours  -- Precautions: suicide, elopement, and assault  2. Psychiatric Diagnoses and Treatment:    --  The risks/benefits/side-effects/alternatives to this medication were discussed in detail with the patient and time was given for questions. The patient consents to medication trial.  -- Increase Paxil to 20 mg a day -- Suboxone 8-2 mg sublingual 1 tablet a day -Macrobid 100 mg twice a day  -- Metabolic profile and EKG monitoring obtained while on an atypical antipsychotic (BMI: Lipid Panel: HbgA1c: QTc:) as indicated  -- Encouraged patient to participate in unit milieu and in scheduled group therapies   -- Short Term Goals: Ability to identify changes in lifestyle to reduce recurrence of condition will improve, Ability to disclose and discuss suicidal ideas, Ability to demonstrate self-control will improve, and Compliance with prescribed medications will improve  -- Long Term Goals: Improvement in symptoms so as ready for discharge    3. Medical Issues Being Addressed:   Tobacco Use Disorder  -- Nicotine patch 21mg /24 hours ordered  -- Smoking cessation encouraged  4. Discharge Planning:   -- Social work and case management to assist with discharge planning and identification of hospital follow-up needs prior to discharge  -- Estimated LOS: 5-7 days  -- Discharge Concerns: Need to establish a safety plan; Medication compliance and effectiveness  -- Discharge Goals: Return home with outpatient referrals for mental health follow-up including medication management/psychotherapy   Physician Treatment Plan for Primary Diagnosis: MDD (major depressive disorder)  Physician Treatment Plan for Secondary Diagnosis: Principal Problem:   MDD (major depressive disorder)   I certify that inpatient services furnished can reasonably be expected to improve the patient's condition.    Rex Kras, MD 11/28/202411:18 AM Patient ID:  Hailey Perez, female   DOB: [birth year], 47 y.o.   MRN: [id number]

## 2023-05-05 NOTE — Group Note (Signed)
[facility] LCSW Group Therapy Note  Date/Time: 05/05/2023 at 11:00AM - 12:00PM  Type of Therapy/Topic:  Group Therapy:  Journey and Not the Destination  Participation Level:  Patient did not attend group on today. Patients are encouraged to participate in all programming on unit.   Fernande Boyden, LCSW Clinical Social Worker [facility]

## 2023-05-05 NOTE — Progress Notes (Signed)
     05/05/2023       11:35 AM   Stark Jock   Type of Note: [employer] with [place] in intake at [facility], pt's referral was reviewed and pt needs to call facility to complete pre-screener [phone]). Due to the holiday, pt will need to call either today or tomorrow between 9-3pm. Pt given this information and has been advised to call to complete this. [facility] will then call this CSW to discuss d/c date and confirm admission to program.   Pt agreeable and will call for screening today after lunch. CSW will continue to assist.  Signed:  Andriea Hasegawa, LCSW-A 05/05/2023  11:35 AM

## 2023-05-05 NOTE — Progress Notes (Signed)
   05/05/23 0006  Psych Admission Type (Psych Patients Only)  Admission Status Voluntary  Psychosocial Assessment  Patient Complaints Anxiety;Sleep disturbance  Eye Contact Fair  Facial Expression Anxious;Flat  Affect Anxious;Flat  Speech Logical/coherent;Soft;Slow  Interaction Guarded  Motor Activity Slow  Appearance/Hygiene Disheveled  Behavior Characteristics Cooperative;Anxious  Mood Anxious;Depressed  Thought Process  Coherency WDL  Content WDL  Delusions WDL  Perception WDL  Hallucination None reported or observed  Judgment Poor  Confusion WDL  Danger to Self  Current suicidal ideation? Denies  Danger to Others  Danger to Others None reported or observed   Upon initial assessment pt was laying in bed resting, states she was tired and wanted to get a good night sleep. Pt reports being anxious, denies SI/HI or hallucinations (a) 15 min checks (r) safety maintained.

## 2023-05-06 DIAGNOSIS — F332 Major depressive disorder, recurrent severe without psychotic features: Secondary | ICD-10-CM | POA: Diagnosis not present

## 2023-05-06 NOTE — Progress Notes (Signed)
LCSW followed up with [facility] to follow up regarding phone screening. Per Admissions, patient was still reporting SI on yesterday when they spoke with her. Facility is asking for patient to be more appropriately stabilized and for updated notes to be provided before a decision can be made. No other concerns were reported by facility at this time. Updates will be provided as received.   Fernande Boyden, LCSW Clinical Social Worker [facility]

## 2023-05-06 NOTE — Progress Notes (Signed)
[facility] MD Progress Note  05/06/2023 2:40 PM Hailey Perez  MRN:  [id number]  Principal Problem: MDD (major depressive disorder) Diagnosis: Principal Problem:   MDD (major depressive disorder)  Patient is a  47y.o. female who presents to the [facility] unit due to depression, suicidal ideations in context of recent relapse on drugs.   Interval History Patient was seen today for re-evaluation.  Nursing reports no events overnight. The patient has no issues with performing ADLs.  Patient has been medication compliant.    Patient was seen and interviewed by attending psychiatrist. Chart reviewed. Patient discussed during treatment team rounds.  Subjective:  On assessment patient reports "feeling good" and reports good mood, denies feeling depressed, anxious, reports good sleep and appetite. She denies thoughts or plans of hurting self or others. She denies any symptoms of opioid withdrawal on a current dose of Suboxone. No symptoms of psychosis - denies auditory or visual hallucinations, denies feeling paranoid, unsafe, does not express any delusions. She reports no side effects from medications she is getting here. She denies any physical complaints. She wants to go to [facility] upon discharge.   Labs: no new results for review.    Total Time spent with patient: 20 minutes  Past Psychiatric History: see H&P  Past Medical History:  Past Medical History:  Diagnosis Date   Substance abuse (HCC)     Past Surgical History:  Procedure Laterality Date   I & D EXTREMITY Right 12/12/2014   Procedure: IRRIGATION AND DEBRIDEMENT EXTREMITY;  Surgeon: Knute Neu, MD;  Location: [facility];  Service: Plastics;  Laterality: Right;   TUBAL LIGATION     Family History:  Family History  Problem Relation Age of Onset   Hypertension Mother    Family Psychiatric  History: see H&P Social History:  Social History   Substance and Sexual Activity  Alcohol Use No     Social History    Substance and Sexual Activity  Drug Use Yes   Types: IV, Benzodiazepines, Cocaine, Marijuana, Oxycodone, Methamphetamines   Comment: heroin    Social History   Socioeconomic History   Marital status: Single    Spouse name: Not on file   Number of children: Not on file   Years of education: Not on file   Highest education level: Not on file  Occupational History   Not on file  Tobacco Use   Smoking status: Every Day    Current packs/day: 0.50    Types: Cigarettes   Smokeless tobacco: Never  Vaping Use   Vaping status: Never Used  Substance and Sexual Activity   Alcohol use: No   Drug use: Yes    Types: IV, Benzodiazepines, Cocaine, Marijuana, Oxycodone, Methamphetamines    Comment: heroin   Sexual activity: Yes    Birth control/protection: Surgical  Other Topics Concern   Not on file  Social History Narrative   Not on file   Social Determinants of Health   Financial Resource Strain: Not on file  Food Insecurity: Food Insecurity Present (05/03/2023)   Hunger Vital Sign    Worried About Running Out of Food in the Last Year: Sometimes true    Ran Out of Food in the Last Year: Sometimes true  Transportation Needs: No Transportation Needs (05/03/2023)   PRAPARE - Administrator, Civil Service (Medical): No    Lack of Transportation (Non-Medical): No  Physical Activity: Not on file  Stress: Not on file  Social Connections: Not on file  Additional Social History:                         Sleep: Good  Appetite:  Good  Current Medications: Current Facility-Administered Medications  Medication Dose Route Frequency Provider Last Rate Last Admin   acetaminophen (TYLENOL) tablet 650 mg  650 mg Oral Q6H PRN Eligha Bridegroom, NP       alum & mag hydroxide-simeth (MAALOX/MYLANTA) 200-200-20 MG/5ML suspension 30 mL  30 mL Oral Q4H PRN Eligha Bridegroom, NP       buprenorphine-naloxone (SUBOXONE) 8-2 mg per SL tablet 1 tablet  1 tablet Sublingual TID  Eligha Bridegroom, NP   1 tablet at 05/06/23 1254   haloperidol (HALDOL) tablet 5 mg  5 mg Oral TID PRN Eligha Bridegroom, NP       And   diphenhydrAMINE (BENADRYL) capsule 50 mg  50 mg Oral TID PRN Eligha Bridegroom, NP       haloperidol lactate (HALDOL) injection 5 mg  5 mg Intramuscular TID PRN Eligha Bridegroom, NP       And   diphenhydrAMINE (BENADRYL) injection 50 mg  50 mg Intramuscular TID PRN Eligha Bridegroom, NP       And   LORazepam (ATIVAN) injection 2 mg  2 mg Intramuscular TID PRN Eligha Bridegroom, NP       hydrOXYzine (ATARAX) tablet 25 mg  25 mg Oral TID PRN Eligha Bridegroom, NP       magnesium hydroxide (MILK OF MAGNESIA) suspension 30 mL  30 mL Oral Daily PRN Eligha Bridegroom, NP       nicotine (NICODERM CQ - dosed in mg/24 hours) patch 14 mg  14 mg Transdermal Daily Sindy Guadeloupe, NP   14 mg at 05/06/23 1610   nitrofurantoin (macrocrystal-monohydrate) (MACROBID) capsule 100 mg  100 mg Oral Q12H Rex Kras, MD   100 mg at 05/06/23 9604   PARoxetine (PAXIL) tablet 20 mg  20 mg Oral Daily Rex Kras, MD   20 mg at 05/06/23 0807   traZODone (DESYREL) tablet 50 mg  50 mg Oral QHS PRN Eligha Bridegroom, NP   50 mg at 05/05/23 2130    Lab Results: No results found for this or any previous visit (from the past 48 hour(s)).  Blood Alcohol level:  Lab Results  Component Value Date   ETH <10 05/03/2023   ETH <10 12/07/2022    Metabolic Disorder Labs: Lab Results  Component Value Date   HGBA1C 5.3 12/07/2022   MPG 105.41 12/07/2022   Lab Results  Component Value Date   PROLACTIN 11.5 12/07/2022   Lab Results  Component Value Date   CHOL 150 05/03/2023   TRIG 145 05/03/2023   HDL 47 05/03/2023   CHOLHDL 3.2 05/03/2023   VLDL 29 05/03/2023   LDLCALC 74 05/03/2023   LDLCALC 114 (H) 12/07/2022    Physical Findings: AIMS:  , ,  ,  ,    CIWA:    COWS:     Musculoskeletal: Strength & Muscle Tone: within normal limits Gait & Station: normal Patient leans:  N/A  Psychiatric Specialty Exam:  Appearance:  CF, appearing stated age,  wearing appropriate to the situation casual/hospital clothes, with fair grooming and hygiene. Normal level of alertness and appropriate facial expression.  Attitude/Behavior: calm, cooperative, engaging with appropriate eye contact.  Motor: WNL; dyskinesias not evident. Gait appears in full range.  Speech: spontaneous, clear, coherent, normal comprehension.  Mood: euthymic, " okay ".  Affect: appropriately-reactive, restricted.  Thought process: patient appears coherent, organized, logical, goal-directed, associations are appropriate.  Thought content: patient denies suicidal thoughts, denies homicidal thoughts; did not express any delusions.  Thought perception: patient denies auditory and visual hallucinations. Did not appear internally stimulated.  Cognition: patient is alert and oriented in self, place, date; with intact attention and concentration.  Insight: fair, in regards of understanding of presence, nature, cause, and significance of mental or emotional problem.  Judgement: fair, in regards of ability to make good decisions concerning the appropriate thing to do in various situations, including ability to form opinions regarding their mental health condition.  Assets  Assets: Manufacturing systems engineer; Desire for Improvement   Sleep  Sleep: Sleep: Fair Number of Hours of Sleep: 7    Physical Exam: Physical Exam ROS Blood pressure 106/69, pulse (!) 57, temperature 98.1 F (36.7 C), temperature source Oral, resp. rate 18, height 5\' 1"  (1.549 m), weight 63 kg, SpO2 98%. Body mass index is 26.26 kg/m.   Treatment Plan Summary: Daily contact with patient to assess and evaluate symptoms and progress in treatment and Medication management  Patient is a 47 year old female with the above-stated past psychiatric history who is seen in follow-up.  Chart reviewed. Patient discussed with  nursing.  Diagnoses/ Active problems: Major depression recurrent with suicidal ideations Opioid use disorder, on MAT program using Suboxone Substance use disorder   PLAN:  Safety and Monitoring: continue inpatient psych admission; 15-minute checks; daily contact with patient to assess and evaluate symptoms and progress in treatment; psychoeducation.Vital signs: q12 hours. Precautions: suicide, elopement, and assault. Placed on room lock out for meals, snacks and groups.  Psychiatric Problems: Continue Paxil 20 mg a day for depression Continue Suboxone 8-2 mg sublingual TID for opioid use disorder  - Encouraged patient to participate in unit milieu and in scheduled group therapies   Medical Problems: UTI -continue Macrobid 100 mg twice a day   PRN medications: acetaminophen, alum & mag hydroxide-simeth, haloperidol **AND** diphenhydrAMINE, haloperidol lactate **AND** diphenhydrAMINE **AND** LORazepam, hydrOXYzine, magnesium hydroxide, traZODone   Pertinent Labs: no new labs ordered today  Consults: No new consults placed since yesterday    Discharge Planning: -Social work and case management to assist with discharge planning and identification of hospital follow-up needs prior to discharge -Estimated LOS: 3-4 days -Discharge Concerns: Need to establish a safety plan; Medication compliance and effectiveness -Discharge Goals: Return home with outpatient referrals for mental health follow-up including medication management/psychotherapy  Total Time Spent in Direct Patient Care:  I personally spent 35 minutes on the unit in direct patient care. The direct patient care time included face-to-face time with the patient, reviewing the patient's chart, communicating with other professionals, and coordinating care. Greater than 50% of this time was spent in counseling or coordinating care with the patient regarding goals of hospitalization, psycho-education, and discharge planning needs.    Thalia Party, MD 05/06/2023, 2:40 PM

## 2023-05-06 NOTE — Group Note (Signed)
Date:  05/06/2023 Time:  5:11 PM  Group Topic/Focus:  Healthy Communication:   The focus of this group is to discuss communication, barriers to communication, as well as healthy ways to communicate with others.    Participation Level:  None  Participation Quality:  Inattentive  Affect:  Depressed  Cognitive:  Oriented  Insight: None  Engagement in Group:  None  Modes of Intervention:  Education  Additional Comments:     Reymundo Poll 05/06/2023, 5:11 PM

## 2023-05-06 NOTE — Group Note (Signed)
Recreation Therapy Group Note   Group Topic:Problem Solving  Group Date: 05/06/2023 Start Time: 0930 End Time: 1000 Facilitators: Shaton Lore-McCall, LRT,CTRS Location: [address] Dayroom   Group Topic: Problem Solving  Goal Area(s) Addresses:  Patient will effectively work in a team with other group members. Patient will verbalize importance of using appropriate problem solving techniques.  Patient will identify positive change associated with effective problem solving skills.   Intervention: Worksheets, Pencils  Group Description: Dentist. Patients were given two sheets of brain teasers. Patients were given 20 minutes to try and figure out as many of teasers they could. Patients were also allowed to work together if they chose to. Once patients finished, LRT would go over the answers with the patients.    Education Outcome: Acknowledges understanding/In group clarification offered/Needs additional education.    Clinical Observations/Individualized Feedback: Due to previous group going over/exceeding time, recreation therapy group was unable to be held at scheduled time.     Plan: Continue to engage patient in RT group sessions 2-3x/week.   Nyxon Strupp-McCall, LRT,CTRS 05/06/2023 1:13 PM

## 2023-05-06 NOTE — Plan of Care (Signed)
  Problem: Activity: Goal: Sleeping patterns will improve Outcome: Progressing   Problem: Education: Goal: Emotional status will improve Outcome: Progressing Goal: Mental status will improve Outcome: Progressing   Problem: Activity: Goal: Interest or engagement in activities will improve Outcome: Progressing

## 2023-05-06 NOTE — Progress Notes (Signed)
   05/06/23 0903  Psych Admission Type (Psych Patients Only)  Admission Status Voluntary  Psychosocial Assessment  Patient Complaints Anxiety;Depression  Eye Contact Fair  Facial Expression Flat;Sad  Affect Flat;Depressed  Speech Logical/coherent;Soft  Interaction Guarded;Isolative  Motor Activity Slow  Appearance/Hygiene Unremarkable  Behavior Characteristics Cooperative;Appropriate to situation  Mood Anxious;Depressed  Thought Process  Coherency WDL  Content WDL  Delusions WDL  Perception WDL  Hallucination None reported or observed  Judgment Impaired  Confusion None  Danger to Self  Current suicidal ideation? Denies  Description of Suicide Plan No plan  Self-Injurious Behavior No self-injurious ideation or behavior indicators observed or expressed   Agreement Not to Harm Self Yes  Description of Agreement Verbal  Danger to Others  Danger to Others None reported or observed

## 2023-05-06 NOTE — Progress Notes (Signed)
   05/05/23 2016  Psych Admission Type (Psych Patients Only)  Admission Status Voluntary  Psychosocial Assessment  Patient Complaints Anxiety;Depression  Eye Contact Fair  Facial Expression Sad  Affect Flat;Depressed  Speech Logical/coherent;Soft  Interaction Guarded  Motor Activity Slow  Appearance/Hygiene Unremarkable  Behavior Characteristics Cooperative;Appropriate to situation  Mood Depressed;Anxious  Thought Process  Coherency WDL  Content WDL  Delusions WDL  Perception WDL  Hallucination None reported or observed  Judgment Impaired  Confusion None  Danger to Self  Current suicidal ideation? Denies  Agreement Not to Harm Self Yes  Description of Agreement verbal  Danger to Others  Danger to Others None reported or observed

## 2023-05-06 NOTE — [facility] Group Notes (Signed)
[facility] Group Notes:  (Nursing/MHT/Case Management/Adjunct)  Date:  05/06/2023  Time:  1:46 AM  Type of Therapy:   Wrap-up group  Participation Level:  Did Not Attend  Participation Quality:    Affect:    Cognitive:    Insight:    Engagement in Group:    Modes of Intervention:    Summary of Progress/Problems:  Hailey Perez 05/06/2023, 1:46 AM

## 2023-05-07 DIAGNOSIS — F332 Major depressive disorder, recurrent severe without psychotic features: Secondary | ICD-10-CM | POA: Diagnosis not present

## 2023-05-07 NOTE — Plan of Care (Signed)
  Problem: Self-Concept: Goal: Level of anxiety will decrease Outcome: Progressing   Problem: Safety: Goal: Periods of time without injury will increase Outcome: Progressing

## 2023-05-07 NOTE — Progress Notes (Signed)
   05/07/23 1000  Psych Admission Type (Psych Patients Only)  Admission Status Voluntary  Psychosocial Assessment  Patient Complaints Depression  Eye Contact Fair  Facial Expression Flat  Affect Depressed  Speech Logical/coherent  Interaction Isolative  Motor Activity Slow  Appearance/Hygiene Unremarkable  Behavior Characteristics Appropriate to situation  Mood Depressed  Thought Process  Coherency WDL  Content WDL  Delusions WDL  Perception WDL  Hallucination None reported or observed  Judgment Impaired  Confusion None  Danger to Self  Current suicidal ideation? Denies  Danger to Others  Danger to Others None reported or observed

## 2023-05-07 NOTE — Progress Notes (Signed)
   05/06/23 2026  Psych Admission Type (Psych Patients Only)  Admission Status Voluntary  Psychosocial Assessment  Patient Complaints Depression;Anxiety  Eye Contact Fair  Facial Expression Flat;Sad  Speech Logical/coherent  Interaction Guarded;Isolative  Motor Activity Slow  Appearance/Hygiene Unremarkable  Behavior Characteristics Cooperative;Appropriate to situation  Mood Anxious;Depressed  Aggressive Behavior  Effect No apparent injury  Thought Process  Coherency WDL  Content WDL  Delusions WDL  Perception WDL  Hallucination None reported or observed  Judgment Impaired  Confusion None  Danger to Self  Current suicidal ideation? Denies  Agreement Not to Harm Self Yes  Description of Agreement verbal  Danger to Others  Danger to Others None reported or observed

## 2023-05-07 NOTE — Group Note (Unsigned)
Date:  05/08/2023 Time:  12:24 AM  Group Topic/Focus:  Wrap-Up Group:   The focus of this group is to help patients review their daily goal of treatment and discuss progress on daily workbooks.    Participation Level:  Minimal  Participation Quality:  Appropriate and Sharing  Affect:  Appropriate  Cognitive:  Appropriate  Insight: Appropriate and Limited  Engagement in Group:  Engaged and Limited  Modes of Intervention:  Activity and Socialization  Additional Comments:  Patient stated that she is doing "good" and that she had a "good day". Patient stated that she slept most of the day. Patient was limited and did not share much during group. Patient did not want to participate in group activity. Patient rated her day a 8/10.  Kennieth Francois 05/08/2023, 12:24 AM

## 2023-05-07 NOTE — Progress Notes (Signed)
   05/07/23 2005  Psych Admission Type (Psych Patients Only)  Admission Status Voluntary  Psychosocial Assessment  Patient Complaints Depression  Eye Contact Fair  Facial Expression Flat  Affect Appropriate to circumstance;Depressed  Speech Logical/coherent  Interaction Guarded  Motor Activity Slow  Appearance/Hygiene Unremarkable  Behavior Characteristics Appropriate to situation;Cooperative  Mood Depressed;Pleasant  Thought Process  Coherency WDL  Content WDL  Delusions None reported or observed  Perception WDL  Hallucination None reported or observed  Judgment Poor  Confusion None  Danger to Self  Current suicidal ideation? Denies  Agreement Not to Harm Self Yes  Description of Agreement verbally

## 2023-05-07 NOTE — Progress Notes (Signed)
[facility] MD Progress Note  05/07/2023 8:21 AM OAKLEY LASEK  MRN:  [id number]  Principal Problem: MDD (major depressive disorder) Diagnosis: Principal Problem:   MDD (major depressive disorder)  Patient is a  47y.o. female who presents to the [facility] unit due to depression, suicidal ideations in context of recent relapse on drugs.   Interval History Patient was seen today for re-evaluation.  Nursing reports no events overnight. The patient has no issues with performing ADLs.  Patient has been medication compliant.    Patient was seen and interviewed by attending psychiatrist. Chart reviewed. Patient discussed during treatment team rounds.  Subjective:  On assessment patient reports "everything is good". Patient reports good mood, denies feeling depressed, anxious. She reports good sleep and denies issues with appetite. She denies thoughts or plans of hurting self or others. She denies any symptoms of opioid withdrawal on a current dose of Suboxone. No symptoms of psychosis - denies auditory or visual hallucinations, denies feeling paranoid, unsafe, does not express any delusions. She reports no side effects from medications she is getting here. She denies any physical complaints. She wants to go to [facility] treatment center upon discharge.   Labs: no new results for review.    Total Time spent with patient: 20 minutes  Past Psychiatric History: see H&P  Past Medical History:  Past Medical History:  Diagnosis Date   Substance abuse (HCC)     Past Surgical History:  Procedure Laterality Date   I & D EXTREMITY Right 12/12/2014   Procedure: IRRIGATION AND DEBRIDEMENT EXTREMITY;  Surgeon: Knute Neu, MD;  Location: [facility];  Service: Plastics;  Laterality: Right;   TUBAL LIGATION     Family History:  Family History  Problem Relation Age of Onset   Hypertension Mother    Family Psychiatric  History: see H&P Social History:  Social History   Substance and Sexual Activity  Alcohol  Use No     Social History   Substance and Sexual Activity  Drug Use Yes   Types: IV, Benzodiazepines, Cocaine, Marijuana, Oxycodone, Methamphetamines   Comment: heroin    Social History   Socioeconomic History   Marital status: Single    Spouse name: Not on file   Number of children: Not on file   Years of education: Not on file   Highest education level: Not on file  Occupational History   Not on file  Tobacco Use   Smoking status: Every Day    Current packs/day: 0.50    Types: Cigarettes   Smokeless tobacco: Never  Vaping Use   Vaping status: Never Used  Substance and Sexual Activity   Alcohol use: No   Drug use: Yes    Types: IV, Benzodiazepines, Cocaine, Marijuana, Oxycodone, Methamphetamines    Comment: heroin   Sexual activity: Yes    Birth control/protection: Surgical  Other Topics Concern   Not on file  Social History Narrative   Not on file   Social Determinants of Health   Financial Resource Strain: Not on file  Food Insecurity: Food Insecurity Present (05/03/2023)   Hunger Vital Sign    Worried About Running Out of Food in the Last Year: Sometimes true    Ran Out of Food in the Last Year: Sometimes true  Transportation Needs: No Transportation Needs (05/03/2023)   PRAPARE - Administrator, Civil Service (Medical): No    Lack of Transportation (Non-Medical): No  Physical Activity: Not on file  Stress: Not on file  Social  Connections: Not on file   Additional Social History:                         Sleep: Good  Appetite:  Good  Current Medications: Current Facility-Administered Medications  Medication Dose Route Frequency Provider Last Rate Last Admin   acetaminophen (TYLENOL) tablet 650 mg  650 mg Oral Q6H PRN Eligha Bridegroom, NP       alum & mag hydroxide-simeth (MAALOX/MYLANTA) 200-200-20 MG/5ML suspension 30 mL  30 mL Oral Q4H PRN Eligha Bridegroom, NP       buprenorphine-naloxone (SUBOXONE) 8-2 mg per SL tablet 1  tablet  1 tablet Sublingual TID Eligha Bridegroom, NP   1 tablet at 05/07/23 0801   haloperidol (HALDOL) tablet 5 mg  5 mg Oral TID PRN Eligha Bridegroom, NP       And   diphenhydrAMINE (BENADRYL) capsule 50 mg  50 mg Oral TID PRN Eligha Bridegroom, NP       haloperidol lactate (HALDOL) injection 5 mg  5 mg Intramuscular TID PRN Eligha Bridegroom, NP       And   diphenhydrAMINE (BENADRYL) injection 50 mg  50 mg Intramuscular TID PRN Eligha Bridegroom, NP       And   LORazepam (ATIVAN) injection 2 mg  2 mg Intramuscular TID PRN Eligha Bridegroom, NP       hydrOXYzine (ATARAX) tablet 25 mg  25 mg Oral TID PRN Eligha Bridegroom, NP       magnesium hydroxide (MILK OF MAGNESIA) suspension 30 mL  30 mL Oral Daily PRN Eligha Bridegroom, NP       nicotine (NICODERM CQ - dosed in mg/24 hours) patch 14 mg  14 mg Transdermal Daily Sindy Guadeloupe, NP   14 mg at 05/07/23 0801   nitrofurantoin (macrocrystal-monohydrate) (MACROBID) capsule 100 mg  100 mg Oral Q12H Rex Kras, MD   100 mg at 05/07/23 0801   PARoxetine (PAXIL) tablet 20 mg  20 mg Oral Daily Rex Kras, MD   20 mg at 05/07/23 0801   traZODone (DESYREL) tablet 50 mg  50 mg Oral QHS PRN Eligha Bridegroom, NP   50 mg at 05/05/23 2130    Lab Results: No results found for this or any previous visit (from the past 48 hour(s)).  Blood Alcohol level:  Lab Results  Component Value Date   ETH <10 05/03/2023   ETH <10 12/07/2022    Metabolic Disorder Labs: Lab Results  Component Value Date   HGBA1C 5.3 12/07/2022   MPG 105.41 12/07/2022   Lab Results  Component Value Date   PROLACTIN 11.5 12/07/2022   Lab Results  Component Value Date   CHOL 150 05/03/2023   TRIG 145 05/03/2023   HDL 47 05/03/2023   CHOLHDL 3.2 05/03/2023   VLDL 29 05/03/2023   LDLCALC 74 05/03/2023   LDLCALC 114 (H) 12/07/2022    Physical Findings: AIMS:  , ,  ,  ,    CIWA:    COWS:     Musculoskeletal: Strength & Muscle Tone: within normal limits Gait &  Station: normal Patient leans: N/A  Psychiatric Specialty Exam:  Appearance:  CF, appearing stated age,  wearing appropriate to the situation casual/hospital clothes, with fair grooming and hygiene. Normal level of alertness and appropriate facial expression.  Attitude/Behavior: calm, cooperative, engaging with appropriate eye contact.  Motor: WNL; dyskinesias not evident. Gait appears in full range.  Speech: spontaneous, clear, coherent, normal comprehension.  Mood: euthymic, "good".  Affect: appropriately-reactive, restricted.  Thought process: patient appears coherent, organized, logical, goal-directed, associations are appropriate.  Thought content: patient denies suicidal thoughts, denies homicidal thoughts; did not express any delusions.  Thought perception: patient denies auditory and visual hallucinations. Did not appear internally stimulated.  Cognition: patient is alert and oriented in self, place, date; with intact attention and concentration.  Insight: fair, in regards of understanding of presence, nature, cause, and significance of mental or emotional problem.  Judgement: fair, in regards of ability to make good decisions concerning the appropriate thing to do in various situations, including ability to form opinions regarding their mental health condition.  Assets  Assets: Manufacturing systems engineer; Desire for Improvement   Sleep  Sleep: No data recorded    Physical Exam: Physical Exam ROS Blood pressure 95/74, pulse (!) 56, temperature 98.1 F (36.7 C), temperature source Oral, resp. rate 18, height 5\' 1"  (1.549 m), weight 63 kg, SpO2 100%. Body mass index is 26.26 kg/m.   Treatment Plan Summary: Daily contact with patient to assess and evaluate symptoms and progress in treatment and Medication management  Patient is a 47 year old female with the above-stated past psychiatric history who is seen in follow-up.  Chart reviewed. Patient discussed with  nursing.  Diagnoses/ Active problems: Major depression recurrent with suicidal ideations Opioid use disorder, on MAT program using Suboxone Substance use disorder   PLAN:  Safety and Monitoring: continue inpatient psych admission; 15-minute checks; daily contact with patient to assess and evaluate symptoms and progress in treatment; psychoeducation.Vital signs: q12 hours. Precautions: suicide, elopement, and assault. Placed on room lock out for meals, snacks and groups.  Psychiatric Problems: Continue Paxil 20 mg a day for depression Continue Suboxone 8-2 mg sublingual TID for opioid use disorder  - Encouraged patient to participate in unit milieu and in scheduled group therapies   Medical Problems: UTI -continue Macrobid 100 mg twice a day   PRN medications: acetaminophen, alum & mag hydroxide-simeth, haloperidol **AND** diphenhydrAMINE, haloperidol lactate **AND** diphenhydrAMINE **AND** LORazepam, hydrOXYzine, magnesium hydroxide, traZODone   Pertinent Labs: no new labs ordered today  Consults: No new consults placed since yesterday    Discharge Planning: -Social work and case management to assist with discharge planning and identification of hospital follow-up needs prior to discharge -Estimated LOS: 3-4 days -Discharge Concerns: Need to establish a safety plan; Medication compliance and effectiveness -Discharge Goals: Return home with outpatient referrals for mental health follow-up including medication management/psychotherapy  Total Time Spent in Direct Patient Care:  I personally spent 35 minutes on the unit in direct patient care. The direct patient care time included face-to-face time with the patient, reviewing the patient's chart, communicating with other professionals, and coordinating care. Greater than 50% of this time was spent in counseling or coordinating care with the patient regarding goals of hospitalization, psycho-education, and discharge planning needs.    Thalia Party, MD 05/07/2023, 8:21 AM

## 2023-05-07 NOTE — Plan of Care (Signed)
  Problem: Education: Goal: Emotional status will improve Outcome: Not Progressing Goal: Mental status will improve Outcome: Not Progressing   

## 2023-05-07 NOTE — Group Note (Signed)
LCSW Group Therapy Note  Group Date: 05/07/2023 Start Time: 1000 End Time: 1100   Type of Therapy and Topic:  Group Therapy - Healthy vs Unhealthy Coping Skills  Participation Level:  Did Not Attend   Description of Group The focus of this group was to determine what unhealthy coping techniques typically are used by group members and what healthy coping techniques would be helpful in coping with various problems. Patients were guided in becoming aware of the differences between healthy and unhealthy coping techniques. Patients were asked to identify 2-3 healthy coping skills they would like to learn to use more effectively.  Therapeutic Goals Patients learned that coping is what human beings do all day long to deal with various situations in their lives Patients defined and discussed healthy vs unhealthy coping techniques Patients identified their preferred coping techniques and identified whether these were healthy or unhealthy Patients determined 2-3 healthy coping skills they would like to become more familiar with and use more often. Patients provided support and ideas to each other   Summary of Patient Progress: Patient was invited to group, did not attend.    Therapeutic Modalities Cognitive Behavioral Therapy Motivational Interviewing  Lynnell Chad, Theresia Majors 05/07/2023  11:12 AM

## 2023-05-07 NOTE — [facility] Group Notes (Signed)
[facility] Group Notes:  (Nursing/MHT/Case Management/Adjunct)  Date:  05/07/2023  Time:  2:37 PM  Type of Therapy:  Psychoeducational Skills  Participation Level:  Active  Participation Quality:  Appropriate  Affect:  Appropriate  Cognitive:  Alert and Appropriate  Insight:  Appropriate  Engagement in Group:  Engaged  Modes of Intervention:  Discussion, Education, and Exploration  Summary of Progress/Problems: Pts were educated on the impact of negative thinking, positive reframing and the power of mindfulness. Pts were allowed to discuss one negative thought/habit or coping skill they would like to change to impact their mental health. Pt attended and was appropriate.   Hailey Perez 05/07/2023, 2:37 PM

## 2023-05-08 DIAGNOSIS — F332 Major depressive disorder, recurrent severe without psychotic features: Secondary | ICD-10-CM

## 2023-05-08 NOTE — [facility] Group Notes (Signed)
[facility] Group Notes:  (Nursing/MHT/Case Management/Adjunct)  Date:  05/08/2023  Time:  12:58 PM  Type of Therapy:  Psychoeducational Skills  Participation Level:  Active  Participation Quality:  Appropriate  Affect:  Appropriate  Cognitive:  Alert and Appropriate  Insight:  Appropriate  Engagement in Group:  Engaged  Modes of Intervention:  Discussion and Education  Summary of Progress/Problems:  Patients were given education on motivation with a podcast from '' [employer] '' in which the guest was DR. ALOK K who talked about dopamine, and tapping into the power you have to get where you want to go in life. ''  Pt attended and was appropriate.   Malva Limes 05/08/2023, 12:58 PM

## 2023-05-08 NOTE — [facility] Counselor (Signed)
CSW spoke to the patient regarding her admission status to a treatment center in [place]. Pt stated she had completed the intake on Friday vis phone and that the center is going to call the social worker on Monday. Patient did not have any further information on status of admission or transportation arrangements.   Rachell Druckenmiller LCSWA

## 2023-05-08 NOTE — Progress Notes (Signed)
Pt did not attend goals group. 

## 2023-05-08 NOTE — Plan of Care (Signed)
Nurse discussed anxiety, depression and coping skills with patient.  

## 2023-05-08 NOTE — Plan of Care (Addendum)
D:  Patient denied SI and HI, contracts for safety.  Denied A/V hallucinations.   A:  Medications administered per MD orders.  Emotional support and encouragement given patient. R:  Safety maintained with 15 minute checks.  

## 2023-05-08 NOTE — Plan of Care (Signed)
  Problem: Education: Goal: Emotional status will improve Outcome: Progressing Goal: Mental status will improve Outcome: Progressing   

## 2023-05-08 NOTE — Progress Notes (Signed)
[facility] MD Progress Note  05/08/2023 8:22 AM KEVINA CERAR  MRN:  [id number]  Principal Problem: MDD (major depressive disorder) Diagnosis: Principal Problem:   MDD (major depressive disorder)  Patient is a  47y.o. female who presents to the [facility] unit due to depression, suicidal ideations in context of recent relapse on drugs.   Interval History Patient was seen today for re-evaluation.  Nursing reports no events overnight. The patient has no issues with performing ADLs.  Patient has been medication compliant.    Patient was seen and interviewed by attending psychiatrist. Chart reviewed. Patient discussed during treatment team rounds.  Subjective:  On assessment patient reports "I am fine, no complaints". Patient denies feeling depressed, anxious. She reports good sleep and denies issues with appetite. She denies thoughts or plans of hurting self or others. She denies any symptoms of opioid withdrawal on a current dose of Suboxone. She denies auditory or visual hallucinations, denies feeling paranoid, unsafe, does not express any delusions. She reports no side effects from medications. She denies any physical complaints. She still wants to go to [place] treatment center upon discharge - SW is working on that.   Labs: no new results for review.    Total Time spent with patient: 20 minutes  Past Psychiatric History: see H&P  Past Medical History:  Past Medical History:  Diagnosis Date   Substance abuse (HCC)     Past Surgical History:  Procedure Laterality Date   I & D EXTREMITY Right 12/12/2014   Procedure: IRRIGATION AND DEBRIDEMENT EXTREMITY;  Surgeon: Knute Neu, MD;  Location: [facility];  Service: Plastics;  Laterality: Right;   TUBAL LIGATION     Family History:  Family History  Problem Relation Age of Onset   Hypertension Mother    Family Psychiatric  History: see H&P Social History:  Social History   Substance and Sexual Activity  Alcohol Use No     Social History    Substance and Sexual Activity  Drug Use Yes   Types: IV, Benzodiazepines, Cocaine, Marijuana, Oxycodone, Methamphetamines   Comment: heroin    Social History   Socioeconomic History   Marital status: Single    Spouse name: Not on file   Number of children: Not on file   Years of education: Not on file   Highest education level: Not on file  Occupational History   Not on file  Tobacco Use   Smoking status: Every Day    Current packs/day: 0.50    Types: Cigarettes   Smokeless tobacco: Never  Vaping Use   Vaping status: Never Used  Substance and Sexual Activity   Alcohol use: No   Drug use: Yes    Types: IV, Benzodiazepines, Cocaine, Marijuana, Oxycodone, Methamphetamines    Comment: heroin   Sexual activity: Yes    Birth control/protection: Surgical  Other Topics Concern   Not on file  Social History Narrative   Not on file   Social Determinants of Health   Financial Resource Strain: Not on file  Food Insecurity: Food Insecurity Present (05/03/2023)   Hunger Vital Sign    Worried About Running Out of Food in the Last Year: Sometimes true    Ran Out of Food in the Last Year: Sometimes true  Transportation Needs: No Transportation Needs (05/03/2023)   PRAPARE - Administrator, Civil Service (Medical): No    Lack of Transportation (Non-Medical): No  Physical Activity: Not on file  Stress: Not on file  Social Connections: Not  on file   Additional Social History:                         Sleep: Good  Appetite:  Good  Current Medications: Current Facility-Administered Medications  Medication Dose Route Frequency Provider Last Rate Last Admin   acetaminophen (TYLENOL) tablet 650 mg  650 mg Oral Q6H PRN Eligha Bridegroom, NP       alum & mag hydroxide-simeth (MAALOX/MYLANTA) 200-200-20 MG/5ML suspension 30 mL  30 mL Oral Q4H PRN Eligha Bridegroom, NP       buprenorphine-naloxone (SUBOXONE) 8-2 mg per SL tablet 1 tablet  1 tablet Sublingual TID  Eligha Bridegroom, NP   1 tablet at 05/07/23 1722   haloperidol (HALDOL) tablet 5 mg  5 mg Oral TID PRN Eligha Bridegroom, NP       And   diphenhydrAMINE (BENADRYL) capsule 50 mg  50 mg Oral TID PRN Eligha Bridegroom, NP       haloperidol lactate (HALDOL) injection 5 mg  5 mg Intramuscular TID PRN Eligha Bridegroom, NP       And   diphenhydrAMINE (BENADRYL) injection 50 mg  50 mg Intramuscular TID PRN Eligha Bridegroom, NP       And   LORazepam (ATIVAN) injection 2 mg  2 mg Intramuscular TID PRN Eligha Bridegroom, NP       hydrOXYzine (ATARAX) tablet 25 mg  25 mg Oral TID PRN Eligha Bridegroom, NP       magnesium hydroxide (MILK OF MAGNESIA) suspension 30 mL  30 mL Oral Daily PRN Eligha Bridegroom, NP       nicotine (NICODERM CQ - dosed in mg/24 hours) patch 14 mg  14 mg Transdermal Daily Sindy Guadeloupe, NP   14 mg at 05/07/23 0801   nitrofurantoin (macrocrystal-monohydrate) (MACROBID) capsule 100 mg  100 mg Oral Q12H Rex Kras, MD   100 mg at 05/07/23 2001   PARoxetine (PAXIL) tablet 20 mg  20 mg Oral Daily Rex Kras, MD   20 mg at 05/07/23 0801   traZODone (DESYREL) tablet 50 mg  50 mg Oral QHS PRN Eligha Bridegroom, NP   50 mg at 05/07/23 2119    Lab Results: No results found for this or any previous visit (from the past 48 hour(s)).  Blood Alcohol level:  Lab Results  Component Value Date   ETH <10 05/03/2023   ETH <10 12/07/2022    Metabolic Disorder Labs: Lab Results  Component Value Date   HGBA1C 5.3 12/07/2022   MPG 105.41 12/07/2022   Lab Results  Component Value Date   PROLACTIN 11.5 12/07/2022   Lab Results  Component Value Date   CHOL 150 05/03/2023   TRIG 145 05/03/2023   HDL 47 05/03/2023   CHOLHDL 3.2 05/03/2023   VLDL 29 05/03/2023   LDLCALC 74 05/03/2023   LDLCALC 114 (H) 12/07/2022    Physical Findings: AIMS:  , ,  ,  ,    CIWA:    COWS:     Musculoskeletal: Strength & Muscle Tone: within normal limits Gait & Station: normal Patient leans:  N/A  Psychiatric Specialty Exam:  Appearance:  CF, appearing stated age,  wearing appropriate to the situation casual/hospital clothes, with fair grooming and hygiene. Normal level of alertness and appropriate facial expression.  Attitude/Behavior: calm, cooperative, engaging with appropriate eye contact.  Motor: WNL; dyskinesias not evident. Gait appears in full range.  Speech: spontaneous, clear, coherent, normal comprehension.  Mood: euthymic, "good".  Affect: appropriately-reactive,  restricted.  Thought process: patient appears coherent, organized, logical, goal-directed, associations are appropriate.  Thought content: patient denies suicidal thoughts, denies homicidal thoughts; did not express any delusions.  Thought perception: patient denies auditory and visual hallucinations. Did not appear internally stimulated.  Cognition: patient is alert and oriented in self, place, date; with intact attention and concentration.  Insight: fair, in regards of understanding of presence, nature, cause, and significance of mental or emotional problem.  Judgement: fair, in regards of ability to make good decisions concerning the appropriate thing to do in various situations, including ability to form opinions regarding their mental health condition.  Assets  Assets: Manufacturing systems engineer; Desire for Improvement   Sleep  Sleep: No data recorded    Physical Exam: Physical Exam ROS Blood pressure 97/73, pulse 63, temperature 98.2 F (36.8 C), temperature source Oral, resp. rate 18, height 5\' 1"  (1.549 m), weight 63 kg, SpO2 100%. Body mass index is 26.26 kg/m.   Treatment Plan Summary: Daily contact with patient to assess and evaluate symptoms and progress in treatment and Medication management  Patient is a 47 year old female with the above-stated past psychiatric history who is seen in follow-up.  Chart reviewed. Patient discussed with nursing. Patient remains stable. No  medication changes made today.   Diagnoses/ Active problems: Major depression recurrent with suicidal ideations Opioid use disorder, on MAT program using Suboxone Substance use disorder   PLAN:  Safety and Monitoring: continue inpatient psych admission; 15-minute checks; daily contact with patient to assess and evaluate symptoms and progress in treatment; psychoeducation.Vital signs: q12 hours. Precautions: suicide, elopement, and assault. Placed on room lock out for meals, snacks and groups.  Psychiatric Problems: Continue Paxil 20 mg a day for depression Continue Suboxone 8-2 mg sublingual TID for opioid use disorder  - Encouraged patient to participate in unit milieu and in scheduled group therapies   Medical Problems: UTI -continue Macrobid 100 mg twice a day   PRN medications: acetaminophen, alum & mag hydroxide-simeth, haloperidol **AND** diphenhydrAMINE, haloperidol lactate **AND** diphenhydrAMINE **AND** LORazepam, hydrOXYzine, magnesium hydroxide, traZODone   Pertinent Labs: no new labs ordered today  Consults: No new consults placed since yesterday    Discharge Planning: -Social work and case management to assist with discharge planning and identification of hospital follow-up needs prior to discharge -Estimated LOS: 2-3 days -Discharge Concerns: Need to establish a safety plan; Medication compliance and effectiveness -Discharge Goals: Return home with outpatient referrals for mental health follow-up including medication management/psychotherapy  Total Time Spent in Direct Patient Care:  I personally spent 35 minutes on the unit in direct patient care. The direct patient care time included face-to-face time with the patient, reviewing the patient's chart, communicating with other professionals, and coordinating care. Greater than 50% of this time was spent in counseling or coordinating care with the patient regarding goals of hospitalization, psycho-education, and  discharge planning needs.   Thalia Party, MD 05/08/2023, 8:22 AM

## 2023-05-08 NOTE — Progress Notes (Signed)
   05/08/23 2023  Psych Admission Type (Psych Patients Only)  Admission Status Voluntary  Psychosocial Assessment  Patient Complaints Depression  Eye Contact Fair  Facial Expression Flat  Affect Appropriate to circumstance;Depressed  Speech Logical/coherent  Interaction Guarded  Motor Activity Slow  Appearance/Hygiene Unremarkable  Behavior Characteristics Cooperative;Appropriate to situation  Mood Depressed;Pleasant  Thought Process  Coherency WDL  Content WDL  Delusions None reported or observed  Perception WDL  Hallucination None reported or observed  Judgment Poor  Confusion None  Danger to Self  Current suicidal ideation? Denies  Agreement Not to Harm Self Yes  Description of Agreement verbally

## 2023-05-08 NOTE — [facility] Group Notes (Signed)
[facility] Group Notes:  (Nursing/MHT/Case Management/Adjunct)  Date:  05/08/2023  Time:  9:32 PM  Type of Therapy:  Psychoeducational Skills  Participation Level:  Active  Participation Quality:  Attentive  Affect:  Flat  Cognitive:  Appropriate  Insight:  Limited  Engagement in Group:  Developing/Improving  Modes of Intervention:  Education  Summary of Progress/Problems: Patient rated her day as an 8 out of 10. She explained to the group that she was out of her bedroom more today and that she "listened" to her peers.   Hazle Coca S 05/08/2023, 9:32 PM

## 2023-05-09 ENCOUNTER — Encounter (HOSPITAL_COMMUNITY): Payer: Self-pay

## 2023-05-09 MED ORDER — HALOPERIDOL 5 MG PO TABS: 5.0000 mg | ORAL_TABLET | Freq: Four times a day (QID) | ORAL | Status: DC | PRN

## 2023-05-09 MED ORDER — VENLAFAXINE HCL ER 37.5 MG PO CP24
37.5000 mg | ORAL_CAPSULE | Freq: Every day | ORAL | Status: DC
  Administered 2023-05-09 – 2023-05-10 (×2): 37.5 mg via ORAL
  Filled 2023-05-09 (×4): qty 1

## 2023-05-09 MED ORDER — ACETAMINOPHEN 325 MG PO TABS
650.0000 mg | ORAL_TABLET | Freq: Four times a day (QID) | ORAL | Status: DC | PRN
  Administered 2023-05-10: 650 mg via ORAL
  Filled 2023-05-09: qty 2

## 2023-05-09 MED ORDER — DIPHENHYDRAMINE HCL 25 MG PO CAPS: 25.0000 mg | ORAL_CAPSULE | Freq: Four times a day (QID) | ORAL | Status: DC | PRN

## 2023-05-09 MED ORDER — LORAZEPAM 1 MG PO TABS: 1.0000 mg | ORAL_TABLET | Freq: Four times a day (QID) | ORAL | Status: DC | PRN

## 2023-05-09 MED ORDER — DIPHENHYDRAMINE HCL 50 MG/ML IJ SOLN: 25.0000 mg | Freq: Four times a day (QID) | INTRAMUSCULAR | Status: DC | PRN

## 2023-05-09 MED ORDER — ONDANSETRON HCL 4 MG PO TABS: 8.0000 mg | ORAL_TABLET | Freq: Three times a day (TID) | ORAL | Status: DC | PRN

## 2023-05-09 MED ORDER — HALOPERIDOL LACTATE 5 MG/ML IJ SOLN: 5.0000 mg | Freq: Four times a day (QID) | INTRAMUSCULAR | Status: DC | PRN

## 2023-05-09 MED ORDER — HYDROXYZINE HCL 25 MG PO TABS: 25.0000 mg | ORAL_TABLET | Freq: Three times a day (TID) | ORAL | Status: DC | PRN

## 2023-05-09 MED ORDER — LORAZEPAM 2 MG/ML IJ SOLN: 1.0000 mg | Freq: Four times a day (QID) | INTRAMUSCULAR | Status: DC | PRN

## 2023-05-09 MED ORDER — POLYETHYLENE GLYCOL 3350 17 G PO PACK: 17.0000 g | PACK | Freq: Every day | ORAL | Status: DC | PRN

## 2023-05-09 MED ORDER — SENNA 8.6 MG PO TABS: 1.0000 | ORAL_TABLET | Freq: Every evening | ORAL | Status: DC | PRN

## 2023-05-09 MED ORDER — ALUM & MAG HYDROXIDE-SIMETH 200-200-20 MG/5ML PO SUSP: 30.0000 mL | ORAL | Status: DC | PRN

## 2023-05-09 MED ORDER — BISMUTH SUBSALICYLATE 262 MG PO CHEW: 524.0000 mg | CHEWABLE_TABLET | ORAL | Status: DC | PRN

## 2023-05-09 MED ORDER — NICOTINE POLACRILEX 2 MG MT GUM: 2.0000 mg | CHEWING_GUM | OROMUCOSAL | Status: DC | PRN

## 2023-05-09 NOTE — Progress Notes (Signed)
   05/09/23 2045  Psych Admission Type (Psych Patients Only)  Admission Status Voluntary  Psychosocial Assessment  Patient Complaints Depression;Substance abuse  Eye Contact Fair  Facial Expression Flat  Affect Appropriate to circumstance  Speech Logical/coherent  Interaction Assertive  Motor Activity Slow  Appearance/Hygiene Unremarkable  Behavior Characteristics Cooperative  Mood Pleasant  Aggressive Behavior  Effect No apparent injury  Thought Process  Coherency WDL  Content WDL  Delusions None reported or observed  Perception WDL  Hallucination None reported or observed  Judgment WDL  Confusion None  Danger to Self  Current suicidal ideation? Denies

## 2023-05-09 NOTE — BH IP Treatment Plan (Signed)
Interdisciplinary Treatment and Diagnostic Plan Update  05/09/2023 Time of Session: 12:00 UPDATE JANAYA TRIOLA MRN: [id number]  Principal Diagnosis: MDD (major depressive disorder)  Secondary Diagnoses: Principal Problem:   MDD (major depressive disorder)   Current Medications:  Current Facility-Administered Medications  Medication Dose Route Frequency Provider Last Rate Last Admin   acetaminophen (TYLENOL) tablet 650 mg  650 mg Oral Q6H PRN Augusto Gamble, MD       alum & mag hydroxide-simeth (MAALOX/MYLANTA) 200-200-20 MG/5ML suspension 30 mL  30 mL Oral Q4H PRN Augusto Gamble, MD       bismuth subsalicylate (PEPTO BISMOL) chewable tablet 524 mg  524 mg Oral Q3H PRN Augusto Gamble, MD       buprenorphine-naloxone (SUBOXONE) 8-2 mg per SL tablet 1 tablet  1 tablet Sublingual TID Eligha Bridegroom, NP   1 tablet at 05/09/23 1245   haloperidol (HALDOL) tablet 5 mg  5 mg Oral Q6H PRN Augusto Gamble, MD       And   LORazepam (ATIVAN) tablet 1 mg  1 mg Oral Q6H PRN Augusto Gamble, MD       And   diphenhydrAMINE (BENADRYL) capsule 25 mg  25 mg Oral Q6H PRN Augusto Gamble, MD       haloperidol lactate (HALDOL) injection 5 mg  5 mg Intramuscular Q6H PRN Augusto Gamble, MD       And   LORazepam (ATIVAN) injection 1 mg  1 mg Intramuscular Q6H PRN Augusto Gamble, MD       And   diphenhydrAMINE (BENADRYL) injection 25 mg  25 mg Intramuscular Q6H PRN Augusto Gamble, MD       hydrOXYzine (ATARAX) tablet 25 mg  25 mg Oral TID PRN Augusto Gamble, MD       nicotine (NICODERM CQ - dosed in mg/24 hours) patch 14 mg  14 mg Transdermal Daily Sindy Guadeloupe, NP   14 mg at 05/09/23 4098   nicotine polacrilex (NICORETTE) gum 2 mg  2 mg Oral PRN Augusto Gamble, MD       nitrofurantoin (macrocrystal-monohydrate) (MACROBID) capsule 100 mg  100 mg Oral Q12H Rex Kras, MD   100 mg at 05/09/23 0814   ondansetron (ZOFRAN) tablet 8 mg  8 mg Oral Q8H PRN Augusto Gamble, MD       polyethylene glycol (MIRALAX / GLYCOLAX) packet 17 g  17 g Oral  Daily PRN Augusto Gamble, MD       senna (SENOKOT) tablet 8.6 mg  1 tablet Oral QHS PRN Augusto Gamble, MD       traZODone (DESYREL) tablet 50 mg  50 mg Oral QHS PRN Eligha Bridegroom, NP   50 mg at 05/07/23 2119   venlafaxine XR (EFFEXOR-XR) 24 hr capsule 37.5 mg  37.5 mg Oral Q breakfast Augusto Gamble, MD   37.5 mg at 05/09/23 1245   PTA Medications: Medications Prior to Admission  Medication Sig Dispense Refill Last Dose   FLUoxetine (PROZAC) 20 MG capsule Take 40 mg by mouth daily.      nitrofurantoin, macrocrystal-monohydrate, (MACROBID) 100 MG capsule Take 100 mg by mouth 2 (two) times daily.      SUBOXONE 8-2 MG FILM Place 1 Film under the tongue in the morning, at noon, and at bedtime.       Patient Stressors: Neurosurgeon issue   Occupational concerns   Substance abuse    Patient Strengths: Active sense of humor  Average or above average intelligence  Motivation for treatment/growth   Treatment  Modalities: Medication Management, Group therapy, Case management,  1 to 1 session with clinician, Psychoeducation, Recreational therapy.   Physician Treatment Plan for Primary Diagnosis: MDD (major depressive disorder) Long Term Goal(s): Improvement in symptoms so as ready for discharge   Short Term Goals: Ability to identify changes in lifestyle to reduce recurrence of condition will improve Ability to disclose and discuss suicidal ideas Ability to demonstrate self-control will improve Compliance with prescribed medications will improve  Medication Management: Evaluate patient's response, side effects, and tolerance of medication regimen.  Therapeutic Interventions: 1 to 1 sessions, Unit Group sessions and Medication administration.  Evaluation of Outcomes: Progressing  Physician Treatment Plan for Secondary Diagnosis: Principal Problem:   MDD (major depressive disorder)  Long Term Goal(s): Improvement in symptoms so as ready for discharge   Short Term Goals:  Ability to identify changes in lifestyle to reduce recurrence of condition will improve Ability to disclose and discuss suicidal ideas Ability to demonstrate self-control will improve Compliance with prescribed medications will improve     Medication Management: Evaluate patient's response, side effects, and tolerance of medication regimen.  Therapeutic Interventions: 1 to 1 sessions, Unit Group sessions and Medication administration.  Evaluation of Outcomes: Progressing   RN Treatment Plan for Primary Diagnosis: MDD (major depressive disorder) Long Term Goal(s): Knowledge of disease and therapeutic regimen to maintain health will improve  Short Term Goals: Ability to remain free from injury will improve, Ability to demonstrate self-control, Ability to verbalize feelings will improve, Ability to identify and develop effective coping behaviors will improve, and Compliance with prescribed medications will improve  Medication Management: RN will administer medications as ordered by provider, will assess and evaluate patient's response and provide education to patient for prescribed medication. RN will report any adverse and/or side effects to prescribing provider.  Therapeutic Interventions: 1 on 1 counseling sessions, Psychoeducation, Medication administration, Evaluate responses to treatment, Monitor vital signs and CBGs as ordered, Perform/monitor CIWA, COWS, AIMS and Fall Risk screenings as ordered, Perform wound care treatments as ordered.  Evaluation of Outcomes: Progressing   LCSW Treatment Plan for Primary Diagnosis: MDD (major depressive disorder) Long Term Goal(s): Safe transition to appropriate next level of care at discharge, Engage patient in therapeutic group addressing interpersonal concerns.  Short Term Goals: Engage patient in aftercare planning with referrals and resources, Increase social support, Increase emotional regulation, Facilitate acceptance of mental health  diagnosis and concerns, Identify triggers associated with mental health/substance abuse issues, and Increase skills for wellness and recovery  Therapeutic Interventions: Assess for all discharge needs, 1 to 1 time with Social worker, Explore available resources and support systems, Assess for adequacy in community support network, Educate family and significant other(s) on suicide prevention, Complete Psychosocial Assessment, Interpersonal group therapy.  Evaluation of Outcomes: Progressing   Progress in Treatment: Attending groups: Yes. Participating in groups: Yes. Taking medication as prescribed: Yes. Toleration medication: Yes. Family/Significant other contact made: No, will contact:  Stepfather Kerry Dory [phone] Patient understands diagnosis: Yes. Discussing patient identified problems/goals with staff: Yes. Medical problems stabilized or resolved: Yes. Denies suicidal/homicidal ideation: Yes. Issues/concerns per patient self-inventory: No.     New problem(s) identified: No, Describe:  none reported   New Short Term/Long Term Goal(s): medication stabilization, elimination of SI thoughts, development of comprehensive mental wellness plan.      Patient Goals:  "Lower my depression and get my medications right"   Discharge Plan or Barriers: Patient recently admitted. CSW will continue to follow and assess for appropriate referrals and possible  discharge planning.      Reason for Continuation of Hospitalization: Depression Medication stabilization Suicidal ideation   Estimated Length of Stay: 3-5 days  Last 3 [place] Suicide Severity Risk Score: Flowsheet Row [facility] (Current[facility] Most recent reading at 05/03/2023 10:12 PM ED from 05/03/2023 in [facility] Most recent reading at 05/03/2023  7:26 PM ED from 12/07/2022 in [facility] Most recent  reading at 12/07/2022  3:23 PM  C-SSRS RISK CATEGORY Low Risk No Risk No Risk       Last PHQ 2/9 Scores:    04/14/2023    5:22 PM 07/16/2022    3:52 PM  Depression screen PHQ 2/9  Decreased Interest 1 2  Down, Depressed, Hopeless 2 3  PHQ - 2 Score 3 5  Altered sleeping 2 3  Tired, decreased energy 2 2  Change in appetite 2 2  Feeling bad or failure about yourself  2 3  Trouble concentrating 2 2  Moving slowly or fidgety/restless  0  Suicidal thoughts 0 0  PHQ-9 Score 13 17  Difficult doing work/chores Somewhat difficult Somewhat difficult    Scribe for Treatment Team: Kathi Der, LCSWA 05/09/2023 1:10 PM

## 2023-05-09 NOTE — [facility] Group Notes (Signed)
Spiritual care group on grief and loss facilitated by Chaplain Dyanne Carrel, [facility]  Group Goal: Support / Education around grief and loss  Members engage in facilitated group support and psycho-social education.  Group Description:  Following introductions and group rules, group members engaged in facilitated group dialogue and support around topic of loss, with particular support around experiences of loss in their lives. Group Identified types of loss (relationships / self / things) and identified patterns, circumstances, and changes that precipitate losses. Reflected on thoughts / feelings around loss, normalized grief responses, and recognized variety in grief experience. Group encouraged individual reflection on safe space and on the coping skills that they are already utilizing.  Group drew on Adlerian / Rogerian and narrative framework  Patient Progress: Hailey Perez attended group.  Though verbal participation was minimal, she demonstrated engagement in group conversation.

## 2023-05-09 NOTE — Progress Notes (Signed)
LCSW faxed over updated clinicals to [facility] [phone] regarding patient. Fax confirmed. Per [place] in Admissions, they can accept the patient on tomorrow 12/3 and provide transportation if the patient is ready. LCSW followed up with MD to confirm appropriateness for discharge. MD confirmed. LCSW contacted facility to inform and patient has been placed on their admit list for tomorrow. Driver will call in the morning to inform of ETA. No other needs to report at this time.   LCSW went and spoke with patient to provide an update regarding plan. Patient expressed understanding and appreciation for LCSW assistance with securing placement. Patient aware that facility will call in the morning regarding a time and she will be informed. No other needs reported at this time. LCSW will continue to follow and provide support to patient while on unit. Updates to be provided as received.   Fernande Boyden, LCSW Clinical Social Worker [facility]

## 2023-05-09 NOTE — Progress Notes (Signed)
   05/09/23 1012  Psych Admission Type (Psych Patients Only)  Admission Status Voluntary  Psychosocial Assessment  Patient Complaints Depression;Substance abuse  Eye Contact Fair  Facial Expression Flat  Affect Appropriate to circumstance  Speech Logical/coherent  Interaction Cautious  Motor Activity Other (Comment) (WNL)  Appearance/Hygiene Unremarkable  Behavior Characteristics Cooperative  Mood Pleasant  Thought Process  Coherency WDL  Content WDL  Delusions None reported or observed  Perception WDL  Hallucination None reported or observed  Judgment Poor  Confusion None  Danger to Self  Current suicidal ideation? Denies  Self-Injurious Behavior No self-injurious ideation or behavior indicators observed or expressed   Agreement Not to Harm Self Yes  Description of Agreement verbal  Danger to Others  Danger to Others None reported or observed

## 2023-05-09 NOTE — Progress Notes (Signed)
[facility] MD Progress Note  05/09/2023 8:13 AM LESSA CATOR  MRN:  [id number]  Principal Problem: MDD (major depressive disorder) Diagnosis: Principal Problem:   MDD (major depressive disorder)   Reason for Admission:  MIRRA MICHELLE is a 47 year old female with a history of unspecified depression admitted for suicidal ideations in the setting of depression and recent substance relapse (admitted on 05/03/2023, total  LOS: 6 days )  Chart Review from last 24 hours:  The patient's chart was reviewed and nursing notes were reviewed. The patient's case was discussed in multidisciplinary team meeting.   - Overnight events to report per chart review / staff report: no notable overnight events to report - Patient received all scheduled medications - Patient did not receive any PRN medications  Information Obtained Today During Patient Interview: The patient was seen and evaluated on the unit. On assessment today the patient reports feeling depressed 8/10 which was unchanged from yesterday. She denies feeling anxious. She denies cravings for any substances.   She reports only partial group attendance as she says she sleeps until noon ("not a morning person"). Patient was unable to describe any coping skills they have developed while hospitalized.  Patient endorses excessive sleep; endorses good appetite.  Patient does not endorse any side-effects they attribute to medications.  She reports having tried sertraline in the past but did find it useful. She reports not having tried fluoxetine previously.  Past Psychiatric History: Significant for long history of opioid abuse, substance use disorder, depression, prior incarceration and prior treatment at [facility] [place].  Past Medical History:  Past Medical History:  Diagnosis Date   Substance abuse (HCC)     Family Psychiatric History: unknown Social History: homeless  Current Medications: Current Facility-Administered Medications  Medication  Dose Route Frequency Provider Last Rate Last Admin   acetaminophen (TYLENOL) tablet 650 mg  650 mg Oral Q6H PRN Eligha Bridegroom, NP       alum & mag hydroxide-simeth (MAALOX/MYLANTA) 200-200-20 MG/5ML suspension 30 mL  30 mL Oral Q4H PRN Eligha Bridegroom, NP       buprenorphine-naloxone (SUBOXONE) 8-2 mg per SL tablet 1 tablet  1 tablet Sublingual TID Eligha Bridegroom, NP   1 tablet at 05/08/23 1755   haloperidol (HALDOL) tablet 5 mg  5 mg Oral TID PRN Eligha Bridegroom, NP       And   diphenhydrAMINE (BENADRYL) capsule 50 mg  50 mg Oral TID PRN Eligha Bridegroom, NP       haloperidol lactate (HALDOL) injection 5 mg  5 mg Intramuscular TID PRN Eligha Bridegroom, NP       And   diphenhydrAMINE (BENADRYL) injection 50 mg  50 mg Intramuscular TID PRN Eligha Bridegroom, NP       And   LORazepam (ATIVAN) injection 2 mg  2 mg Intramuscular TID PRN Eligha Bridegroom, NP       hydrOXYzine (ATARAX) tablet 25 mg  25 mg Oral TID PRN Eligha Bridegroom, NP       magnesium hydroxide (MILK OF MAGNESIA) suspension 30 mL  30 mL Oral Daily PRN Eligha Bridegroom, NP       nicotine (NICODERM CQ - dosed in mg/24 hours) patch 14 mg  14 mg Transdermal Daily Sindy Guadeloupe, NP   14 mg at 05/08/23 0846   nitrofurantoin (macrocrystal-monohydrate) (MACROBID) capsule 100 mg  100 mg Oral Q12H Rex Kras, MD   100 mg at 05/08/23 1949   PARoxetine (PAXIL) tablet 20 mg  20 mg Oral Daily Rex Kras,  MD   20 mg at 05/08/23 0845   traZODone (DESYREL) tablet 50 mg  50 mg Oral QHS PRN Eligha Bridegroom, NP   50 mg at 05/07/23 2119    Lab Results: No results found for this or any previous visit (from the past 48 hour(s)).  Blood Alcohol level:  Lab Results  Component Value Date   ETH <10 05/03/2023   ETH <10 12/07/2022    Metabolic Labs: Lab Results  Component Value Date   HGBA1C 5.3 12/07/2022   MPG 105.41 12/07/2022   Lab Results  Component Value Date   PROLACTIN 11.5 12/07/2022   Lab Results  Component  Value Date   CHOL 150 05/03/2023   TRIG 145 05/03/2023   HDL 47 05/03/2023   CHOLHDL 3.2 05/03/2023   VLDL 29 05/03/2023   LDLCALC 74 05/03/2023   LDLCALC 114 (H) 12/07/2022    Physical Findings: AIMS: No  CIWA:    COWS:     Psychiatric Specialty Exam: General Appearance:  Casual; Disheveled   Eye Contact:  Fair   Speech:  Clear and Coherent   Volume:  Normal   Mood:  Anxious; Depressed   Affect:  Restricted   Thought Content:  Rumination; Perseveration   Suicidal Thoughts: No data recorded  Homicidal Thoughts: No data recorded  Thought Process:  Coherent   Orientation:  Full (Time, Place and Person)     Memory:  Immediate Fair; Remote Fair; Recent Fair   Judgment:  Fair   Insight:  Fair   Concentration:  Fair   Recall:  [employer] of Knowledge:  Fair   Language:  Fair   Psychomotor Activity: No data recorded  Assets:  Communication Skills; Desire for Improvement   Sleep: No data recorded   Review of Systems Review of Systems  Constitutional: Negative.   Respiratory: Negative.    Cardiovascular: Negative.   Gastrointestinal: Negative.   Genitourinary: Negative.   Psychiatric/Behavioral:         Psychiatric subjective data addressed in PSE or HPI / daily subjective report    Vital Signs: Blood pressure 97/73, pulse 63, temperature 98.2 F (36.8 C), temperature source Oral, resp. rate 18, height 5\' 1"  (1.549 m), weight 63 kg, SpO2 100%. Body mass index is 26.26 kg/m. Physical Exam Vitals and nursing note reviewed.  HENT:     Head: Normocephalic and atraumatic.  Pulmonary:     Effort: Pulmonary effort is normal.  Musculoskeletal:     Cervical back: Normal range of motion.  Neurological:     General: No focal deficit present.     Mental Status: She is alert.     Assets  Assets: Manufacturing systems engineer; Desire for Improvement   Treatment Plan Summary: Daily contact with patient to assess and evaluate symptoms and  progress in treatment and Medication management  Diagnoses / Active Problems: MDD (major depressive disorder) Principal Problem:   MDD (major depressive disorder)   ASSESSMENT: FILIPPA DIMAANO is a 47 year old female with a history of unspecified depression admitted for suicidal ideations in the setting of depression and recent substance relapse  PLAN: Safety and Monitoring:  -- Voluntary admission to inpatient psychiatric unit for safety, stabilization and treatment  -- Daily contact with patient to assess and evaluate symptoms and progress in treatment  -- Patient's case to be discussed in multi-disciplinary team meeting  -- Observation Level : q15 minute checks  -- Vital signs:  q12 hours  -- Precautions: suicide, elopement, and assault  2. Interventions (  medications, psychoeducation, etc):  -- start venlafaxine 37.5 24-hr capsule for depressive symptoms -- stop paroxetine -- continue buprenorphine-naloxone 8-2 mg three times daily -- medical regimen: nitrofurantoin  -- Patient in need of nicotine replacement; nicotine polacrilex (gum) and nicotine patch 14 mg / 24 hours ordered. Smoking cessation encouraged  PRN medications for symptomatic management:              -- continue acetaminophen 650 mg every 6 hours as needed for mild to moderate pain, fever, and headaches              -- continue hydroxyzine 25 mg three times a day as needed for anxiety              -- continue bismuth subsalicylate 524 mg oral chewable tablet every 3 hours as needed for indigestion              -- continue senna 8.6 mg oral at bedtime as needed and polyethylene glycol 17 g oral daily as needed for mild to moderate constipation              -- continue ondansetron 8 mg every 8 hours as needed for nausea or vomiting              -- continue aluminum-magnesium hydroxide + simethicone 30 mL every 4 hours as needed for heartburn              -- continue trazodone 50 mg at bedtime as needed for  insomnia  -- As needed agitation protocol in-place  The risks/benefits/side-effects/alternatives to the above medication were discussed in detail with the patient and time was given for questions. The patient consents to medication trial. FDA black box warnings, if present, were discussed.  The patient is agreeable with the medication plan, as above. We will monitor the patient's response to pharmacologic treatment, and adjust medications as necessary.  3. Routine and other pertinent labs:             -- Metabolic profile:  BMI: Body mass index is 26.26 kg/m.  Prolactin: Lab Results  Component Value Date   PROLACTIN 11.5 12/07/2022    Lipid Panel: Lab Results  Component Value Date   CHOL 150 05/03/2023   TRIG 145 05/03/2023   HDL 47 05/03/2023   CHOLHDL 3.2 05/03/2023   VLDL 29 05/03/2023   LDLCALC 74 05/03/2023   LDLCALC 114 (H) 12/07/2022    HbgA1c: Hgb A1c MFr Bld (%)  Date Value  12/07/2022 5.3    TSH: TSH (uIU/mL)  Date Value  05/03/2023 0.332 (L)    EKG monitoring: QTc: 442 on 11/26//2024  4. Group Therapy:  -- Encouraged patient to participate in unit milieu and in scheduled group therapies   -- Short Term Goals: Ability to identify changes in lifestyle to reduce recurrence of condition, verbalize feelings, identify and develop effective coping behaviors, maintain clinical measurements within normal limits, and identify triggers associated with substance abuse/mental health issues will improve. Improvement in ability to disclose and discuss suicidal ideas, demonstrate self-control, and comply with prescribed medications.  -- Long Term Goals: Improvement in symptoms so as ready for discharge -- Patient is encouraged to participate in group therapy while admitted to the psychiatric unit. -- We will address other chronic and acute stressors, which contributed to the patient's MDD (major depressive disorder) in order to reduce the risk of self-harm at  discharge.  5. Discharge Planning:   -- Social work and case management to assist  with discharge planning and identification of hospital follow-up needs prior to discharge  -- Estimated LOS: 3 days  -- Discharge Concerns: Need to establish a safety plan; Medication compliance and effectiveness  -- Discharge Goals: Return home with outpatient referrals for mental health follow-up including medication management/psychotherapy  I certify that inpatient services furnished can reasonably be expected to improve the patient's condition.   Signed: Augusto Gamble, MD 05/09/2023, 8:13 AM

## 2023-05-09 NOTE — Group Note (Signed)
Date:  05/09/2023 Time:  9:18 PM  Group Topic/Focus:  Alcoholics Anonymous Meeting    Participation Level:  Did Not Attend  Participation Quality:   N/A  Affect:   N/A  Cognitive:   N/A  Insight: None  Engagement in Group:   N/A  Modes of Intervention:   N/A  Additional Comments:  Patient did not attend AA  Kennieth Francois 05/09/2023, 9:18 PM

## 2023-05-09 NOTE — Plan of Care (Signed)
  Problem: Medication: Goal: Compliance with prescribed medication regimen will improve Outcome: Progressing   Problem: Education: Goal: Knowledge of [facility] Education information/materials will improve Outcome: Progressing Goal: Emotional status will improve Outcome: Progressing   Problem: Activity: Goal: Sleeping patterns will improve Outcome: Progressing

## 2023-05-09 NOTE — Group Note (Signed)
Recreation Therapy Group Note   Group Topic:Healthy Decision Making  Group Date: 05/09/2023 Start Time: 0935 End Time: 1010 Facilitators: Yeilyn Gent-McCall, LRT,CTRS Location: [address] Dayroom   Group Topic: Decision Making, Problem Solving, Communication  Goal Area(s) Addresses:  Patient will effectively work with peer towards shared goal.  Patient will identify factors that guided their decision making.  Patient will pro-socially communicate ideas during group session.   Intervention: Survival Scenario - pencil, paper  Group Description: Patients were given a scenario that they were going to be stranded on a deserted [place] for several months before being rescued. Writer tasked them with making a list of 15 things they would choose to bring with them for "survival". The list of items was prioritized most important to least. Each patient would come up with their own list, then work together to create a new list of 15 items while in a group of 3-5 peers. LRT discussed each person's list and how it differed from others. The debrief included discussion of priorities, good decisions versus bad decisions, and how it is important to think before acting so we can make the best decision possible. LRT tied the concept of effective communication among group members to patient's support systems outside of the hospital and its benefit post discharge.  Education: Pharmacist, community, Priorities, Support System, Discharge Planning   Education Outcome: Acknowledges education/In group clarification/Needs additional education   Affect/Mood: N/A   Participation Level: Did not attend    Clinical Observations/Individualized Feedback:     Plan: Continue to engage patient in RT group sessions 2-3x/week.   Hailey Perez, LRT,CTRS  05/09/2023 12:12 PM

## 2023-05-09 NOTE — Plan of Care (Signed)
  Problem: Activity: Goal: Interest or engagement in activities will improve Outcome: Progressing   Problem: Coping: Goal: Ability to verbalize frustrations and anger appropriately will improve Outcome: Progressing   Problem: Medication: Goal: Compliance with prescribed medication regimen will improve Outcome: Progressing

## 2023-05-10 MED ORDER — BUPRENORPHINE HCL-NALOXONE HCL 8-2 MG SL SUBL: 1.0000 | SUBLINGUAL_TABLET | Freq: Three times a day (TID) | SUBLINGUAL | 0 refills | Status: AC

## 2023-05-10 MED ORDER — TRAZODONE HCL 50 MG PO TABS: 50.0000 mg | ORAL_TABLET | Freq: Every evening | ORAL | 0 refills | Status: DC | PRN

## 2023-05-10 MED ORDER — NITROFURANTOIN MONOHYD MACRO 100 MG PO CAPS: 100.0000 mg | ORAL_CAPSULE | Freq: Two times a day (BID) | ORAL | 0 refills | Status: DC

## 2023-05-10 MED ORDER — NICOTINE 14 MG/24HR TD PT24: 14.0000 mg | MEDICATED_PATCH | Freq: Every day | TRANSDERMAL | 0 refills | Status: AC

## 2023-05-10 MED ORDER — NITROFURANTOIN MONOHYD MACRO 100 MG PO CAPS: 100.0000 mg | ORAL_CAPSULE | Freq: Two times a day (BID) | ORAL | 0 refills | Status: AC

## 2023-05-10 MED ORDER — NICOTINE POLACRILEX 2 MG MT GUM: 2.0000 mg | CHEWING_GUM | OROMUCOSAL | 0 refills | Status: AC | PRN

## 2023-05-10 MED ORDER — NICOTINE 14 MG/24HR TD PT24: 14.0000 mg | MEDICATED_PATCH | Freq: Every day | TRANSDERMAL | 0 refills | Status: DC

## 2023-05-10 MED ORDER — NICOTINE POLACRILEX 2 MG MT GUM: 2.0000 mg | CHEWING_GUM | OROMUCOSAL | 0 refills | Status: DC | PRN

## 2023-05-10 MED ORDER — VENLAFAXINE HCL ER 37.5 MG PO CP24: 37.5000 mg | ORAL_CAPSULE | Freq: Every day | ORAL | 0 refills | Status: DC

## 2023-05-10 NOTE — Progress Notes (Signed)
  [facility] Adult Case Management Discharge Plan :  Will you be returning to the same living situation after discharge:  No. At discharge, do you have transportation home?: Yes,  Patient will be transported to [facility] on today 05/10/2023 with transport provided by [facility].  Do you have the ability to pay for your medications: Yes,  Patient has [facility]   Release of information consent forms completed and in the chart;  Patient's signature needed at discharge.  Patient to Follow up at:  Follow-up Information     BEHAVIORAL HEALTH INTENSIVE CHEMICAL DEPENDENCY. Schedule an appointment as soon as possible for a visit.   Specialty: Behavioral Health Contact information: [address] [employer] [address] [place] [address] [phone]        [employer] [employer]. Go on 05/10/2023.   Why: Patient to be admitted to [facility] on today 05/10/2023 with transportation provided by facility. Patient will receive treatment for 30-45 days. Patient encouraged to follow up with CDIOP services at [facility] once discharged from facility. Contact information: [address], [place], [place] [address] Phone: [phone]                Next level of care provider has access to [facility] Link:no  Safety Planning and Suicide Prevention discussed: No. Patient refused collateral.      Has patient been referred to the Quitline?: Patient is transferring to [facility] on today for their 30-45 day residential program. Resources discussed with patient and she is aware of resource.   Patient has been referred for addiction treatment:  Patient is transferring to [facility] on today for their 30-45 day residential program.  Loleta Dicker, LCSW 05/10/2023, 9:53 AM

## 2023-05-10 NOTE — Discharge Planning (Signed)
LCSW has attempted to call [facility] regarding ETA for patient, however have not received an answer. LCSW will continue to follow up regarding transport. Discharge should still be prepared for today.   Fernande Boyden, LCSW Clinical Social Worker [facility]

## 2023-05-10 NOTE — Progress Notes (Signed)
Pt discharged to lobby. Pt was stable and appreciative at that time. All papers and prescriptions were given and valuables returned. Verbal understanding expressed. Denies SI/HI and A/VH. Pt given opportunity to express concerns and ask questions.  

## 2023-05-10 NOTE — Discharge Summary (Addendum)
Physician Discharge Summary Note Patient:  Hailey Perez is an 47 y.o., female MRN:  [id number] DOB:  August 25, 1975 Patient phone:  [phone] (home)  Patient address:   [address] [place] [address],  Total Time spent with patient: 45 minutes  Date of Admission:  05/03/2023 Date of Discharge: 05/10/2023  Reason for Admission:   [name] is a 47 year old female with a history of unspecified depression admitted for suicidal ideations in the setting of depression and recent substance relapse   Principal Problem: MDD (major depressive disorder) Discharge Diagnoses: Principal Problem:   MDD (major depressive disorder)   Past Psychiatric History: Significant for long history of opioid abuse, substance use disorder, depression, prior incarceration and prior treatment at [facility] [place]   Past Medical History:  Past Medical History:  Diagnosis Date   Substance abuse [facility])     Past Surgical History:  Procedure Laterality Date   I & D EXTREMITY Right 12/12/2014   Procedure: IRRIGATION AND DEBRIDEMENT EXTREMITY;  Surgeon: Knute Neu, MD;  Location: [facility] ORS;  Service: Plastics;  Laterality: Right;   TUBAL LIGATION     Family Psychiatric History: unknown Social History: homeless  Hospital Course:   During the patient's hospitalization, patient had extensive initial psychiatric evaluation, and follow-up psychiatric evaluations every day.  The following medications were managed: Scheduled Meds:  buprenorphine-naloxone  1 tablet Sublingual TID   nicotine  14 mg Transdermal Daily   nitrofurantoin (macrocrystal-monohydrate)  100 mg Oral Q12H   venlafaxine XR  37.5 mg Oral Q breakfast   PRN Meds:.acetaminophen, alum & mag hydroxide-simeth, bismuth subsalicylate, haloperidol **AND** LORazepam **AND** diphenhydrAMINE, haloperidol lactate **AND** LORazepam **AND** diphenhydrAMINE, hydrOXYzine, nicotine polacrilex, ondansetron, polyethylene glycol, senna, traZODone  The  patient denies any side effects to prescribed psychiatric medication.  Gradually, patient started adjusting to milieu. The patient was evaluated each day by a clinical provider to ascertain response to treatment. Improvement was noted by the patient's report of decreasing symptoms, improved sleep and appetite, affect, medication tolerance, behavior, and participation in unit programming.  Patient was asked each day to complete a self inventory noting mood, mental status, pain, new symptoms, anxiety and concerns.   Symptoms were reported as significantly decreased or resolved completely by discharge.  The patient reports that their mood is stable.  The patient denied having suicidal thoughts for more than 48 hours prior to discharge.  Patient denies having homicidal thoughts.  Patient denies having auditory hallucinations.  Patient denies any visual hallucinations or other symptoms of psychosis.  The patient was motivated to continue taking medication with a goal of continued improvement in mental health.   Symptoms were reported as significantly decreased or resolved completely by discharge.   On day of discharge, the patient reports that their mood is stable. The patient denied having suicidal thoughts for more than 48 hours prior to discharge.  Patient denies having homicidal thoughts.  Patient denies having auditory hallucinations.  Patient denies any visual hallucinations or other symptoms of psychosis. The patient was motivated to continue taking medication with a goal of continued improvement in mental health.   The patient reports their target psychiatric symptoms of relapse on substances responded well to the psychiatric medications, and the patient reports overall benefit from psychiatric hospitalization. Supportive psychotherapy was provided to the patient. The patient also participated in regular group therapy while hospitalized. Coping skills, problem solving as well as relaxation therapies  were also part of the unit programming.  Labs were reviewed with the patient,  and abnormal results were discussed with the patient.  The patient is able to verbalize their individual safety plan to this provider.  # It is recommended to the patient to continue psychiatric medications as prescribed, after discharge from the hospital.    # It is recommended to the patient to follow up with your outpatient psychiatric provider and PCP.  # It was discussed with the patient, the impact of alcohol, drugs, tobacco have been there overall psychiatric and medical wellbeing, and total abstinence from substance use was recommended.  # Prescriptions provided or sent directly to preferred pharmacy at discharge. Patient agreeable to plan. Given opportunity to ask questions. Appears to feel comfortable with discharge.    # In the event of worsening symptoms, the patient is instructed to call the crisis hotline, 911 and or go to the nearest ED for appropriate evaluation and treatment of symptoms. To follow-up with primary care provider for other medical issues, concerns and or health care needs  # Patient was discharged  to [facility]  with a plan to follow up as noted below.  On day of discharge patient is denying suicidal or homicidal thoughts and feels ready to go to [employer] [employer].  Physical Findings: AIMS:  , ,  ,  ,    CIWA:    COWS:     Musculoskeletal: Strength & Muscle Tone: within normal limits Gait & Station: normal Patient leans: N/A  Psychiatric Specialty Exam  Presentation  General Appearance: Casual  Eye Contact:Fleeting  Speech:Clear and Coherent  Speech Volume:Normal  Handedness:Right   Mood and Affect  Mood:-- ("tired")  Duration of Depression Symptoms: Greater than two weeks  Affect:Appropriate; Congruent; Full Range   Thought Process  Thought Processes:Coherent; Goal Directed; Linear  Descriptions of  Associations:Intact  Orientation:Full (Time, Place and Person)  Thought Content:WDL  History of Schizophrenia/Schizoaffective disorder:No  Duration of Psychotic Symptoms:N/A  Hallucinations:Hallucinations: None  Ideas of Reference:None  Suicidal Thoughts:Suicidal Thoughts: No SI Passive Intent and/or Plan: Without Intent  Homicidal Thoughts:Homicidal Thoughts: No   Sensorium  Memory:Immediate Good; Recent Good; Remote Good  Judgment:Fair  Insight:Fair   Executive Functions  Concentration:Good  Attention Span:Good  Recall:Good  Fund of Knowledge:Good  Language:Good   Psychomotor Activity  Psychomotor Activity:Psychomotor Activity: Normal   Assets  Assets:Resilience   Sleep  Sleep:Sleep: Fair   Physical Exam: Physical Exam Vitals and nursing note reviewed.  HENT:     Head: Normocephalic and atraumatic.  Pulmonary:     Effort: Pulmonary effort is normal.  Musculoskeletal:     Cervical back: Normal range of motion.  Neurological:     General: No focal deficit present.     Mental Status: She is alert.    Review of Systems  Constitutional: Negative.   Respiratory: Negative.    Cardiovascular: Negative.   Gastrointestinal: Negative.   Genitourinary: Negative.   Neurological:  Positive for headaches.  Psychiatric/Behavioral:         Psychiatric subjective data addressed in PSE or HPI / daily subjective report   Blood pressure 91/63, pulse 75, temperature 98.1 F (36.7 C), temperature source Oral, resp. rate 18, height 5\' 1"  (1.549 m), weight 63 kg, SpO2 98%. Body mass index is 26.26 kg/m.  Social History   Tobacco Use  Smoking Status Every Day   Current packs/day: 0.50   Types: Cigarettes  Smokeless Tobacco Never   Tobacco Cessation:  A prescription for an FDA-approved tobacco cessation medication provided at discharge  Blood Alcohol level:  Lab  Results  Component Value Date   [facility] <10 05/03/2023   ETH <10 12/07/2022    Metabolic  Disorder Labs:  Lab Results  Component Value Date   HGBA1C 5.3 12/07/2022   MPG 105.41 12/07/2022   Lab Results  Component Value Date   PROLACTIN 11.5 12/07/2022   Lab Results  Component Value Date   CHOL 150 05/03/2023   TRIG 145 05/03/2023   HDL 47 05/03/2023   CHOLHDL 3.2 05/03/2023   VLDL 29 05/03/2023   LDLCALC 74 05/03/2023   LDLCALC 114 (H) 12/07/2022    See Psychiatric Specialty Exam and Suicide Risk Assessment completed by Attending Physician prior to discharge.  Discharge destination:  [facility]  Is patient on multiple antipsychotic therapies at discharge:  No   Has Patient had three or more failed trials of antipsychotic monotherapy by history:  No  Recommended Plan for Multiple Antipsychotic Therapies: NA  Discharge Instructions     Activity as tolerated - No restrictions   Complete by: As directed    Diet - low sodium heart healthy   Complete by: As directed       Allergies as of 05/10/2023   No Known Allergies      Medication List     STOP taking these medications    FLUoxetine 20 MG capsule Commonly known as: PROZAC   Suboxone 8-2 MG Film Generic drug: Buprenorphine HCl-Naloxone HCl Replaced by: buprenorphine-naloxone 8-2 mg Subl SL tablet       TAKE these medications      Indication  buprenorphine-naloxone 8-2 mg Subl SL tablet Commonly known as: SUBOXONE Place 1 tablet under the tongue 3 (three) times daily for 3 days. Replaces: Suboxone 8-2 MG Film  Indication: Opioid Dependence   nicotine 14 mg/24hr patch Commonly known as: NICODERM CQ - dosed in mg/24 hours Place 1 patch (14 mg total) onto the skin daily. Start taking on: May 11, 2023  Indication: Nicotine Addiction   nicotine polacrilex 2 MG gum Commonly known as: NICORETTE Take 1 each (2 mg total) by mouth as needed for smoking cessation.  Indication: Nicotine Addiction   nitrofurantoin (macrocrystal-monohydrate) 100 MG capsule Commonly known  as: MACROBID Take 1 capsule (100 mg total) by mouth every 12 (twelve) hours for 7 days. What changed: when to take this  Indication: Simple Infection of the Urinary Tract   traZODone 50 MG tablet Commonly known as: DESYREL Take 1 tablet (50 mg total) by mouth at bedtime as needed for sleep.  Indication: Trouble Sleeping   venlafaxine XR 37.5 MG 24 hr capsule Commonly known as: EFFEXOR-XR Take 1 capsule (37.5 mg total) by mouth daily with breakfast. Start taking on: May 11, 2023  Indication: Major Depressive Disorder        Follow-up Information     BEHAVIORAL HEALTH INTENSIVE CHEMICAL DEPENDENCY. Schedule an appointment as soon as possible for a visit.   Specialty: Behavioral Health Contact information: [address] [place] [address] [place] [address] [phone]        [employer]. Go on 05/10/2023.   Why: Patient to be admitted to [facility] on today 05/10/2023 with transportation provided by facility. Patient will receive treatment for 30-45 days. Patient encouraged to follow up with CDIOP services at [facility] once discharged from facility. Contact information: [address], [place], [place] [address] Phone: [phone]                Follow-up recommendations / Comments: Activity: as tolerated  Diet: heart healthy  Other: -Follow-up with your outpatient psychiatric provider -instructions on appointment date, time, and address (location) are provided to you in discharge paperwork.  -Take your psychiatric medications as prescribed at discharge - instructions are provided to you in the discharge paperwork  -Follow-up with outpatient primary care doctor and other specialists -for management of chronic medical disease, including: health maintenance checks and UTI  -Testing: Follow-up with outpatient provider for abnormal lab results: low TSH  -Recommend abstinence from alcohol, tobacco, and other illicit drug use at  discharge.   -If your psychiatric symptoms recur, worsen, or if you have side effects to your psychiatric medications, call your outpatient psychiatric provider, 911, [phone] or go to the nearest emergency department.  -If suicidal thoughts recur, call your outpatient psychiatric provider, 911, [phone] or go to the nearest emergency department.  Signed: Augusto Gamble, MD 05/10/2023, 10:29 AM

## 2023-05-10 NOTE — [facility] Suicide Risk Assessment (Signed)
Suicide Risk Assessment  Discharge Assessment    [facility] Discharge Suicide Risk Assessment  Principal Problem: MDD (major depressive disorder) Discharge Diagnoses: Principal Problem:   MDD (major depressive disorder)   Reason for Admission: Hailey Perez is a 47 year old female with a history of unspecified depression admitted for suicidal ideations in the setting of depression and recent substance relapse   Hospital Summary During the patient's hospitalization, patient had extensive initial psychiatric evaluation, and follow-up psychiatric evaluations every day.   The following medications were managed: Scheduled Meds:  buprenorphine-naloxone  1 tablet Sublingual TID   nicotine  14 mg Transdermal Daily   nitrofurantoin (macrocrystal-monohydrate)  100 mg Oral Q12H   venlafaxine XR  37.5 mg Oral Q breakfast        PRN Meds:. acetaminophen, alum & mag hydroxide-simeth, bismuth subsalicylate, haloperidol **AND** LORazepam **AND** diphenhydrAMINE, haloperidol lactate **AND** LORazepam **AND** diphenhydrAMINE, hydrOXYzine, nicotine polacrilex, ondansetron, polyethylene glycol, senna, traZODone       The patient denies any side effects to prescribed psychiatric medication.   Gradually, patient started adjusting to milieu. The patient was evaluated each day by a clinical provider to ascertain response to treatment. Improvement was noted by the patient's report of decreasing symptoms, improved sleep and appetite, affect, medication tolerance, behavior, and participation in unit programming.  Patient was asked each day to complete a self inventory noting mood, mental status, pain, new symptoms, anxiety and concerns.   Symptoms were reported as significantly decreased or resolved completely by discharge.  The patient reports that their mood is stable.  The patient denied having suicidal thoughts for more than 48 hours prior to discharge.  Patient denies having homicidal thoughts.  Patient denies  having auditory hallucinations.  Patient denies any visual hallucinations or other symptoms of psychosis.  The patient was motivated to continue taking medication with a goal of continued improvement in mental health.    Symptoms were reported as significantly decreased or resolved completely by discharge.    On day of discharge, the patient reports that their mood is stable. The patient denied having suicidal thoughts for more than 48 hours prior to discharge.  Patient denies having homicidal thoughts.  Patient denies having auditory hallucinations.  Patient denies any visual hallucinations or other symptoms of psychosis. The patient was motivated to continue taking medication with a goal of continued improvement in mental health.    The patient reports their target psychiatric symptoms of relapse on substances responded well to the psychiatric medications, and the patient reports overall benefit from psychiatric hospitalization. Supportive psychotherapy was provided to the patient. The patient also participated in regular group therapy while hospitalized. Coping skills, problem solving as well as relaxation therapies were also part of the unit programming.   Labs were reviewed with the patient, and abnormal results were discussed with the patient.   The patient is able to verbalize their individual safety plan to this provider.   # It is recommended to the patient to continue psychiatric medications as prescribed, after discharge from the hospital.     # It is recommended to the patient to follow up with your outpatient psychiatric provider and PCP.   # It was discussed with the patient, the impact of alcohol, drugs, tobacco have been there overall psychiatric and medical wellbeing, and total abstinence from substance use was recommended.   # Prescriptions provided or sent directly to preferred pharmacy at discharge. Patient agreeable to plan. Given opportunity to ask questions. Appears to feel  comfortable with discharge.    #  In the event of worsening symptoms, the patient is instructed to call the crisis hotline, 911 and or go to the nearest ED for appropriate evaluation and treatment of symptoms. To follow-up with primary care provider for other medical issues, concerns and or health care needs   # Patient was discharged  to [facility]  with a plan to follow up as noted below.   On day of discharge patient is denying suicidal or homicidal thoughts and feels ready to go to [facility].  Total Time spent with patient: 1 hour  Musculoskeletal: Strength & Muscle Tone: within normal limits Gait & Station: normal Patient leans: N/A  Psychiatric Specialty Exam  Presentation  General Appearance: Casual  Eye Contact:Fleeting  Speech:Clear and Coherent  Speech Volume:Normal  Handedness:Right   Mood and Affect  Mood:-- ("tired")  Duration of Depression Symptoms: Greater than two weeks  Affect:Appropriate; Congruent; Full Range   Thought Process  Thought Processes:Coherent; Goal Directed; Linear  Descriptions of Associations:Intact  Orientation:Full (Time, Place and Person)  Thought Content:WDL  History of Schizophrenia/Schizoaffective disorder:No  Duration of Psychotic Symptoms:N/A  Hallucinations:Hallucinations: None  Ideas of Reference:None  Suicidal Thoughts:Suicidal Thoughts: No SI Passive Intent and/or Plan: Without Intent  Homicidal Thoughts:Homicidal Thoughts: No   Sensorium  Memory:Immediate Good; Recent Good; Remote Good  Judgment:Fair  Insight:Fair   Executive Functions  Concentration:Good  Attention Span:Good  Recall:Good  Fund of Knowledge:Good  Language:Good   Psychomotor Activity  Psychomotor Activity:Psychomotor Activity: Normal   Assets  Assets:Resilience   Sleep  Sleep:Sleep: Fair   Physical Exam: Physical Exam Vitals and nursing note reviewed.  HENT:     Head:  Normocephalic and atraumatic.  Pulmonary:     Effort: Pulmonary effort is normal.  Musculoskeletal:     Cervical back: Normal range of motion.  Neurological:     General: No focal deficit present.     Mental Status: She is alert.    Review of Systems  Constitutional: Negative.   Respiratory: Negative.    Cardiovascular: Negative.   Gastrointestinal: Negative.   Genitourinary: Negative.   Neurological:  Positive for headaches.  Psychiatric/Behavioral:         Psychiatric subjective data addressed in PSE or HPI / daily subjective report   Blood pressure 91/63, pulse 75, temperature 98.1 F (36.7 C), temperature source Oral, resp. rate 18, height 5\' 1"  (1.549 m), weight 63 kg, SpO2 98%. Body mass index is 26.26 kg/m.  Mental Status Per Nursing Assessment::   On Admission:  NA  Demographic Factors:  Caucasian and Low socioeconomic status  Loss Factors: NA  Historical Factors: NA  Risk Reduction Factors:   NA  Continued Clinical Symptoms:  Alcohol/Substance Abuse/Dependencies More than one psychiatric diagnosis  Cognitive Features That Contribute To Risk:  None    Suicide Risk:  Mild: There are no identifiable suicide plans, no associated intent, mild dysphoria and related symptoms, good self-control (both objective and subjective assessment), few other risk factors, and identifiable protective factors, including available and accessible social support.   Follow-up Information     BEHAVIORAL HEALTH INTENSIVE CHEMICAL DEPENDENCY. Schedule an appointment as soon as possible for a visit.   Specialty: Behavioral Health Contact information: [address] [place] [address] [place] [address] [phone]        [facility]. Go on 05/10/2023.   Why: Patient to be admitted to [facility] on today 05/10/2023 with transportation provided by facility. Patient will receive treatment for 30-45 days. Patient encouraged  to follow up with  CDIOP services at [facility] once discharged from facility. Contact information: [address], [place], [place] [address] Phone: [phone]                Plan Of Care/Follow-up recommendations:  Activity: as tolerated  Diet: heart healthy  Other: -Follow-up with your outpatient psychiatric provider -instructions on appointment date, time, and address (location) are provided to you in discharge paperwork.  -Take your psychiatric medications as prescribed at discharge - instructions are provided to you in the discharge paperwork  -Follow-up with outpatient primary care doctor and other specialists -for management of chronic medical disease, including: health maintenance checks and UTI  -Testing: Follow-up with outpatient provider for abnormal lab results: low TSH  -Recommend abstinence from alcohol, tobacco, and other illicit drug use at discharge.   -If your psychiatric symptoms recur, worsen, or if you have side effects to your psychiatric medications, call your outpatient psychiatric provider, 911, [phone] or go to the nearest emergency department.  -If suicidal thoughts recur, call your outpatient psychiatric provider, 911, [phone] or go to the nearest emergency department.  Signed: Augusto Gamble, MD 05/10/2023, 10:24 AM

## 2023-05-10 NOTE — Discharge Instructions (Signed)
Dear Hailey Perez,  It was a pleasure to take care of you during your stay at [facility] where you were treated for your MDD (major depressive disorder).  While you were here, you were:  observed and cared for by our nurses and nursing assistants  treated with medications by your psychiatrists  evaluated with imaging / lab tests, and treated with medicines / procedures by your doctors  provided individual and group therapy by therapists  provided resources by our social workers and case managers  Please review the medication list provided to you at discharge and stop, start taking, or continue taking the medications listed there.  You should also follow-up with your primary care doctor, or start seeing one if you don't have one yet. If applicable, here are some scheduled follow-ups for you:  Follow-up Information     BEHAVIORAL HEALTH INTENSIVE CHEMICAL DEPENDENCY. Schedule an appointment as soon as possible for a visit.   Specialty: Behavioral Health Contact information: [address] [place] [address] [place] [address] [phone]        [facility]. Go on 05/10/2023.   Why: Patient to be admitted to [facility] on today 05/10/2023 with transportation provided by facility. Patient will receive treatment for 30-45 days. Patient encouraged to follow up with CDIOP services at [facility] once discharged from facility. Contact information: [address] [place], [place] [address] Phone: [phone]                 I recommend abstinence from alcohol, tobacco, and other illicit drug use.   If your psychiatric symptoms or suicidal thoughts recur, worsen, or if you have side effects to your psychiatric medications, call your outpatient psychiatric provider, 911, 988 or go to the nearest emergency department.  Take care!  Signed: Augusto Gamble, MD 05/10/2023, 10:12 AM  Naloxone (Narcan) can help reverse an overdose when  given to the victim quickly.  [facility] offers free naloxone kits and instructions/training on its use.  Add naloxone to your first aid kit and you can help save a life. A prescription can be filled at your local pharmacy or free kits are provided by the county.  Pick up your free kit at the following locations:   [place]:  [facility], [address] [place] [address] [phone]) [facility] [address] [place] [address] [phone]) [facility] [address] [place] [address]  [place]: [facility] [address] [place] [address] ([phone]) [facility] [address] [place] [address] [phone])

## 2023-05-10 NOTE — Discharge Planning (Signed)
LCSW followed up with [facility] [phone] to inquire about patient transport time. Per Admissions Coordinator Vonna Kotyk, estimated time of arrival is 11:00am for patient. LCSW informed MD and staff. Patient aware of transport. No concerns to report at this time. LCSW to sign off. Please inform if further LCSW needs arise prior to discharge.   Fernande Boyden, LCSW Clinical Social Worker [facility]

## 2023-05-16 ENCOUNTER — Telehealth (HOSPITAL_COMMUNITY): Payer: Self-pay | Admitting: Licensed Clinical Social Worker
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
## 2023-05-30 ENCOUNTER — Encounter (HOSPITAL_COMMUNITY): Payer: Self-pay | Admitting: Emergency Medicine

## 2023-05-30 ENCOUNTER — Emergency Department (HOSPITAL_COMMUNITY)
Admission: EM | Admit: 2023-05-30 | Discharge: 2023-05-31 | Disposition: A | Discharge status: Home / Self Care | Payer: 59 | Attending: Student | Admitting: Student

## 2023-05-30 ENCOUNTER — Other Ambulatory Visit: Payer: Self-pay

## 2023-05-30 DIAGNOSIS — F1210 Cannabis abuse, uncomplicated: Secondary | ICD-10-CM | POA: Diagnosis not present

## 2023-05-30 DIAGNOSIS — X31XXXA Exposure to excessive natural cold, initial encounter: Secondary | ICD-10-CM | POA: Insufficient documentation

## 2023-05-30 DIAGNOSIS — F1910 Other psychoactive substance abuse, uncomplicated: Secondary | ICD-10-CM

## 2023-05-30 DIAGNOSIS — R7309 Other abnormal glucose: Secondary | ICD-10-CM | POA: Diagnosis not present

## 2023-05-30 DIAGNOSIS — F1410 Cocaine abuse, uncomplicated: Secondary | ICD-10-CM | POA: Diagnosis not present

## 2023-05-30 DIAGNOSIS — T68XXXA Hypothermia, initial encounter: Secondary | ICD-10-CM | POA: Diagnosis present

## 2023-05-30 DIAGNOSIS — F1310 Sedative, hypnotic or anxiolytic abuse, uncomplicated: Secondary | ICD-10-CM | POA: Insufficient documentation

## 2023-05-30 DIAGNOSIS — F1510 Other stimulant abuse, uncomplicated: Secondary | ICD-10-CM | POA: Diagnosis not present

## 2023-05-30 NOTE — ED Triage Notes (Signed)
Patient presents due to possible hypothermia (95.7 temp). EMS was called by PD. She was sitting on the curb with her belongings. Patient is alert but would not talk to EMS.   EMS vitals: 128/86 BP 68 HR 16 RR 94% SPO2 on room air 253 CBG 95.7 Temp

## 2023-05-31 DIAGNOSIS — T68XXXA Hypothermia, initial encounter: Secondary | ICD-10-CM | POA: Diagnosis not present

## 2023-05-31 LAB — RAPID URINE DRUG SCREEN, HOSP PERFORMED
Amphetamines: POSITIVE — AB
Barbiturates: NOT DETECTED
Benzodiazepines: POSITIVE — AB
Cocaine: POSITIVE — AB
Opiates: NOT DETECTED
Tetrahydrocannabinol: POSITIVE — AB

## 2023-05-31 LAB — URINALYSIS, ROUTINE W REFLEX MICROSCOPIC
Bilirubin Urine: NEGATIVE
Glucose, UA: NEGATIVE mg/dL
Hgb urine dipstick: NEGATIVE
Ketones, ur: NEGATIVE mg/dL
Leukocytes,Ua: NEGATIVE
Nitrite: NEGATIVE
Protein, ur: NEGATIVE mg/dL
Specific Gravity, Urine: 1.02 (ref 1.005–1.030)
pH: 5 (ref 5.0–8.0)

## 2023-05-31 LAB — COMPREHENSIVE METABOLIC PANEL
ALT: 12 U/L (ref 0–44)
AST: 26 U/L (ref 15–41)
Albumin: 3.6 g/dL (ref 3.5–5.0)
Alkaline Phosphatase: 44 U/L (ref 38–126)
Anion gap: 9 (ref 5–15)
BUN: 12 mg/dL (ref 6–20)
CO2: 22 mmol/L (ref 22–32)
Calcium: 8.5 mg/dL — ABNORMAL LOW (ref 8.9–10.3)
Chloride: 109 mmol/L (ref 98–111)
Creatinine, Ser: 0.53 mg/dL (ref 0.44–1.00)
GFR, Estimated: >60 mL/min (ref >60)
Glucose, Bld: 110 mg/dL — ABNORMAL HIGH (ref 70–99)
Potassium: 3.9 mmol/L (ref 3.5–5.1)
Sodium: 140 mmol/L (ref 135–145)
Total Bilirubin: 0.7 mg/dL (ref <1.2)
Total Protein: 6.4 g/dL — ABNORMAL LOW (ref 6.5–8.1)

## 2023-05-31 LAB — CBC WITH DIFFERENTIAL/PLATELET
Abs Immature Granulocytes: 0.02 10*3/uL (ref 0.00–0.07)
Basophils Absolute: 0 10*3/uL (ref 0.0–0.1)
Basophils Relative: 0 %
Eosinophils Absolute: 0.1 10*3/uL (ref 0.0–0.5)
Eosinophils Relative: 1 %
HCT: 35.8 % — ABNORMAL LOW (ref 36.0–46.0)
Hemoglobin: 11.9 g/dL — ABNORMAL LOW (ref 12.0–15.0)
Immature Granulocytes: 0 %
Lymphocytes Relative: 28 %
Lymphs Abs: 1.8 10*3/uL (ref 0.7–4.0)
MCH: 28.5 pg (ref 26.0–34.0)
MCHC: 33.2 g/dL (ref 30.0–36.0)
MCV: 85.9 fL (ref 80.0–100.0)
Monocytes Absolute: 0.5 10*3/uL (ref 0.1–1.0)
Monocytes Relative: 8 %
Neutro Abs: 4 10*3/uL (ref 1.7–7.7)
Neutrophils Relative %: 63 %
Platelets: 166 10*3/uL (ref 150–400)
RBC: 4.17 MIL/uL (ref 3.87–5.11)
RDW: 13.6 % (ref 11.5–15.5)
WBC: 6.5 10*3/uL (ref 4.0–10.5)
nRBC: 0 % (ref 0.0–0.2)

## 2023-05-31 LAB — PREGNANCY, URINE: Preg Test, Ur: NEGATIVE

## 2023-05-31 NOTE — ED Notes (Signed)
While performing in and out pt became more alert and asked where she was. Attempted to orient pt by advising she was in [place], [place]. However, pt continued to think she was in [place], [place] and requested her mom.

## 2023-05-31 NOTE — Discharge Instructions (Signed)
Please follow-up with your primary care provider.  I have attached resource guide for local substance abuse counseling services.  If you develop life-threatening symptoms return to the emergency department.

## 2023-05-31 NOTE — ED Notes (Signed)
Pt ambulated around the unit with no issue. Pt was oriented x4.

## 2023-05-31 NOTE — ED Provider Notes (Signed)
[place] EMERGENCY DEPARTMENT AT [facility] Provider Note   CSN: [id number] Arrival date & time: 05/30/23  2226     History  Chief Complaint  Patient presents with   Cold Exposure    [name] is a 47 y.o. female.  Patient presents to the emergency department via EMS due to possible hypothermia.  EMS was reportedly requested by police due to patient sitting on the curb with her belongings.  EMS noted that the patient had a oral temperature of 95.7 degrees.  Patient was alert but would not talk to EMS.  Upon arrival at the emergency department patient continues to not participate in history.  She is alert at time of arrival and has a temperature at time of arrival of 94.6 degrees.  Past medical history significant for substance abuse, acute psychosis  Level 5 caveat due to patient's mental status  HPI     Home Medications Prior to Admission medications   Medication Sig Start Date End Date Taking? Authorizing Provider  nicotine (NICODERM CQ - DOSED IN MG/24 HOURS) 14 mg/24hr patch Place 1 patch (14 mg total) onto the skin daily. 05/11/23 06/10/23  Augusto Gamble, MD  nicotine polacrilex (NICORETTE) 2 MG gum Take 1 each (2 mg total) by mouth as needed for smoking cessation. 05/10/23 06/09/23  Augusto Gamble, MD  traZODone (DESYREL) 50 MG tablet Take 1 tablet (50 mg total) by mouth at bedtime as needed for sleep. 05/10/23 06/09/23  Augusto Gamble, MD  venlafaxine XR (EFFEXOR-XR) 37.5 MG 24 hr capsule Take 1 capsule (37.5 mg total) by mouth daily with breakfast. 05/11/23 06/10/23  Augusto Gamble, MD      Allergies    Patient has no known allergies.    Review of Systems   Review of Systems  Physical Exam Updated Vital Signs BP 101/67   Pulse 88   Temp 98.8 F (37.1 C)   Resp 18   LMP  (LMP Unknown)   SpO2 98%  Physical Exam Vitals and nursing note reviewed.  Constitutional:      General: She is not in acute distress.    Appearance: She is well-developed.  HENT:     Head:  Normocephalic and atraumatic.  Eyes:     Conjunctiva/sclera: Conjunctivae normal.     Pupils: Pupils are equal, round, and reactive to light.  Cardiovascular:     Rate and Rhythm: Normal rate and regular rhythm.  Pulmonary:     Effort: Pulmonary effort is normal. No respiratory distress.     Breath sounds: Normal breath sounds.  Abdominal:     Palpations: Abdomen is soft.  Musculoskeletal:        General: No swelling.     Cervical back: Neck supple.  Skin:    General: Skin is dry.     Capillary Refill: Capillary refill takes less than 2 seconds.  Neurological:     Mental Status: She is alert.     Comments: Patient alert but not participating in history  Psychiatric:        Mood and Affect: Mood normal.     ED Results / Procedures / Treatments   Labs (all labs ordered are listed, but only abnormal results are displayed) Labs Reviewed  CBC WITH DIFFERENTIAL/PLATELET - Abnormal; Notable for the following components:      Result Value   Hemoglobin 11.9 (*)    HCT 35.8 (*)    All other components within normal limits  COMPREHENSIVE METABOLIC PANEL - Abnormal; Notable for  the following components:   Glucose, Bld 110 (*)    Calcium 8.5 (*)    Total Protein 6.4 (*)    All other components within normal limits  RAPID URINE DRUG SCREEN, HOSP PERFORMED - Abnormal; Notable for the following components:   Cocaine POSITIVE (*)    Benzodiazepines POSITIVE (*)    Amphetamines POSITIVE (*)    Tetrahydrocannabinol POSITIVE (*)    All other components within normal limits  URINALYSIS, ROUTINE W REFLEX MICROSCOPIC  PREGNANCY, URINE  CBG MONITORING, ED    EKG None  Radiology No results found.  Procedures Procedures    Medications Ordered in ED Medications - No data to display  ED Course/ Medical Decision Making/ A&P                                 Medical Decision Making Amount and/or Complexity of Data Reviewed Labs: ordered.   This patient presents to the ED for  concern of possible hypothermia, this involves an extensive number of treatment options, and is a complaint that carries with it a high risk of complications and morbidity.     Co morbidities that complicate the patient evaluation  History of psychosis, substance abuse   Additional history obtained:  Additional history obtained from EMS External records from outside source obtained and reviewed including behavioral health urgent care notes and psychiatry notes from November 26 through December 3.  Patient admitted due to depression and suicidal ideations   Lab Tests:  I Ordered, and personally interpreted labs.  The pertinent results include: UDS positive for cocaine, benzodiazepines, amphetamines, THC.   Problem List / ED Course / Critical interventions / Medication management  Bear hugger for hypothermia I have reviewed the patients home medicines and have made adjustments as needed   Social Determinants of Health:  Patient is homeless   Test / Admission - Considered:  Patient initially altered.  As quarter pressure improved patient became more alert and oriented.  Patient was able to ambulate without difficulty and was eating and drinking without difficulty.  Patient now oriented.  Question if symptoms may be related to underlying drug use as well this patient stated that she was "clean" and urged a drug test to show the same.  UDS shows that patient was positive for multiple substances.  Patient's hypothermia likely secondary to drug use and homelessness.  No indication at this time for further emergent workup or admission.  Discharge with return precautions and resource guide for substance abuse treatment.         Final Clinical Impression(s) / ED Diagnoses Final diagnoses:  Accidental hypothermia, initial encounter  Substance abuse [facility])    Rx / DC Orders ED Discharge Orders     None         Pamala Duffel 05/31/23 0411    Glendora Score,  MD 05/31/23 [phone]

## 2023-06-14 ENCOUNTER — Ambulatory Visit (INDEPENDENT_AMBULATORY_CARE_PROVIDER_SITE_OTHER): Payer: PRIVATE HEALTH INSURANCE | Admitting: Licensed Clinical Social Worker

## 2023-06-14 ENCOUNTER — Ambulatory Visit (HOSPITAL_COMMUNITY)
Admission: EM | Admit: 2023-06-14 | Discharge: 2023-06-15 | Disposition: A | Discharge status: Home / Self Care | Payer: 59 | Attending: Psychiatry | Admitting: Psychiatry

## 2023-06-14 ENCOUNTER — Encounter (HOSPITAL_COMMUNITY): Payer: Self-pay

## 2023-06-14 DIAGNOSIS — F339 Major depressive disorder, recurrent, unspecified: Secondary | ICD-10-CM | POA: Insufficient documentation

## 2023-06-14 DIAGNOSIS — F1220 Cannabis dependence, uncomplicated: Secondary | ICD-10-CM

## 2023-06-14 DIAGNOSIS — Z560 Unemployment, unspecified: Secondary | ICD-10-CM | POA: Diagnosis not present

## 2023-06-14 DIAGNOSIS — F1420 Cocaine dependence, uncomplicated: Secondary | ICD-10-CM

## 2023-06-14 DIAGNOSIS — F1520 Other stimulant dependence, uncomplicated: Secondary | ICD-10-CM | POA: Diagnosis not present

## 2023-06-14 DIAGNOSIS — F1410 Cocaine abuse, uncomplicated: Secondary | ICD-10-CM | POA: Insufficient documentation

## 2023-06-14 DIAGNOSIS — F4310 Post-traumatic stress disorder, unspecified: Secondary | ICD-10-CM

## 2023-06-14 DIAGNOSIS — F1120 Opioid dependence, uncomplicated: Secondary | ICD-10-CM | POA: Diagnosis not present

## 2023-06-14 DIAGNOSIS — F1210 Cannabis abuse, uncomplicated: Secondary | ICD-10-CM | POA: Diagnosis not present

## 2023-06-14 DIAGNOSIS — F1310 Sedative, hypnotic or anxiolytic abuse, uncomplicated: Secondary | ICD-10-CM | POA: Insufficient documentation

## 2023-06-14 DIAGNOSIS — F1910 Other psychoactive substance abuse, uncomplicated: Secondary | ICD-10-CM | POA: Diagnosis present

## 2023-06-14 DIAGNOSIS — F1121 Opioid dependence, in remission: Secondary | ICD-10-CM

## 2023-06-14 LAB — POCT URINE DRUG SCREEN - MANUAL ENTRY (I-SCREEN)
POC Amphetamine UR: NOT DETECTED
POC Buprenorphine (BUP): POSITIVE — AB
POC Cocaine UR: POSITIVE — AB
POC Marijuana UR: POSITIVE — AB
POC Methadone UR: NOT DETECTED
POC Methamphetamine UR: POSITIVE — AB
POC Morphine: NOT DETECTED
POC Oxazepam (BZO): POSITIVE — AB
POC Oxycodone UR: POSITIVE — AB
POC Secobarbital (BAR): NOT DETECTED

## 2023-06-14 LAB — POC URINE PREG, ED: Preg Test, Ur: NEGATIVE

## 2023-06-14 MED ORDER — ACETAMINOPHEN 325 MG PO TABS: 650.0000 mg | ORAL_TABLET | Freq: Four times a day (QID) | ORAL | Status: DC | PRN

## 2023-06-14 MED ORDER — HYDROXYZINE HCL 25 MG PO TABS: 25.0000 mg | ORAL_TABLET | Freq: Four times a day (QID) | ORAL | Status: DC | PRN

## 2023-06-14 MED ORDER — NAPROXEN 500 MG PO TABS: 500.0000 mg | ORAL_TABLET | Freq: Two times a day (BID) | ORAL | Status: DC | PRN

## 2023-06-14 MED ORDER — LOPERAMIDE HCL 2 MG PO CAPS: 2.0000 mg | ORAL_CAPSULE | ORAL | Status: DC | PRN

## 2023-06-14 MED ORDER — ONDANSETRON 4 MG PO TBDP: 4.0000 mg | ORAL_TABLET | Freq: Four times a day (QID) | ORAL | Status: DC | PRN

## 2023-06-14 MED ORDER — DICYCLOMINE HCL 20 MG PO TABS: 20.0000 mg | ORAL_TABLET | Freq: Four times a day (QID) | ORAL | Status: DC | PRN

## 2023-06-14 MED ORDER — BUPRENORPHINE HCL-NALOXONE HCL 8-2 MG SL SUBL
1.0000 | SUBLINGUAL_TABLET | Freq: Once | SUBLINGUAL | Status: AC
  Administered 2023-06-14: 1 via SUBLINGUAL
  Filled 2023-06-14: qty 1

## 2023-06-14 MED ORDER — ZIPRASIDONE MESYLATE 20 MG IM SOLR: 20.0000 mg | INTRAMUSCULAR | Status: DC | PRN

## 2023-06-14 MED ORDER — OLANZAPINE 5 MG PO TBDP: 5.0000 mg | ORAL_TABLET | Freq: Three times a day (TID) | ORAL | Status: DC | PRN

## 2023-06-14 MED ORDER — MAGNESIUM HYDROXIDE 400 MG/5ML PO SUSP: 30.0000 mL | Freq: Every day | ORAL | Status: DC | PRN

## 2023-06-14 MED ORDER — METHOCARBAMOL 500 MG PO TABS: 500.0000 mg | ORAL_TABLET | Freq: Three times a day (TID) | ORAL | Status: DC | PRN

## 2023-06-14 MED ORDER — ALUM & MAG HYDROXIDE-SIMETH 200-200-20 MG/5ML PO SUSP: 30.0000 mL | ORAL | Status: DC | PRN

## 2023-06-14 NOTE — BH Assessment (Signed)
 Comprehensive Clinical Assessment (CCA) Note  06/14/2023 Hailey Perez [id number]  Disposition: Gaither Pouch, NP recommends pt to be admitted to [facility] for observation.   The patient demonstrates the following risk factors for suicide: Chronic risk factors for suicide include: psychiatric disorder of Major Depressive Disorder, recurrent moderate and substance use disorder. Acute risk factors for suicide include: N/A. Protective factors for this patient include: positive therapeutic relationship. Considering these factors, the overall suicide risk at this point appears to be no risk. Patient is not appropriate for outpatient follow up.  Hailey Perez is a 48 year old female who presents voluntary and unaccompanied to [facility] ([facility]). Clinician asked the pt, what brought you to the hospital? Pt reports, she took a Xanax yesterday because her blood pressure was high. Per pt, she seen her therapist today, who recommend she to come in to detox off Xanax and make sure she doesn't have a seizure. Pt reports, if she can detox for a day or two then she will be able to start the [facility] program. Pt denies, SI, HI, hallucination, self-injurious behaviors and access to weapons.   Pt reports, her UDS is positive for Methamphetamines. Pt's UDS was also positive for Buprenorphine , Oxazepam, Cocaine and Marijuana. Pt reports, she thinks the Cocaine she used had Meth in it. Pt reports, she took a lot of Xanax at Christmas, went to the ED and received treatment for hypothermia. Pt is linked to Hailey Maier, LCSW for therapy and [facility] for medication management. Pt reports, she's prescribed Suboxone  8 mg three times daily and Prozac , Pt reports, she was at a facility in [place], [place] for about a week and half. Pt reports, while there she was taking off all medications which made her sick and she slept most of the time. Per pt, she was kicked out  with only her shoes, she had to get her belongings. Pt reports, she was homeless in [place].   Pt presents alert in casual attire with normal speech. Pt's mood, affect was anxious. Pt's insight was fair. Pt's judgement was fair.   Chief Complaint:  Chief Complaint  Patient presents with   Addiction Problem   Visit Diagnosis: Major Depressive Disorder, recurrent moderate.                              Cocaine use Disorder, severe.     CCA Screening, Triage and Referral (STR)  Patient Reported Information How did you hear about us ? Self  What Is the Reason for Your Visit/Call Today? Pt presents to [facility] as a voluntary walk-in, requesting substance abuse treatment/detox. Pt reports using Xanax and cocaine and she last used yesterday morning. Prior to yesterday, pt reports she had not used Xanax in about 2 weeks. Pt has history of depression and anxiety. Pt is established with outpatient therapy with Hailey Perez at [facility] outpatient. Pt currently denies SI,HI,AVH.  How Long Has This Been Causing You Problems? <Week  What Do You Feel Would Help You the Most Today? Alcohol  or Drug Use Treatment   Have You Recently Had Any Thoughts About Hurting Yourself? No  Are You Planning to Commit Suicide/Harm Yourself At This time? No   Flowsheet Row ED from 06/14/2023 in [facility] Most recent reading at 06/14/2023  9:55 PM Ad[facility] (Dis[facility]) [facility] Most recent reading at  05/03/2023 10:12 PM ED from 05/03/2023 in [facility] Most recent reading at 05/03/2023  7:26 PM  C-SSRS RISK CATEGORY No Risk Low Risk No Risk       Have you Recently Had Thoughts About Hurting Someone Hailey Perez? No  Are You Planning to Harm Someone at This Time? No  Explanation: None.   Have You Used Any Alcohol  or Drugs in the Past 24 Hours? Yes  What Did You Use and How Much?  Xanax   Do You Currently Have a Therapist/Psychiatrist? No  Name of Therapist/Psychiatrist: Name of Therapist/Psychiatrist: Pt is linked to Hailey Maier, LCSW for therapy and [facility] for medication management.   Have You Been Recently Discharged From Any Office Practice or Programs? Yes  Explanation of Discharge From Practice/Program: Pt reports, she was kicked out of a facility in [place]     CCA Screening Triage Referral Assessment Type of Contact: Face-to-Face  Telemedicine Service Delivery:   Is this Initial or Reassessment?   Date Telepsych consult ordered in [facility]:    Time Telepsych consult ordered in [facility]:    Location of Assessment: [facility] Assessment Services  Provider Location: [facility] Assessment Services   Collateral Involvement: None.   Does Patient Have a Automotive Engineer Guardian? No  Legal Guardian Contact Information: Pt is her own guardian.  Copy of Legal Guardianship Form: -- (Pt is her own guardian.)  Legal Guardian Notified of Arrival: -- (Pt is her own guardian.)  Legal Guardian Notified of Pending Discharge: -- (Pt is her own guardian.)  If Minor and Not Living with Parent(s), Who has Custody? Pt is an adult.  Is CPS involved or ever been involved? Never  Is APS involved or ever been involved? Never   Patient Determined To Be At Risk for Harm To Self or Others Based on Review of Patient Reported Information or Presenting Complaint? No  Method: No Plan  Availability of Means: No access or NA  Intent: Vague intent or NA  Notification Required: No need or identified person  Additional Information for Danger to Others Potential: -- (None.)  Additional Comments for Danger to Others Potential: None.  Are There Guns or Other Weapons in Your Home? No  Types of Guns/Weapons: Pt denies, access to weapons.  Are These Weapons Safely Secured?                            -- (Pt denies, access to weapons.)  Who Could Verify You Are Able To Have These  Secured: Pt denies, access to weapons.  Do You Have any Outstanding Charges, Pending Court Dates, Parole/Probation? Pt denies.  Contacted To Inform of Risk of Harm To Self or Others: Other: Comment (None.)    Does Patient Present under Involuntary Commitment? No    [place] of Residence: [place]   Patient Currently Receiving the Following Services: Individual Therapy   Determination of Need: Routine (7 days)   Options For Referral: Other: Comment; Chemical Dependency Intensive Outpatient Therapy (CDIOP); Facility-Based Crisis     CCA Biopsychosocial Patient Reported Schizophrenia/Schizoaffective Diagnosis in Past: No   Strengths: Pt wants to get help.   Mental Health Symptoms Depression:  Difficulty Concentrating; Hopelessness; Worthlessness; Fatigue (Isolation.)   Duration of Depressive symptoms: Duration of Depressive Symptoms: Less than two weeks   Mania:  None   Anxiety:   Worrying; Difficulty concentrating; Fatigue; Restlessness   Psychosis:  None   Duration of Psychotic symptoms: Duration of  Psychotic Symptoms: N/A   Trauma:  None   Obsessions:  None   Compulsions:  None   Inattention:  Disorganized; Forgetful; Loses things   Hyperactivity/Impulsivity:  Fidgets with hands/feet   Oppositional/Defiant Behaviors:  None   Emotional Irregularity:  None   Other Mood/Personality Symptoms:  None.    Mental Status Exam Appearance and self-care  Stature:  Average   Weight:  Average weight   Clothing:  Casual   Grooming:  Normal   Cosmetic use:  None   Posture/gait:  Normal   Motor activity:  Not Remarkable   Sensorium  Attention:  Normal   Concentration:  Normal   Orientation:  X5   Recall/memory:  Normal   Affect and Mood  Affect:  Anxious   Mood:  Anxious   Relating  Eye contact:  Normal   Facial expression:  Responsive   Attitude toward examiner:  Cooperative   Thought and Language  Speech flow: Normal   Thought  content:  Appropriate to Mood and Circumstances   Preoccupation:  None   Hallucinations:  None   Organization:  Coherent   [employer] of Knowledge:  Fair   Intelligence:  Average   Abstraction:  Functional   Judgement:  Fair   Dance Movement Psychotherapist:  Adequate   Insight:  Fair   Decision Making:  Impulsive   Social Functioning  Social Maturity:  Isolates   Social Judgement:  Chief Of Staff   Stress  Stressors:  Other (Comment) (Pt reports, getting her life back on track.)   Coping Ability:  Overwhelmed   Skill Deficits:  Decision making   Supports:  Family     Religion: Religion/Spirituality Are You A Religious Person?: Yes What is Your Religious Affiliation?: Christian How Might This Affect Treatment?: None.  Leisure/Recreation: Leisure / Recreation Do You Have Hobbies?: Yes Leisure and Hobbies: Pt reports, playing with grandson (when she can.)  Exercise/Diet: Exercise/Diet Do You Exercise?: Yes What Type of Exercise Do You Do?: Run/Walk How Many Times a Week Do You Exercise?: Daily Have You Gained or Lost A Significant Amount of Weight in the Past Six Months?: No Do You Follow a Special Diet?: No Do You Have Any Trouble Sleeping?: No   CCA Employment/Education Employment/Work Situation: Employment / Work Situation Employment Situation: Unemployed Patient's Job has Been Impacted by Current Illness: No Has Patient ever Been in Equities Trader?: No  Education: Education Is Patient Currently Attending School?: No Last Grade Completed: 10 Did You Product Manager?: No Did You Have An Individualized Education Program (IIEP): No Did You Have Any Difficulty At School?: No Patient's Education Has Been Impacted by Current Illness: No   CCA Family/Childhood History Family and Relationship History: Family history Marital status: Single Does patient have children?: Yes How many children?: 2 How is patient's relationship with their children?:  Pt reports, her two children are 19 and 24. Pt reports, getting along with her children to a certain extent. Pt reports, she has a three year old grandson.  Childhood History:  Childhood History By whom was/is the patient raised?: Mother, Grandparents Description of patient's current relationship with siblings: Unsure. Did patient suffer any verbal/emotional/physical/sexual abuse as a child?: No Did patient suffer from severe childhood neglect?: No Has patient ever been sexually abused/assaulted/raped as an adolescent or adult?: No Type of abuse, by whom, and at what age: Pt denies. Was the patient ever a victim of a crime or a disaster?: No How has this affected patient's relationships?: Pt denies.  Spoken with a professional about abuse?: No Does patient feel these issues are resolved?: No Witnessed domestic violence?: No Has patient been affected by domestic violence as an adult?: No   CCA Substance Use Alcohol /Drug Use: Alcohol  / Drug Use Pain Medications: See MAR Prescriptions: See MAR Over the Counter: See MAR History of alcohol  / drug use?: Yes Longest period of sobriety (when/how long): Pt reports, 75 days when she was in jail. Negative Consequences of Use: Financial, Legal, Personal relationships, Work / School Withdrawal Symptoms: None Substance #1 Name of Substance 1: Cocaine. 1 - Age of First Use: 27 1 - Amount (size/oz): Pt reports, using a little bit yesterday. 1 - Frequency: Pt reports, she's not used in 2-3 weeks. 1 - Duration: Ongoing. 1 - Last Use / Amount: Yesterday (06/13/2023). 1 - Method of Aquiring: Purchase. 1- Route of Use: Unsure. Substance #2 Name of Substance 2: Xanax. 2 - Age of First Use: 20. 2 - Amount (size/oz): Pt reports, she took a peach yesterday. Pt reports, she used to take blue bars. 2 - Frequency: Pt reports, she's not used in a month. 2 - Duration: Ongoing. 2 - Last Use / Amount: Yesterday (06/13/2023). 2 - Method of Aquiring:  Purchase. 2 - Route of Substance Use: Unsure.    ASAM's:  Six Dimensions of Multidimensional Assessment  Dimension 1:  Acute Intoxication and/or Withdrawal Potential:   Dimension 1:  Description of individual's past and current experiences of substance use and withdrawal: None.  Dimension 2:  Biomedical Conditions and Complications:   Dimension 2:  Description of patient's biomedical conditions and  complications: None.  Dimension 3:  Emotional, Behavioral, or Cognitive Conditions and Complications:  Dimension 3:  Description of emotional, behavioral, or cognitive conditions and complications: Depression, anxiety.  Dimension 4:  Readiness to Change:  Dimension 4:  Description of Readiness to Change criteria: Pt reports, she met with her therapist Land, LCSW) today who recommended she come in for detox then be linked to a SAIOP program.  Dimension 5:  Relapse, Continued use, or Continued Problem Potential:  Dimension 5:  Relapse, continued use, or continued problem potential critiera description: Per pt, she's decreased her usage.  Dimension 6:  Recovery/Living Environment:  Dimension 6:  Recovery/Iiving environment criteria description: Per chart, environment is not supportive- Homeless with no family support.  ASAM Severity Score: ASAM's Severity Rating Score: 11  ASAM Recommended Level of Treatment: ASAM Recommended Level of Treatment: Level III Residential Treatment   Substance use Disorder (SUD) Substance Use Disorder (SUD)  Checklist Symptoms of Substance Use: Continued use despite having a persistent/recurrent physical/psychological problem caused/exacerbated by use, Continued use despite persistent or recurrent social, interpersonal problems, caused or exacerbated by use  Recommendations for Services/Supports/Treatments: Recommendations for Services/Supports/Treatments Recommendations For Services/Supports/Treatments: CD-IOP Intensive Chemical Dependency Program,  Residential-Level 3, Other (Comment) ([facility] for observation.)  Disposition Recommendation per psychiatric provider: We recommend transfer to [facility].   DSM5 Diagnoses: Patient Active Problem List   Diagnosis Date Noted   MDD (major depressive disorder) 05/03/2023   Acute psychosis (HCC) 12/07/2022   Abscess of forearm, right 12/12/2014     Referrals to Alternative Service(s): Referred to Alternative Service(s):   Place:   Date:   Time:    Referred to Alternative Service(s):   Place:   Date:   Time:    Referred to Alternative Service(s):   Place:   Date:   Time:    Referred to Alternative Service(s):   Place:  Date:   Time:     Jackson JONETTA Broach, [facility] Comprehensive Clinical Assessment (CCA) Screening, Triage and Referral Note  06/14/2023 Hailey Perez [id number]  Chief Complaint:  Chief Complaint  Patient presents with   Addiction Problem   Visit Diagnosis:   Patient Reported Information How did you hear about us ? Self  What Is the Reason for Your Visit/Call Today? Pt presents to [facility] as a voluntary walk-in, requesting substance abuse treatment/detox. Pt reports using Xanax and cocaine and she last used yesterday morning. Prior to yesterday, pt reports she had not used Xanax in about 2 weeks. Pt has history of depression and anxiety. Pt is established with outpatient therapy with Hailey Perez at [facility] outpatient. Pt currently denies SI,HI,AVH.  How Long Has This Been Causing You Problems? <Week  What Do You Feel Would Help You the Most Today? Alcohol  or Drug Use Treatment   Have You Recently Had Any Thoughts About Hurting Yourself? No  Are You Planning to Commit Suicide/Harm Yourself At This time? No   Have you Recently Had Thoughts About Hurting Someone Hailey Perez? No  Are You Planning to Harm Someone at This Time? No  Explanation: None.   Have You Used Any Alcohol  or Drugs in the Past 24 Hours? Yes  How Long Ago  Did You Use Drugs or Alcohol ? Yesterday, (06/13/2023) What Did You Use and How Much? Xanax   Do You Currently Have a Therapist/Psychiatrist? No  Name of Therapist/Psychiatrist: Pt is linked to Hailey Maier, LCSW for therapy and [facility] for medication management.   Have You Been Recently Discharged From Any Office Practice or Programs? Yes  Explanation of Discharge From Practice/Program: Pt reports, she was kicked out of a facility in [place]    CCA Screening Triage Referral Assessment Type of Contact: Face-to-Face  Telemedicine Service Delivery:   Is this Initial or Reassessment?   Date Telepsych consult ordered in [facility]:    Time Telepsych consult ordered in [facility]:    Location of Assessment: [facility] Assessment Services  Provider Location: [facility] Assessment Services    Collateral Involvement: None.   Does Patient Have a Automotive Engineer Guardian? No. Name and Contact of Legal Guardian: Pt is her own guardian.  If Minor and Not Living with Parent(s), Who has Custody? Pt is an adult.  Is CPS involved or ever been involved? Never  Is APS involved or ever been involved? Never   Patient Determined To Be At Risk for Harm To Self or Others Based on Review of Patient Reported Information or Presenting Complaint? No  Method: No Plan  Availability of Means: No access or NA  Intent: Vague intent or NA  Notification Required: No need or identified person  Additional Information for Danger to Others Potential: -- (None.)  Additional Comments for Danger to Others Potential: None.  Are There Guns or Other Weapons in Your Home? No  Types of Guns/Weapons: Pt denies, access to weapons.  Are These Weapons Safely Secured?                            -- (Pt denies, access to weapons.)  Who Could Verify You Are Able To Have These Secured: Pt denies, access to weapons.  Do You Have any Outstanding Charges, Pending Court Dates, Parole/Probation? Pt denies.  Contacted To Inform of Risk  of Harm To Self or Others: Other: Comment (None.)   Does Patient Present under Involuntary Commitment? No  [place] of Residence: [place]   Patient Currently Receiving the Following Services: Individual Therapy   Determination of Need: Routine (7 days)   Options For Referral: Other: Comment; Chemical Dependency Intensive Outpatient Therapy (CDIOP); Facility-Based Crisis   Disposition Recommendation per psychiatric provider: We recommend transfer to [facility].  Jackson JONETTA Broach, LCMHC     Donathan Buller D Kary Sugrue, MS, [facility], [facility] Triage Specialist [phone]

## 2023-06-14 NOTE — Progress Notes (Signed)
 THERAPIST PROGRESS NOTE   Session Time: 1 p.m. to 3 p.m.    Type of Therapy: Individual    Therapist Response/Interventions: Solution Focused/The therapist explains to Hailey Perez that she should not have her anxiety treated with a benzodiazepine talking to her about rebound anxiety and the fact that she apparently was taking so much Xanax that she would black out and use other substances as well.   The therapist talks with her about treatment options and recommends she go to detox and then SA IOP with Twelve Step meeting attendance; and if this does not lead to sobriety that a higher level of care will need to be considered.    Treatment Goals addressed:  Active     Substance Use      Hailey Perez will remain sober from drugs and alcohol  on a daily basis while building sober supports per self-report and random UDS.  (Initial)     Start:  06/14/23    Expected End:  12/12/23         Hailey Perez will report a decrease in her depression and anxiety as evidenced by her PHQ-9 and GAD-7 both being a 4 or less.  (Initial)     Start:  06/14/23    Expected End:  12/12/23         The therapist will assist Hailey Perez in being able to identify and avoid triggers for using substances.      Start:  06/14/23         The therapist will assist Hailey Perez in identifying and changing thoughts and behaviors that contribute to her depression and anxiety.      Start:  06/14/23      Hailey Perez gives verbal permission for this therapist to electronically sign this care plan on her behalf.           Summary: Hailey Perez presents today accompanied by her mother. She last met with this therapist in November of 2024 after which she went to [facility]. She was reportedly kicked out of [facility] as she became belligerent with staff as she was upset that they took her Prozac  away and were giving her a medication that she believes made her sleep to much.   During today's meeting, Hailey Perez is alert,  well-groomed, and her affect is appropriate which is in stark contrast to her previous meetings with this therapist. Additionally, she denies hearing any voices and no longer has the delusion regarding being under constant video surveillance which this therapist suspects was substance-induced.  She reports having no recall for having been admitted for the treatment of hypothermia at [facility] on 05/31/23 at which time she tested positive for a number of substances.   She says that she was using Xanax when staying at a hotel on Xmas Eve claiming to have used only twice since then taking two peach-colored Xanax on two occasions with one being within the last few days. She says that she last used cocaine a few days ago and is initially reluctant to take a UDS today. She eventually takes a UDS and is positive for THC, Meth, Cocaine, and benzodiazepines. She admits to smoking pot recently and using cocaine this morning saying that she does not know how she tested positive for the Meth. Her BP is initially 141/105 and pulse is a 72; however, when it is taken again, it is 143/80 with her pulse is 68.  The therapist, Hailey Perez, and her mother discuss treatment options. The therapist recommends that Hailey Perez go to  detox at the [facility] to be on the safe side after which she can start SA IOP as is her desire. She does not want to go to a sober living like an [address]; however, her mom says that her uncle, with whom they live, is going to take out papers to have her evicted if she starts  using and exhibits the combative and paranoid behaviors she has displayed in the past. The therapist suggests that a problem with her living situation is a cousin who smokes pot who lives on the property and with whom Hailey Perez tends to hang out.   The therapist discusses the need for Hailey Perez to avoid people, places, and things and to start attending meetings with her mom agreeing to attend open meetings with her so as to support her.      Progress Towards Goals: Initial   Suicidal/Homicidal: No SI or HI   Plan: Will go directly to inpatient detox at the [facility].   Diagnosis: Cannabis use disorder, severe, dependence; cocaine use disorder, severe, dependence; severe opioid use disorder, in early remission, on maintenance therapy; psychoactive substance induced psychosis; and posttraumatic stress disorder   Collaboration of Care: Other N/A   Patient/Guardian was advised Release of Information must be obtained prior to any record release in order to collaborate their care with an outside provider. Patient/Guardian was advised if they have not already done so to contact the registration department to sign all necessary forms in order for us  to release information regarding their care.    Consent: Patient/Guardian gives verbal consent for treatment and assignment of benefits for services provided during this visit. Patient/Guardian expressed understanding and agreed to proceed.    Hailey Maier, MA, LCSW, [facility], LCAS 06/14/2023

## 2023-06-14 NOTE — Progress Notes (Signed)
   06/14/23 1922  [facility] Triage Screening (Walk-ins at [facility] only)  What Is the Reason for Your Visit/Call Today? Pt presents to [facility] as a voluntary walk-in, requesting substance abuse treatment/detox. Pt reports using Xanax and cocaine and she last used yesterday morning. Prior to yesterday, pt reports she had not used Xanax in about 2 weeks. Pt has history of depression and anxiety. Pt is established with outpatient therapy with Zell Console at [facility] outpatient. Pt currently denies SI,HI,AVH.  How Long Has This Been Causing You Problems? <Week  Have You Recently Had Any Thoughts About Hurting Yourself? No  How long ago did you have thoughts about hurting yourself? pt denies  Are You Planning to Commit Suicide/Harm Yourself At This time? No  Have you Recently Had Thoughts About Hurting Someone [name]? No  Are You Planning To Harm Someone At This Time? No  Physical Abuse Denies  Verbal Abuse Denies  Sexual Abuse Denies  Exploitation of patient/patient's resources Denies  Self-Neglect Denies  Are you currently experiencing any auditory, visual or other hallucinations? No  Please explain the hallucinations you are currently experiencing: pt denies  Have You Used Any Alcohol  or Drugs in the Past 24 Hours? Yes  How long ago did you use Drugs or Alcohol ? yesterday  What Did You Use and How Much? Xanax  Do you have any current medical co-morbidities that require immediate attention? No  Clinician description of patient physical appearance/behavior: Pt is cooperative, casually dresssed, calm and oriented  What Do You Feel Would Help You the Most Today? Alcohol  or Drug Use Treatment  If access to [facility] was not available, would you have sought care in the Emergency Department? No  Determination of Need Routine (7 days)  Options For Referral Facility-Based Crisis;Other: Comment;Chemical Dependency Intensive Outpatient Therapy (CDIOP)

## 2023-06-14 NOTE — ED Notes (Signed)
 Patient A&Ox4. Patient denies SI/HI and AVH. Patient oriented to unit. Denies A/VH. Patient denies any physical complaints when asked. No acute distress noted. Support and encouragement provided. Routine safety checks conducted according to facility protocol. Encouraged patient to notify staff if thoughts of harm toward self or others arise. Patient verbalize understanding and agreement. Will continue to monitor for safety.

## 2023-06-14 NOTE — ED Provider Notes (Signed)
 [facility] Continuous Assessment Admission H&P  Date: 06/15/23 Patient Name: Hailey Perez MRN: [id number] Chief Complaint: need rehab from xanna and cocaine  Diagnoses:  Final diagnoses:  Polysubstance abuse (HCC)  Recurrent major depressive disorder, remission status unspecified (HCC)    HPI: Hailey Perez, 48 y/o female with a history of polysubstance abuse (Xanax, cocaine, and marijuana), MDD, acute psychosis.  Presented to [facility] voluntarily seeking rehab from Xanax use.  According to the patient she went to her therapist today and the therapist told her to come here to get rehab from Xanax and cocaine.  According to the patient she last took Xanax yesterday and cocaine patient also reports she smoked marijuana daily.  Patient stated that she has calm down a little because she used to do heavy drugs.  According to the patient she lives with her mother, currently unemployed.  I review of patient records show multiple visits for similar situation patient was also admitted to [facility] back in November 2024.  Patient also stated that she is currently taking Suboxone  4/2 mg   Face-to-face evaluation of patient, patient is alert and oriented x 4, speech is clear, maintain eye contact.  Patient is well groomed, affect is pleasant.  Patient denies SI, HI, AVH or paranoia at this time.  Patient denies alcohol  use.  Patient reports she used Xanax, cocaine, and marijuana as is recent does today and yesterday.  Patient denies wanting to hurt others or herself denies access to guns.  According to patient she is trying to get into a program but she has to come here first before they will take her.    Recommend inpatient observation   Total Time spent with patient: 20 minutes  Musculoskeletal  Strength & Muscle Tone: within normal limits Gait & Station: normal Patient leans: Right  Psychiatric Specialty Exam  Presentation General Appearance:  Casual  Eye Contact: Good  Speech: Clear and  Coherent  Speech Volume: Normal  Handedness: Right   Mood and Affect  Mood: Anxious  Affect: Appropriate   Thought Process  Thought Processes: Coherent  Descriptions of Associations:Circumstantial  Orientation:Full (Time, Place and Person)  Thought Content:Logical  Diagnosis of Schizophrenia or Schizoaffective disorder in past: No   Hallucinations:Hallucinations: None  Ideas of Reference:None  Suicidal Thoughts:Suicidal Thoughts: No  Homicidal Thoughts:Homicidal Thoughts: No   Sensorium  Memory: Immediate Fair  Judgment: Fair  Insight: Fair   Art Therapist  Concentration: Fair  Attention Span: Fair  Recall: Good  Fund of Knowledge: Good  Language: Good   Psychomotor Activity  Psychomotor Activity: Psychomotor Activity: Normal   Assets  Assets: Desire for Improvement; Resilience; Vocational/Educational   Sleep  Sleep: Sleep: Fair Number of Hours of Sleep: 7   Nutritional Assessment (For OBS and FBC admissions only) Has the patient had a weight loss or gain of 10 pounds or more in the last 3 months?: No Has the patient had a decrease in food intake/or appetite?: No Does the patient have dental problems?: No Does the patient have eating habits or behaviors that may be indicators of an eating disorder including binging or inducing vomiting?: No Has the patient recently lost weight without trying?: 0 Has the patient been eating poorly because of a decreased appetite?: 0 Malnutrition Screening Tool Score: 0    Physical Exam HENT:     Head: Normocephalic.     Nose: Nose normal.  Eyes:     Pupils: Pupils are equal, round, and reactive to light.  Cardiovascular:  Rate and Rhythm: Normal rate.  Pulmonary:     Effort: Pulmonary effort is normal.  Musculoskeletal:        General: Normal range of motion.     Cervical back: Normal range of motion.  Neurological:     General: No focal deficit present.     Mental  Status: She is alert.  Psychiatric:        Mood and Affect: Mood normal.        Behavior: Behavior normal.        Thought Content: Thought content normal.        Judgment: Judgment normal.    Review of Systems  Constitutional: Negative.   HENT: Negative.    Eyes: Negative.   Respiratory: Negative.    Cardiovascular: Negative.   Gastrointestinal: Negative.   Genitourinary: Negative.   Musculoskeletal: Negative.   Skin: Negative.   Neurological: Negative.   Psychiatric/Behavioral:  Positive for substance abuse. The patient is nervous/anxious.     Blood pressure 129/83, pulse 76, temperature 97.7 F (36.5 C), temperature source Oral, resp. rate 20, SpO2 98%. There is no height or weight on file to calculate BMI.  Past Psychiatric History: cocaine,  abuse, xannax abuse,  marijuana.  Opioid abuse   Is the patient at risk to self? No  Has the patient been a risk to self in the past 6 months? No .    Has the patient been a risk to self within the distant past? No   Is the patient a risk to others? No   Has the patient been a risk to others in the past 6 months? No   Has the patient been a risk to others within the distant past? No   Past Medical History: see chart   Family History: see chart   Social History: cocaine abuse, marijuana,  opioid   Last Labs:  Admission on 06/14/2023  Component Date Value Ref Range Status   WBC 06/14/2023 5.9  4.0 - 10.5 K/uL Final   RBC 06/14/2023 4.34  3.87 - 5.11 MIL/uL Final   Hemoglobin 06/14/2023 12.2  12.0 - 15.0 g/dL Final   HCT 98/92/7974 36.8  36.0 - 46.0 % Final   MCV 06/14/2023 84.8  80.0 - 100.0 fL Final   MCH 06/14/2023 28.1  26.0 - 34.0 pg Final   MCHC 06/14/2023 33.2  30.0 - 36.0 g/dL Final   RDW 98/92/7974 13.2  11.5 - 15.5 % Final   Platelets 06/14/2023 194  150 - 400 K/uL Final   nRBC 06/14/2023 0.0  0.0 - 0.2 % Final   Neutrophils Relative % 06/14/2023 54  % Final   Neutro Abs 06/14/2023 3.1  1.7 - 7.7 K/uL Final    Lymphocytes Relative 06/14/2023 37  % Final   Lymphs Abs 06/14/2023 2.2  0.7 - 4.0 K/uL Final   Monocytes Relative 06/14/2023 7  % Final   Monocytes Absolute 06/14/2023 0.4  0.1 - 1.0 K/uL Final   Eosinophils Relative 06/14/2023 1  % Final   Eosinophils Absolute 06/14/2023 0.1  0.0 - 0.5 K/uL Final   Basophils Relative 06/14/2023 1  % Final   Basophils Absolute 06/14/2023 0.0  0.0 - 0.1 K/uL Final   Immature Granulocytes 06/14/2023 0  % Final   Abs Immature Granulocytes 06/14/2023 0.02  0.00 - 0.07 K/uL Final   Performed at [facility], [address] [place], [place] [address]   Sodium 06/14/2023 138  135 - 145 mmol/L Final  Potassium 06/14/2023 4.1  3.5 - 5.1 mmol/L Final   Chloride 06/14/2023 103  98 - 111 mmol/L Final   CO2 06/14/2023 27  22 - 32 mmol/L Final   Glucose, Bld 06/14/2023 92  70 - 99 mg/dL Final   Glucose reference range applies only to samples taken after fasting for at least 8 hours.   BUN 06/14/2023 9  6 - 20 mg/dL Final   Creatinine, Ser 06/14/2023 0.64  0.44 - 1.00 mg/dL Final   Calcium  06/14/2023 8.8 (L)  8.9 - 10.3 mg/dL Final   Total Protein 98/92/7974 5.9 (L)  6.5 - 8.1 g/dL Final   Albumin 98/92/7974 3.2 (L)  3.5 - 5.0 g/dL Final   AST 98/92/7974 16  15 - 41 U/L Final   ALT 06/14/2023 14  0 - 44 U/L Final   Alkaline Phosphatase 06/14/2023 61  38 - 126 U/L Final   Total Bilirubin 06/14/2023 0.4  0.0 - 1.2 mg/dL Final   GFR, Estimated 06/14/2023 >60  >60 mL/min Final   Comment: (NOTE) Calculated using the CKD-EPI Creatinine Equation (2021)    Anion gap 06/14/2023 8  5 - 15 Final   Performed at [facility], [address] [place], [place] [address]   Alcohol , Ethyl (B) 06/14/2023 <10  <10 mg/dL Final   Comment: (NOTE) Lowest detectable limit for serum alcohol  is 10 mg/dL.  For medical purposes only. Performed at [facility], [address] [place], [place] [address]    Cholesterol 06/14/2023 211 (H)  0 - 200 mg/dL Final    Triglycerides 06/14/2023 256 (H)  <150 mg/dL Final   HDL 98/92/7974 49  >40 mg/dL Final   Total CHOL/HDL Ratio 06/14/2023 4.3  RATIO Final   VLDL 06/14/2023 51 (H)  0 - 40 mg/dL Final   LDL Cholesterol 06/14/2023 111 (H)  0 - 99 mg/dL Final   Comment:        Total Cholesterol/HDL:CHD Risk Coronary Heart Disease Risk Table                     Men   Women  1/2 Average Risk   3.4   3.3  Average Risk       5.0   4.4  2 X Average Risk   9.6   7.1  3 X Average Risk  23.4   11.0        Use the calculated Patient Ratio above and the CHD Risk Table to determine the patient's CHD Risk.        ATP III CLASSIFICATION (LDL):  <100     mg/dL   Optimal  899-870  mg/dL   Near or Above                    Optimal  130-159  mg/dL   Borderline  839-810  mg/dL   High  >809     mg/dL   Very High Performed at [facility], [address], [place], [place] [address]    TSH 06/14/2023 1.316  0.350 - 4.500 uIU/mL Final   Comment: Performed by a 3rd Generation assay with a functional sensitivity of <=0.01 uIU/mL. Performed at [facility], [address], [place], [place] [address]    Preg Test, Ur 06/14/2023 Negative  Negative Final   POC Amphetamine UR 06/14/2023 None Detected  NONE DETECTED (Cut Off Level 1000 ng/mL) Final   POC Secobarbital (BAR) 06/14/2023 None Detected  NONE DETECTED (Cut Off Level 300 ng/mL) Final   POC Buprenorphine  (BUP) 06/14/2023 Positive (A)  NONE DETECTED (Cut Off Level 10 ng/mL) Final   POC Oxazepam (BZO) 06/14/2023 Positive (A)  NONE DETECTED (Cut Off Level 300 ng/mL) Final   POC Cocaine UR 06/14/2023 Positive (A)  NONE DETECTED (Cut Off Level 300 ng/mL) Final   POC Methamphetamine UR 06/14/2023 Positive (A)  NONE DETECTED (Cut Off Level 1000 ng/mL) Final   POC Morphine  06/14/2023 None Detected  NONE DETECTED (Cut Off Level 300 ng/mL) Final   POC Methadone UR 06/14/2023 None Detected  NONE DETECTED (Cut Off Level 300 ng/mL) Final   POC Oxycodone  UR 06/14/2023  Positive (A)  NONE DETECTED (Cut Off Level 100 ng/mL) Final   POC Marijuana UR 06/14/2023 Positive (A)  NONE DETECTED (Cut Off Level 50 ng/mL) Final  Admission on 05/30/2023, Discharged on 05/31/2023  Component Date Value Ref Range Status   WBC 05/31/2023 6.5  4.0 - 10.5 K/uL Final   RBC 05/31/2023 4.17  3.87 - 5.11 MIL/uL Final   Hemoglobin 05/31/2023 11.9 (L)  12.0 - 15.0 g/dL Final   HCT 87/75/7975 35.8 (L)  36.0 - 46.0 % Final   MCV 05/31/2023 85.9  80.0 - 100.0 fL Final   MCH 05/31/2023 28.5  26.0 - 34.0 pg Final   MCHC 05/31/2023 33.2  30.0 - 36.0 g/dL Final   RDW 87/75/7975 13.6  11.5 - 15.5 % Final   Platelets 05/31/2023 166  150 - 400 K/uL Final   nRBC 05/31/2023 0.0  0.0 - 0.2 % Final   Neutrophils Relative % 05/31/2023 63  % Final   Neutro Abs 05/31/2023 4.0  1.7 - 7.7 K/uL Final   Lymphocytes Relative 05/31/2023 28  % Final   Lymphs Abs 05/31/2023 1.8  0.7 - 4.0 K/uL Final   Monocytes Relative 05/31/2023 8  % Final   Monocytes Absolute 05/31/2023 0.5  0.1 - 1.0 K/uL Final   Eosinophils Relative 05/31/2023 1  % Final   Eosinophils Absolute 05/31/2023 0.1  0.0 - 0.5 K/uL Final   Basophils Relative 05/31/2023 0  % Final   Basophils Absolute 05/31/2023 0.0  0.0 - 0.1 K/uL Final   Immature Granulocytes 05/31/2023 0  % Final   Abs Immature Granulocytes 05/31/2023 0.02  0.00 - 0.07 K/uL Final   Performed at [facility], [address] [place], [place] [address]   Sodium 05/31/2023 140  135 - 145 mmol/L Final   Potassium 05/31/2023 3.9  3.5 - 5.1 mmol/L Final   HEMOLYSIS AT THIS LEVEL MAY AFFECT RESULT   Chloride 05/31/2023 109  98 - 111 mmol/L Final   CO2 05/31/2023 22  22 - 32 mmol/L Final   Glucose, Bld 05/31/2023 110 (H)  70 - 99 mg/dL Final   Glucose reference range applies only to samples taken after fasting for at least 8 hours.   BUN 05/31/2023 12  6 - 20 mg/dL Final   Creatinine, Ser 05/31/2023 0.53  0.44 - 1.00 mg/dL Final   Calcium  05/31/2023  8.5 (L)  8.9 - 10.3 mg/dL Final   Total Protein 87/75/7975 6.4 (L)  6.5 - 8.1 g/dL Final   Albumin 87/75/7975 3.6  3.5 - 5.0 g/dL Final   AST 87/75/7975 26  15 - 41 U/L Final   HEMOLYSIS AT THIS LEVEL MAY AFFECT RESULT   ALT 05/31/2023 12  0 - 44 U/L Final   HEMOLYSIS AT THIS LEVEL MAY AFFECT RESULT   Alkaline Phosphatase  05/31/2023 44  38 - 126 U/L Final   Total Bilirubin 05/31/2023 0.7  <1.2 mg/dL Final   HEMOLYSIS AT THIS LEVEL MAY AFFECT RESULT   GFR, Estimated 05/31/2023 >60  >60 mL/min Final   Comment: (NOTE) Calculated using the CKD-EPI Creatinine Equation (2021)    Anion gap 05/31/2023 9  5 - 15 Final   Performed at [facility], [address] [place], [place] [address]   Color, Urine 05/31/2023 YELLOW  YELLOW Final   APPearance 05/31/2023 CLEAR  CLEAR Final   Specific Gravity, Urine 05/31/2023 1.020  1.005 - 1.030 Final   pH 05/31/2023 5.0  5.0 - 8.0 Final   Glucose, UA 05/31/2023 NEGATIVE  NEGATIVE mg/dL Final   Hgb urine dipstick 05/31/2023 NEGATIVE  NEGATIVE Final   Bilirubin Urine 05/31/2023 NEGATIVE  NEGATIVE Final   Ketones, ur 05/31/2023 NEGATIVE  NEGATIVE mg/dL Final   Protein, ur 87/75/7975 NEGATIVE  NEGATIVE mg/dL Final   Nitrite 87/75/7975 NEGATIVE  NEGATIVE Final   Leukocytes,Ua 05/31/2023 NEGATIVE  NEGATIVE Final   Performed at [facility], [address], [place], [place] [address]   Opiates 05/31/2023 NONE DETECTED  NONE DETECTED Final   Cocaine 05/31/2023 POSITIVE (A)  NONE DETECTED Final   Benzodiazepines 05/31/2023 POSITIVE (A)  NONE DETECTED Final   Amphetamines 05/31/2023 POSITIVE (A)  NONE DETECTED Final   Tetrahydrocannabinol 05/31/2023 POSITIVE (A)  NONE DETECTED Final   Barbiturates 05/31/2023 NONE DETECTED  NONE DETECTED Final   Comment: (NOTE) DRUG SCREEN FOR MEDICAL PURPOSES ONLY.  IF CONFIRMATION IS NEEDED FOR ANY PURPOSE, NOTIFY LAB WITHIN 5 DAYS.  LOWEST DETECTABLE LIMITS FOR URINE DRUG  SCREEN Drug Class                     Cutoff (ng/mL) Amphetamine and metabolites    1000 Barbiturate and metabolites    200 Benzodiazepine                 200 Opiates and metabolites        300 Cocaine and metabolites        300 THC                            50 Performed at [facility], [address], [place], [place] [address]    Preg Test, Ur 05/31/2023 NEGATIVE  NEGATIVE Final   Comment:        THE SENSITIVITY OF THIS METHODOLOGY IS >25 mIU/mL. Performed at [facility], [address], [place], [place] [address]   Admission on 05/03/2023, Discharged on 05/10/2023  Component Date Value Ref Range Status   Color, Urine 05/03/2023 AMBER (A)  YELLOW Final   BIOCHEMICALS MAY BE AFFECTED BY COLOR   APPearance 05/03/2023 CLOUDY (A)  CLEAR Final   Specific Gravity, Urine 05/03/2023 1.025  1.005 - 1.030 Final   pH 05/03/2023 5.0  5.0 - 8.0 Final   Glucose, UA 05/03/2023 NEGATIVE  NEGATIVE mg/dL Final   Hgb urine dipstick 05/03/2023 NEGATIVE  NEGATIVE Final   Bilirubin Urine 05/03/2023 NEGATIVE  NEGATIVE Final   Ketones, ur 05/03/2023 NEGATIVE  NEGATIVE mg/dL Final   Protein, ur 88/73/7975 NEGATIVE  NEGATIVE mg/dL Final   Nitrite 88/73/7975 NEGATIVE  NEGATIVE Final   Leukocytes,Ua 05/03/2023 SMALL (A)  NEGATIVE Final   RBC / HPF 05/03/2023 6-10  0 - 5 RBC/hpf Final   WBC, UA 05/03/2023 21-50  0 -  5 WBC/hpf Final   Bacteria, UA 05/03/2023 FEW (A)  NONE SEEN Final   Squamous Epithelial / HPF 05/03/2023 21-50  0 - 5 /HPF Final   Mucus 05/03/2023 PRESENT   Final   Performed at [facility], [address], [place], [place] [address]  Admission on 05/03/2023, Discharged on 05/03/2023  Component Date Value Ref Range Status   WBC 05/03/2023 6.8  4.0 - 10.5 K/uL Final   RBC 05/03/2023 4.01  3.87 - 5.11 MIL/uL Final   Hemoglobin 05/03/2023 11.0 (L)  12.0 - 15.0 g/dL Final   HCT 88/73/7975 33.7 (L)  36.0 - 46.0 % Final   MCV  05/03/2023 84.0  80.0 - 100.0 fL Final   MCH 05/03/2023 27.4  26.0 - 34.0 pg Final   MCHC 05/03/2023 32.6  30.0 - 36.0 g/dL Final   RDW 88/73/7975 13.5  11.5 - 15.5 % Final   Platelets 05/03/2023 158  150 - 400 K/uL Final   nRBC 05/03/2023 0.0  0.0 - 0.2 % Final   Neutrophils Relative % 05/03/2023 62  % Final   Neutro Abs 05/03/2023 4.2  1.7 - 7.7 K/uL Final   Lymphocytes Relative 05/03/2023 30  % Final   Lymphs Abs 05/03/2023 2.0  0.7 - 4.0 K/uL Final   Monocytes Relative 05/03/2023 7  % Final   Monocytes Absolute 05/03/2023 0.5  0.1 - 1.0 K/uL Final   Eosinophils Relative 05/03/2023 1  % Final   Eosinophils Absolute 05/03/2023 0.1  0.0 - 0.5 K/uL Final   Basophils Relative 05/03/2023 0  % Final   Basophils Absolute 05/03/2023 0.0  0.0 - 0.1 K/uL Final   Immature Granulocytes 05/03/2023 0  % Final   Abs Immature Granulocytes 05/03/2023 0.01  0.00 - 0.07 K/uL Final   Performed at [facility], [address], [place], [place] [address]   Sodium 05/03/2023 134 (L)  135 - 145 mmol/L Final   Potassium 05/03/2023 3.6  3.5 - 5.1 mmol/L Final   Chloride 05/03/2023 101  98 - 111 mmol/L Final   CO2 05/03/2023 29  22 - 32 mmol/L Final   Glucose, Bld 05/03/2023 93  70 - 99 mg/dL Final   Glucose reference range applies only to samples taken after fasting for at least 8 hours.   BUN 05/03/2023 8  6 - 20 mg/dL Final   Creatinine, Ser 05/03/2023 0.74  0.44 - 1.00 mg/dL Final   Calcium  05/03/2023 8.6 (L)  8.9 - 10.3 mg/dL Final   Total Protein 88/73/7975 6.2 (L)  6.5 - 8.1 g/dL Final   Albumin 88/73/7975 3.4 (L)  3.5 - 5.0 g/dL Final   AST 88/73/7975 19  15 - 41 U/L Final   ALT 05/03/2023 21  0 - 44 U/L Final   Alkaline Phosphatase 05/03/2023 55  38 - 126 U/L Final   Total Bilirubin 05/03/2023 0.4  <1.2 mg/dL Final   GFR, Estimated 05/03/2023 >60  >60 mL/min Final   Comment: (NOTE) Calculated using the CKD-EPI Creatinine Equation (2021)    Anion gap 05/03/2023 4 (L)  5 - 15 Final    Performed at [facility], [address], [place], [place] [address]   Alcohol , Ethyl (B) 05/03/2023 <10  <10 mg/dL Final   Comment: (NOTE) Lowest detectable limit for serum alcohol  is 10 mg/dL.  For medical purposes only. Performed at [facility], [address] [place], [place] [address]    TSH 05/03/2023 0.332 (L)  0.350 -  4.500 uIU/mL Final   Comment: Performed by a 3rd Generation assay with a functional sensitivity of <=0.01 uIU/mL. Performed at [facility], [address] [place], [place] [address]    POC Amphetamine UR 05/03/2023 Positive (A)  NONE DETECTED (Cut Off Level 1000 ng/mL) Final   POC Secobarbital (BAR) 05/03/2023 None Detected  NONE DETECTED (Cut Off Level 300 ng/mL) Final   POC Buprenorphine  (BUP) 05/03/2023 Positive (A)  NONE DETECTED (Cut Off Level 10 ng/mL) Final   POC Oxazepam (BZO) 05/03/2023 Positive (A)  NONE DETECTED (Cut Off Level 300 ng/mL) Final   POC Cocaine UR 05/03/2023 Positive (A)  NONE DETECTED (Cut Off Level 300 ng/mL) Final   POC Methamphetamine UR 05/03/2023 Positive (A)  NONE DETECTED (Cut Off Level 1000 ng/mL) Final   POC Morphine  05/03/2023 None Detected  NONE DETECTED (Cut Off Level 300 ng/mL) Final   POC Methadone UR 05/03/2023 None Detected  NONE DETECTED (Cut Off Level 300 ng/mL) Final   POC Oxycodone  UR 05/03/2023 Positive (A)  NONE DETECTED (Cut Off Level 100 ng/mL) Final   POC Marijuana UR 05/03/2023 Positive (A)  NONE DETECTED (Cut Off Level 50 ng/mL) Final   Preg Test, Ur 05/03/2023 Negative  Negative Final   Cholesterol 05/03/2023 150  0 - 200 mg/dL Final   Triglycerides 88/73/7975 145  <150 mg/dL Final   HDL 88/73/7975 47  >40 mg/dL Final   Total CHOL/HDL Ratio 05/03/2023 3.2  RATIO Final   VLDL 05/03/2023 29  0 - 40 mg/dL Final   LDL Cholesterol 05/03/2023 74  0 - 99 mg/dL Final   Comment:        Total Cholesterol/HDL:CHD Risk Coronary Heart Disease Risk Table                     Men   Women  1/2 Average  Risk   3.4   3.3  Average Risk       5.0   4.4  2 X Average Risk   9.6   7.1  3 X Average Risk  23.4   11.0        Use the calculated Patient Ratio above and the CHD Risk Table to determine the patient's CHD Risk.        ATP III CLASSIFICATION (LDL):  <100     mg/dL   Optimal  899-870  mg/dL   Near or Above                    Optimal  130-159  mg/dL   Borderline  839-810  mg/dL   High  >809     mg/dL   Very High Performed at [facility], [address], [place], [place] [address]     Allergies: Patient has no known allergies.  Medications:  Facility Ordered Medications  Medication   acetaminophen  (TYLENOL ) tablet 650 mg   alum & mag hydroxide-simeth (MAALOX/MYLANTA) 200-200-20 MG/5ML suspension 30 mL   magnesium  hydroxide (MILK OF MAGNESIA) suspension 30 mL   dicyclomine  (BENTYL ) tablet 20 mg   hydrOXYzine  (ATARAX ) tablet 25 mg   loperamide  (IMODIUM ) capsule 2-4 mg   methocarbamol  (ROBAXIN ) tablet 500 mg   naproxen  (NAPROSYN ) tablet 500 mg   ondansetron  (ZOFRAN -ODT) disintegrating tablet 4 mg   OLANZapine  zydis (ZYPREXA ) disintegrating tablet 5 mg   And   ziprasidone  (GEODON ) injection 20 mg   [COMPLETED] buprenorphine -naloxone  (SUBOXONE ) 8-2 mg per SL tablet 1 tablet    Screenings  Flowsheet Row Most Recent Value  COWS Total Score 0       Medical Decision Making  Inpatient observation   Lab Orders         CBC with Differential/Platelet         Comprehensive metabolic panel         Ethanol         Lipid panel         TSH         POC urine preg, ED         POCT Urine Drug Screen - (I-Screen)      Meds ordered this encounter  Medications   acetaminophen  (TYLENOL ) tablet 650 mg   alum & mag hydroxide-simeth (MAALOX/MYLANTA) 200-200-20 MG/5ML suspension 30 mL   magnesium  hydroxide (MILK OF MAGNESIA) suspension 30 mL   dicyclomine  (BENTYL ) tablet 20 mg   hydrOXYzine  (ATARAX ) tablet 25 mg   loperamide  (IMODIUM ) capsule 2-4 mg   methocarbamol   (ROBAXIN ) tablet 500 mg   naproxen  (NAPROSYN ) tablet 500 mg   ondansetron  (ZOFRAN -ODT) disintegrating tablet 4 mg   AND Linked Order Group    OLANZapine  zydis (ZYPREXA ) disintegrating tablet 5 mg    ziprasidone  (GEODON ) injection 20 mg   buprenorphine -naloxone  (SUBOXONE ) 8-2 mg per SL tablet 1 tablet     Recommendations  Based on my evaluation the patient does not appear to have an emergency medical condition.  Gaither Pouch, NP 06/15/23  5:16 AM

## 2023-06-15 ENCOUNTER — Encounter (HOSPITAL_COMMUNITY): Payer: Self-pay | Admitting: Emergency Medicine

## 2023-06-15 LAB — CBC WITH DIFFERENTIAL/PLATELET
Abs Immature Granulocytes: 0.02 10*3/uL (ref 0.00–0.07)
Basophils Absolute: 0 10*3/uL (ref 0.0–0.1)
Basophils Relative: 1 %
Eosinophils Absolute: 0.1 10*3/uL (ref 0.0–0.5)
Eosinophils Relative: 1 %
HCT: 36.8 % (ref 36.0–46.0)
Hemoglobin: 12.2 g/dL (ref 12.0–15.0)
Immature Granulocytes: 0 %
Lymphocytes Relative: 37 %
Lymphs Abs: 2.2 10*3/uL (ref 0.7–4.0)
MCH: 28.1 pg (ref 26.0–34.0)
MCHC: 33.2 g/dL (ref 30.0–36.0)
MCV: 84.8 fL (ref 80.0–100.0)
Monocytes Absolute: 0.4 10*3/uL (ref 0.1–1.0)
Monocytes Relative: 7 %
Neutro Abs: 3.1 10*3/uL (ref 1.7–7.7)
Neutrophils Relative %: 54 %
Platelets: 194 10*3/uL (ref 150–400)
RBC: 4.34 MIL/uL (ref 3.87–5.11)
RDW: 13.2 % (ref 11.5–15.5)
WBC: 5.9 10*3/uL (ref 4.0–10.5)
nRBC: 0 % (ref 0.0–0.2)

## 2023-06-15 LAB — COMPREHENSIVE METABOLIC PANEL
ALT: 14 U/L (ref 0–44)
AST: 16 U/L (ref 15–41)
Albumin: 3.2 g/dL — ABNORMAL LOW (ref 3.5–5.0)
Alkaline Phosphatase: 61 U/L (ref 38–126)
Anion gap: 8 (ref 5–15)
BUN: 9 mg/dL (ref 6–20)
CO2: 27 mmol/L (ref 22–32)
Calcium: 8.8 mg/dL — ABNORMAL LOW (ref 8.9–10.3)
Chloride: 103 mmol/L (ref 98–111)
Creatinine, Ser: 0.64 mg/dL (ref 0.44–1.00)
GFR, Estimated: >60 mL/min (ref >60)
Glucose, Bld: 92 mg/dL (ref 70–99)
Potassium: 4.1 mmol/L (ref 3.5–5.1)
Sodium: 138 mmol/L (ref 135–145)
Total Bilirubin: 0.4 mg/dL (ref 0.0–1.2)
Total Protein: 5.9 g/dL — ABNORMAL LOW (ref 6.5–8.1)

## 2023-06-15 LAB — LIPID PANEL
Cholesterol: 211 mg/dL — ABNORMAL HIGH (ref 0–200)
HDL: 49 mg/dL (ref >40)
LDL Cholesterol: 111 mg/dL — ABNORMAL HIGH (ref 0–99)
Total CHOL/HDL Ratio: 4.3 {ratio}
Triglycerides: 256 mg/dL — ABNORMAL HIGH (ref <150)
VLDL: 51 mg/dL — ABNORMAL HIGH (ref 0–40)

## 2023-06-15 LAB — TSH: TSH: 1.316 u[IU]/mL (ref 0.350–4.500)

## 2023-06-15 LAB — ETHANOL: Alcohol, Ethyl (B): <10 mg/dL (ref <10)

## 2023-06-15 NOTE — ED Notes (Signed)
 Pt discharged with  AVS.  AVS reviewed prior to discharge.  Pt alert, oriented, and ambulatory.  Safety maintained.

## 2023-06-15 NOTE — ED Notes (Signed)
 Patient was provided lunch

## 2023-06-15 NOTE — ED Notes (Signed)
Patient denied lunch

## 2023-06-15 NOTE — ED Provider Notes (Signed)
 FBC/OBS ASAP Discharge Summary  Date and Time: 06/15/2023 4:37 PM  Name: Hailey Perez  MRN:  [id number]   Discharge Diagnoses:  Final diagnoses:  Polysubstance abuse (HCC)  Recurrent major depressive disorder, remission status unspecified (HCC)    Subjective: Hailey Perez is a 48 year old female with a history of MDD, suicidal ideations and polysubstance abuse (Xanax, cocaine, marijuana).  Patient presented to [facility] voluntarily seeking rehab from Xanax use.  Patient reports that her therapist instructed her to come to [facility] to detox from Xanax and cocaine in order for patient to go to intensive outpatient program.  Patient attends [facility] where patient has been prescribed suboxone .  06/14/2023-Hailey Perez, 48 y/o female with a history of polysubstance abuse (Xanax, cocaine, and marijuana), MDD, acute psychosis.  Presented to [facility] voluntarily seeking rehab from Xanax use.  According to the patient she went to her therapist today and the therapist told her to come here to get rehab from Xanax and cocaine.  According to the patient she last took Xanax yesterday and cocaine patient also reports she smoked marijuana daily.  Patient stated that she has calm down a little because she used to do heavy drugs.  According to the patient she lives with her mother, currently unemployed.  I review of patient records show multiple visits for similar situation patient was also admitted to [facility] back in November 2024.  Patient also stated that she is currently taking Suboxone  4/2 mg    Stay Summary: Patient received psychiatric evaluation and treatment treatment in [facility] continues observation unit.  Patient denied any withdrawal symptoms.  Patient is alert oriented x 4, calm and cooperative, speech is clear and coherent, thought process is logical and goal-directed mood is euthymic with congruent affect, denies any SI HI or AVH. Patient  reports her mood is stable and patient is motivated to continue outpatient substance abuse treatment. Reviewed patient's case with Dr. Zouev. Patient does not appear to be a danger to herself or others and can be safely discharged to follow-up with treatment in an outpatient setting.   Total Time spent with patient: 15 minutes  Past Psychiatric History: [employer] Past Medical History: substance abuse   Past Surgical History:  Procedure Laterality Date   I & D EXTREMITY Right 12/12/2014   Procedure: IRRIGATION AND DEBRIDEMENT EXTREMITY;  Surgeon: Balinda Rogue, MD;  Location: [facility];  Service: Plastics;  Laterality: Right;   TUBAL LIGATION      Family History: unknown Family Psychiatric History: unknown Social History: 48 y.o female, polysubstance abuse, unemployed, currently living with her mother Tobacco Cessation:  Prescription not provided because: Patient declined any tobacco cessation products.  Current Medications:  Current Facility-Administered Medications  Medication Dose Route Frequency Provider Last Rate Last Admin   acetaminophen  (TYLENOL ) tablet 650 mg  650 mg Oral Q6H PRN Trudy Carwin, NP       alum & mag hydroxide-simeth (MAALOX/MYLANTA) 200-200-20 MG/5ML suspension 30 mL  30 mL Oral Q4H PRN Trudy Carwin, NP       dicyclomine  (BENTYL ) tablet 20 mg  20 mg Oral Q6H PRN Trudy Carwin, NP       hydrOXYzine  (ATARAX ) tablet 25 mg  25 mg Oral Q6H PRN Trudy Carwin, NP       loperamide  (IMODIUM ) capsule 2-4 mg  2-4 mg Oral PRN Trudy Carwin, NP       magnesium  hydroxide (MILK OF MAGNESIA) suspension 30 mL  30 mL Oral Daily PRN Trudy Carwin, NP       methocarbamol  (ROBAXIN ) tablet 500 mg  500 mg Oral Q8H PRN Trudy Carwin, NP       naproxen  (NAPROSYN ) tablet 500 mg  500 mg Oral BID PRN Trudy Carwin, NP       OLANZapine  zydis (ZYPREXA ) disintegrating tablet 5 mg  5 mg Oral Q8H PRN Trudy Carwin, NP       And   ziprasidone  (GEODON ) injection 20 mg  20 mg  Intramuscular PRN Trudy Carwin, NP       ondansetron  (ZOFRAN -ODT) disintegrating tablet 4 mg  4 mg Oral Q6H PRN Trudy Carwin, NP       Current Outpatient Medications  Medication Sig Dispense Refill   DENTA 5000 PLUS 1.1 % CREA dental cream Take 1 Application by mouth in the morning and at bedtime.     FLUoxetine  (PROZAC ) 20 MG capsule Take 40 mg by mouth daily.     Prenatal Vit-Fe Fumarate-FA (M-NATAL PLUS) 27-1 MG TABS Take 1 tablet by mouth daily.     SUBOXONE  8-2 MG FILM Place 1 Film under the tongue 3 (three) times daily.     traZODone  (DESYREL ) 50 MG tablet Take 1 tablet (50 mg total) by mouth at bedtime as needed for sleep. 30 tablet 0   naloxone  (NARCAN ) nasal spray 4 mg/0.1 mL Place 1 spray into the nose once as needed (For opioid overdose). (Patient not taking: Reported on 06/15/2023)      PTA Medications:  Facility Ordered Medications  Medication   acetaminophen  (TYLENOL ) tablet 650 mg   alum & mag hydroxide-simeth (MAALOX/MYLANTA) 200-200-20 MG/5ML suspension 30 mL   magnesium  hydroxide (MILK OF MAGNESIA) suspension 30 mL   dicyclomine  (BENTYL ) tablet 20 mg   hydrOXYzine  (ATARAX ) tablet 25 mg   loperamide  (IMODIUM ) capsule 2-4 mg   methocarbamol  (ROBAXIN ) tablet 500 mg   naproxen  (NAPROSYN ) tablet 500 mg   ondansetron  (ZOFRAN -ODT) disintegrating tablet 4 mg   OLANZapine  zydis (ZYPREXA ) disintegrating tablet 5 mg   And   ziprasidone  (GEODON ) injection 20 mg   [COMPLETED] buprenorphine -naloxone  (SUBOXONE ) 8-2 mg per SL tablet 1 tablet   PTA Medications  Medication Sig   traZODone  (DESYREL ) 50 MG tablet Take 1 tablet (50 mg total) by mouth at bedtime as needed for sleep.   SUBOXONE  8-2 MG FILM Place 1 Film under the tongue 3 (three) times daily.   Prenatal Vit-Fe Fumarate-FA (M-NATAL PLUS) 27-1 MG TABS Take 1 tablet by mouth daily.   DENTA 5000 PLUS 1.1 % CREA dental cream Take 1 Application by mouth in the morning and at bedtime.   FLUoxetine  (PROZAC ) 20 MG capsule Take  40 mg by mouth daily.   naloxone  (NARCAN ) nasal spray 4 mg/0.1 mL Place 1 spray into the nose once as needed (For opioid overdose). (Patient not taking: Reported on 06/15/2023)       04/14/2023    5:22 PM 07/16/2022    3:52 PM  Depression screen PHQ 2/9  Decreased Interest 1 2  Down, Depressed, Hopeless 2 3  PHQ - 2 Score 3 5  Altered sleeping 2 3  Tired, decreased energy 2 2  Change in appetite 2 2  Feeling bad or failure about yourself  2 3  Trouble concentrating 2 2  Moving slowly or fidgety/restless  0  Suicidal thoughts 0 0  PHQ-9 Score 13 17  Difficult doing work/chores Somewhat difficult Somewhat difficult    Flowsheet Row ED from 06/14/2023 in [facility] Most recent reading at 06/14/2023  9:55 PM [facility] (Discharged[facility] Most recent reading at 05/03/2023 10:12 PM ED from 05/03/2023 in [facility] Most recent reading at 05/03/2023  7:26 PM  C-SSRS RISK CATEGORY No Risk Low Risk No Risk       Musculoskeletal  Strength & Muscle Tone: within normal limits Gait & Station: normal Patient leans: N/A  Psychiatric Specialty Exam  Presentation  General Appearance:  Casual  Eye Contact: Good  Speech: Clear and Coherent  Speech Volume: Normal  Handedness: Right   Mood and Affect  Mood: Anxious  Affect: Appropriate   Thought Process  Thought Processes: Coherent  Descriptions of Associations:Circumstantial  Orientation:Full (Time, Place and Person)  Thought Content:Logical  Diagnosis of Schizophrenia or Schizoaffective disorder in past: No    Hallucinations:Hallucinations: None  Ideas of Reference:None  Suicidal Thoughts:Suicidal Thoughts: No  Homicidal Thoughts:Homicidal Thoughts: No   Sensorium  Memory: Immediate Fair  Judgment: Fair  Insight: Fair   Art Therapist  Concentration: Fair  Attention  Span: Fair  Recall: Good  Fund of Knowledge: Good  Language: Good   Psychomotor Activity  Psychomotor Activity: Psychomotor Activity: Normal   Assets  Assets: Desire for Improvement; Resilience; Vocational/Educational   Sleep  Sleep: Sleep: Fair Number of Hours of Sleep: 7   Nutritional Assessment (For OBS and FBC admissions only) Has the patient had a weight loss or gain of 10 pounds or more in the last 3 months?: No Has the patient had a decrease in food intake/or appetite?: No Does the patient have dental problems?: No Does the patient have eating habits or behaviors that may be indicators of an eating disorder including binging or inducing vomiting?: No Has the patient recently lost weight without trying?: 0 Has the patient been eating poorly because of a decreased appetite?: 0 Malnutrition Screening Tool Score: 0    Physical Exam  Physical Exam HENT:     Head: Normocephalic.     Nose: Nose normal.  Eyes:     Pupils: Pupils are equal, round, and reactive to light.  Cardiovascular:     Rate and Rhythm: Normal rate.  Pulmonary:     Effort: Pulmonary effort is normal.  Abdominal:     General: Abdomen is flat.  Musculoskeletal:        General: Normal range of motion.     Cervical back: Normal range of motion.  Skin:    General: Skin is warm.  Neurological:     Mental Status: She is alert and oriented to person, place, and time.  Psychiatric:        Attention and Perception: Attention normal.        Mood and Affect: Mood normal.        Speech: Speech normal.        Behavior: Behavior normal.        Thought Content: Thought content normal.        Judgment: Judgment normal.    Review of Systems  Constitutional: Negative.   HENT: Negative.    Eyes: Negative.   Respiratory: Negative.    Cardiovascular: Negative.   Gastrointestinal: Negative.   Genitourinary: Negative.   Musculoskeletal: Negative.   Skin: Negative.   Neurological: Negative.    Endo/Heme/Allergies: Negative.   Psychiatric/Behavioral:  Positive for substance abuse.    Blood pressure 101/68, pulse 69, temperature 97.8 F (36.6 C), temperature source Oral, resp. rate 16, SpO2 97%.  There is no height or weight on file to calculate BMI.  Demographic Factors:  Caucasian, Low socioeconomic status, and Unemployed  Loss Factors: Financial problems/change in socioeconomic status  Historical Factors: Family history of mental illness or substance abuse and Impulsivity  Risk Reduction Factors:   Living with another person, especially a relative, Positive social support, and Positive coping skills or problem solving skills  Continued Clinical Symptoms:  Alcohol /Substance Abuse/Dependencies  Cognitive Features That Contribute To Risk:  None    Suicide Risk:  Minimal: No identifiable suicidal ideation.  Patients presenting with no risk factors but with morbid ruminations; may be classified as minimal risk based on the severity of the depressive symptoms  Plan Of Care/Follow-up recommendations:  Activity-Continue all activities of Daily living as tolerated Diet- Low sodium heart healthy diet  Disposition: Patient will be discharged to follow-up with outpatient provider Zell Console is a*intensive outpatient treatment program recommended by provider. Recommend abstinence from alcohol , tobacco, and other illicit drug use at discharge. Recommend patient continue with the ([facility] program) [facility] where patient has been prescribed suboxone .  -If your psychiatric symptoms recur, worsen, or if you have side effects to your psychiatric medications, call your outpatient psychiatric provider, 911, [phone] or go to the nearest emergency department.   -If suicidal thoughts recur, call your outpatient psychiatric provider, 911, [phone] or go to the nearest emergency department.  Kyanne Rials E Zoraida Havrilla, NP 06/15/2023, 4:37 PM

## 2023-06-15 NOTE — ED Notes (Signed)
 Patient observed/assessed in bed/chair resting quietly appearing in no distress and verbalizing no complaints at this time. Will continue to monitor.

## 2023-06-15 NOTE — ED Notes (Signed)
 Patient has been calm on approach, cooperative with directions. Denies pain or discomfort, denies SI/HI/AVH.  Slept most of shift. Meal offered and excepted. Showered offered and refused. Behavior controlled. Will continue to monitor.

## 2023-06-15 NOTE — Discharge Instructions (Signed)

## 2023-06-15 NOTE — ED Notes (Signed)
 Patient asleep in bed at this time.  No distress or signs of withdrawal.  Will monitor.  Denies avh shi or plan

## 2023-06-17 ENCOUNTER — Ambulatory Visit (HOSPITAL_COMMUNITY): Payer: 59

## 2023-06-17 DIAGNOSIS — F4310 Post-traumatic stress disorder, unspecified: Secondary | ICD-10-CM

## 2023-06-17 DIAGNOSIS — F1520 Other stimulant dependence, uncomplicated: Secondary | ICD-10-CM

## 2023-06-17 DIAGNOSIS — F333 Major depressive disorder, recurrent, severe with psychotic symptoms: Secondary | ICD-10-CM

## 2023-06-17 DIAGNOSIS — F1121 Opioid dependence, in remission: Secondary | ICD-10-CM

## 2023-06-17 DIAGNOSIS — F1420 Cocaine dependence, uncomplicated: Secondary | ICD-10-CM

## 2023-06-17 DIAGNOSIS — F1191 Opioid use, unspecified, in remission: Secondary | ICD-10-CM

## 2023-06-17 DIAGNOSIS — F1220 Cannabis dependence, uncomplicated: Secondary | ICD-10-CM

## 2023-06-17 DIAGNOSIS — F411 Generalized anxiety disorder: Secondary | ICD-10-CM

## 2023-06-17 NOTE — Progress Notes (Addendum)
 Therapist meets with Hailey Perez to do her Care Plan and updated information on her substance use.  Alcohol /Drug Use: Alcohol  / Drug Use Pain Medications: Opioids Prescriptions: more than a year ago Over the Counter: none History of alcohol  / drug use?: No history of alcohol  / drug abuse Longest period of sobriety (when/how long): currently takes Xanex.  Does not know longest perioid of sobriety Negative Consequences of Use: Financial, Legal, Personal relationships Withdrawal Symptoms: None Substance #1 Name of Substance 1: Opioids 1 - Age of First Use: 48 yrs old 1 - Amount (size/oz): 2-3 reams of heroin per day, has also take pain pills (unsure of amount) 1 - Frequency: daily 1 - Duration: 7 yrs 1 - Last Use / Amount: 2-3 weeks ago (heroin) 1 - Method of Aquiring: Illicit 1- Route of Use: injecting heroin, oral for pain pills Substance #2 Name of Substance 2: Marijuana 2 - Age of First Use: 48 2 - Amount (size/oz): 1 blunt 2 - Frequency: daily 2 - Duration: 30 years 2 - Last Use / Amount: 2-3 weeks ago 2 - Method of Aquiring: illicit 2 - Route of Substance Use: smoking Substance #3 Name of Substance 3: Cocaine 3 - Age of First Use: 48 3 - Amount (size/oz): can't remember 3 - Frequency: not every day 3 - Duration: 23 years 3 - Last Use / Amount: 2-3 weeks ago, $20.00 3 - Method of Aquiring: illicit 3 - Route of Substance Use: inhalation Substance #4 Name of Substance 4: Benzodiazapines 4 - Age of First Use: unsure 4 - Amount (size/oz): 1 mg to 4mg  4 - Frequency: daily 4 - Duration: ? 4 - Last Use / Amount: 04/30/23 4 - Method of Aquiring: illicit 4 - Route of Substance Use: oral  Therapist calls Hailey Perez this afternoon to update the most current substance information, therapist asked about methamphetamine use, thinking she had this diagnosis, however therapist realized afterwards Hailey Perez does not carry this diagnosis as she had not revealed this to the therapist when  she did the [facility] or to [employer] when she was transferred to him due to having private insurance.    When asked about methamphetamine use, Hailey Perez says she started using at age 48 and her last use was one month ago when she used 1 line and inhaled it. Therapists will clarify this information to see if she meets criteria for a Methamphetamine diagnosis.  Hailey Perez reports she takes Suboxone  for her Opioid Use Disorder as prescribed by [facility].     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

## 2023-06-20 ENCOUNTER — Ambulatory Visit (INDEPENDENT_AMBULATORY_CARE_PROVIDER_SITE_OTHER): Payer: PRIVATE HEALTH INSURANCE | Admitting: Licensed Clinical Social Worker

## 2023-06-20 ENCOUNTER — Encounter (HOSPITAL_COMMUNITY): Payer: Self-pay | Admitting: Medical

## 2023-06-20 VITALS — BP 119/77 | HR 64 | Ht 61.0 in | Wt 132.4 lb

## 2023-06-20 DIAGNOSIS — F411 Generalized anxiety disorder: Secondary | ICD-10-CM

## 2023-06-20 DIAGNOSIS — F1520 Other stimulant dependence, uncomplicated: Secondary | ICD-10-CM

## 2023-06-20 DIAGNOSIS — F1920 Other psychoactive substance dependence, uncomplicated: Secondary | ICD-10-CM | POA: Diagnosis not present

## 2023-06-20 DIAGNOSIS — F1420 Cocaine dependence, uncomplicated: Secondary | ICD-10-CM | POA: Diagnosis not present

## 2023-06-20 DIAGNOSIS — F333 Major depressive disorder, recurrent, severe with psychotic symptoms: Secondary | ICD-10-CM

## 2023-06-20 DIAGNOSIS — F1220 Cannabis dependence, uncomplicated: Secondary | ICD-10-CM

## 2023-06-20 DIAGNOSIS — F4310 Post-traumatic stress disorder, unspecified: Secondary | ICD-10-CM

## 2023-06-20 DIAGNOSIS — F1121 Opioid dependence, in remission: Secondary | ICD-10-CM

## 2023-06-20 MED ORDER — FLUOXETINE HCL 20 MG PO CAPS: ORAL_CAPSULE | ORAL | 2 refills | Status: AC

## 2023-06-20 NOTE — Progress Notes (Signed)
 Program: [facility]     Group Time: 9 am-12 pm      Type of Therapy: Process and Psychoeducational    Topic: The therapists check in with group members, assess for SI/HI/psychosis and overall level of functioning. The therapists inquire about sobriety date and number of community support meetings attended since last session.    The therapists introduces two new group members. The presents information on redefining fun, learning from setbacks, and becoming comfortable with being uncomfortable and facilitates group discussion on these topics.      Summary: Hailey Perez presents rating her depression as a 5 and her anxiety as a 3. She describes her mood as nervous and anxious.   She is, once again, silent throughout today's group discussions; however, she appears attentive.    Progress Towards Goals: Conni reports no change in her sobriety date.     UDS collected: No Results: No   AA/NA attended?:  No   Sponsor?:  No   Elsie Maier, MA, [place], [facility], LCAS Darice Simpler, MS, LMFT, LCAS 06/20/2023
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
## 2023-06-22 ENCOUNTER — Ambulatory Visit (INDEPENDENT_AMBULATORY_CARE_PROVIDER_SITE_OTHER): Payer: PRIVATE HEALTH INSURANCE

## 2023-06-22 DIAGNOSIS — F1420 Cocaine dependence, uncomplicated: Secondary | ICD-10-CM | POA: Diagnosis not present

## 2023-06-22 DIAGNOSIS — F1220 Cannabis dependence, uncomplicated: Secondary | ICD-10-CM | POA: Diagnosis not present

## 2023-06-22 DIAGNOSIS — F411 Generalized anxiety disorder: Secondary | ICD-10-CM

## 2023-06-22 DIAGNOSIS — F1920 Other psychoactive substance dependence, uncomplicated: Secondary | ICD-10-CM

## 2023-06-22 DIAGNOSIS — F4310 Post-traumatic stress disorder, unspecified: Secondary | ICD-10-CM

## 2023-06-22 DIAGNOSIS — F1121 Opioid dependence, in remission: Secondary | ICD-10-CM

## 2023-06-22 DIAGNOSIS — F1520 Other stimulant dependence, uncomplicated: Secondary | ICD-10-CM

## 2023-06-22 NOTE — Progress Notes (Addendum)
 Program: CD IOP     Group Time: 9 am-12 pm      Type of Therapy: Process and Psychoeducational    Topic: The therapists check in with group members, assess for SI/HI/psychosis and overall level of functioning. The therapists inquire about sobriety date and number of community support meetings attended since last session.    The therapist introduces a new group member.  Therapist presents information on self-esteem  and low-esteem and how this develops relative to addiction.  Therapist facilitates group discussion on these topics.   Discussed the role of [employer] in addiction recovery.   Summary: Caydin presents rating her depression as a 5 and her anxiety as a 5. Camyrn says she has the same sober date but has not attended an meetings and does not have a sponsor. She describes her mood as "nervous". Another group member speaks up and says her home meeting group is talking about starting a new meeting for those who don't have transportation and they would provide transportation.  Latitia says that would be good  She is, once again, silent throughout today's group discussions; however, she appears attentive.    Progress Towards Goals: Aamina reports no change in her sobriety date.     UDS collected: No Results: No   AA/NA attended?:  No   Sponsor?:  No   Earnie Gola, MS, LMFT, LCAS [address] [place], [place], [facility], [place]  01/152025

## 2023-06-24 ENCOUNTER — Ambulatory Visit (INDEPENDENT_AMBULATORY_CARE_PROVIDER_SITE_OTHER): Payer: PRIVATE HEALTH INSURANCE | Admitting: Licensed Clinical Social Worker

## 2023-06-24 DIAGNOSIS — F1920 Other psychoactive substance dependence, uncomplicated: Secondary | ICD-10-CM

## 2023-06-24 DIAGNOSIS — F1121 Opioid dependence, in remission: Secondary | ICD-10-CM

## 2023-06-24 DIAGNOSIS — F411 Generalized anxiety disorder: Secondary | ICD-10-CM

## 2023-06-24 DIAGNOSIS — F1220 Cannabis dependence, uncomplicated: Secondary | ICD-10-CM

## 2023-06-24 DIAGNOSIS — F1520 Other stimulant dependence, uncomplicated: Secondary | ICD-10-CM

## 2023-06-24 DIAGNOSIS — F333 Major depressive disorder, recurrent, severe with psychotic symptoms: Secondary | ICD-10-CM

## 2023-06-24 DIAGNOSIS — F1420 Cocaine dependence, uncomplicated: Secondary | ICD-10-CM

## 2023-06-24 DIAGNOSIS — F4310 Post-traumatic stress disorder, unspecified: Secondary | ICD-10-CM

## 2023-06-24 NOTE — Progress Notes (Signed)
Daily Group Progress Note   Program: CD IOP     Group Time: 9 am-12 pm      Type of Therapy: Process and Psychoeducational    Topic: The therapists check in with group members, assess for SI/HI/psychosis and overall level of functioning. The therapists inquire about sobriety date and number of community support meetings attended since last session.    The therapists begin showing the video, "The Brain & Recovery: An Update on the Neuroscience of Addiction" by Dr. Monico Hoar facilitating a group discussion about the information in this video and answering group members' questions. The therapists normalize the social anxiety experienced by many people when they first start attending Twelve Step meetings noting that through continued attendance and exposure therapy that this gets better. The therapists also normalize sleep problems related to PAWS and talk about issues dealing with co-dependent family members.    Summary: Monte presents rating both her depression and anxiety as a 5. 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   Today, the therapist addresses Magda's social anxiety in groups. Marylu complains that her mom is "stepping over the line" in being too much into what Ledia is or is not doing concerning her recovery adding that her mom wants her on medication.  The therapist asks Yaretzie to invite her mother to come in for a family session during IOP which Roma says she will do. She leaves with the other group member to attend an in-person meeting today.   Progress Towards Goals: Naudia reports no change in her sobriety date.     UDS collected: No Results: No   AA/NA attended?:  No   Sponsor?:  No   Alene Mires, MA, [place], [facility], LCAS Remigio Eisenmenger, MS, LMFT, LCAS 06/24/2023

## 2023-06-27 ENCOUNTER — Telehealth (HOSPITAL_COMMUNITY): Payer: Self-pay | Admitting: Licensed Clinical Social Worker

## 2023-06-27 ENCOUNTER — Ambulatory Visit (HOSPITAL_COMMUNITY): Payer: 59

## 2023-06-27 NOTE — Telephone Encounter (Signed)
The therapist calls Lilybelle confirming her identity via two identifiers. Before Aeriella comes to the phone, her mother tells this therapist that Ilean had a bad headache and that last night she was "jerking." Tansey says that she did not sleep well last night due to having extremely vivid dreams and jerking. She says that she will be in group on Wednesday and the therapist suggests that she may need to meet with the PA-C to discuss these symptoms.  Myrna Blazer, MA, LCSW, [facility], LCAS 06/27/2023

## 2023-06-29 ENCOUNTER — Ambulatory Visit (INDEPENDENT_AMBULATORY_CARE_PROVIDER_SITE_OTHER): Payer: PRIVATE HEALTH INSURANCE | Admitting: Licensed Clinical Social Worker

## 2023-06-29 DIAGNOSIS — F4310 Post-traumatic stress disorder, unspecified: Secondary | ICD-10-CM

## 2023-06-29 DIAGNOSIS — F1420 Cocaine dependence, uncomplicated: Secondary | ICD-10-CM

## 2023-06-29 DIAGNOSIS — F1220 Cannabis dependence, uncomplicated: Secondary | ICD-10-CM

## 2023-06-29 DIAGNOSIS — F411 Generalized anxiety disorder: Secondary | ICD-10-CM

## 2023-06-29 DIAGNOSIS — F1520 Other stimulant dependence, uncomplicated: Secondary | ICD-10-CM

## 2023-06-29 DIAGNOSIS — F1121 Opioid dependence, in remission: Secondary | ICD-10-CM

## 2023-06-29 DIAGNOSIS — F1920 Other psychoactive substance dependence, uncomplicated: Secondary | ICD-10-CM

## 2023-06-29 DIAGNOSIS — F333 Major depressive disorder, recurrent, severe with psychotic symptoms: Secondary | ICD-10-CM

## 2023-06-29 NOTE — Progress Notes (Signed)
Daily Group Progress Note   Program: CD IOP     Group Time: 10 a.m. to 12 p.m.      Type of Therapy: Process and Psychoeducational    Topic: The therapist checks in with group members, assesses for SI/HI/psychosis and overall level of functioning. The therapist inquires about sobriety date and number of community support meetings attended since last session.    The therapist facilitates a discussion on how to handle difficult relationships in early recovery assertively and how not doing so can lead to emotional relapse which will eventually lead to actual relapse.    The therapist explains why he does not believe it is helpful to accuse people of "self-sabotaging" and why he does not believe that people actually do this disclosing his belief that people do well when they can.    Summary: Hailey Perez presents rating her depression as a 7 and anxiety as an 8. She describes her mood as "nervous" and "scared" saying that she is scare of "everyday life."  During the discussion about self-sabotage, Hailey Perez, along with some other members, admits that she too has been accused of this.   She attended one meeting with another female group member but says that she has not attended anymore as the woman cannot get in touch with her as Hailey Perez has no phone. She then goes on to say that she could use her mom's phone with the therapist encouraging her to call this member about taking her to additional meetings.   At the conclusion of group, Hailey Perez says that her biggest takeaway is that she needs to stand up for herself more.    Progress Towards Goals: Hailey Perez reports no change in her sobriety date.     UDS collected: Yes  Results: No   AA/NA attended?:  No   Sponsor?:  No   Alene Mires, [place], [place], [facility], LCAS 06/29/2023

## 2023-06-30 ENCOUNTER — Encounter (HOSPITAL_COMMUNITY): Payer: Self-pay | Admitting: Licensed Clinical Social Worker

## 2023-07-01 ENCOUNTER — Ambulatory Visit (INDEPENDENT_AMBULATORY_CARE_PROVIDER_SITE_OTHER): Payer: PRIVATE HEALTH INSURANCE | Admitting: Medical

## 2023-07-01 DIAGNOSIS — F1220 Cannabis dependence, uncomplicated: Secondary | ICD-10-CM | POA: Diagnosis not present

## 2023-07-01 DIAGNOSIS — F1420 Cocaine dependence, uncomplicated: Secondary | ICD-10-CM

## 2023-07-01 DIAGNOSIS — F4310 Post-traumatic stress disorder, unspecified: Secondary | ICD-10-CM

## 2023-07-01 DIAGNOSIS — F332 Major depressive disorder, recurrent severe without psychotic features: Secondary | ICD-10-CM

## 2023-07-01 DIAGNOSIS — F19959 Other psychoactive substance use, unspecified with psychoactive substance-induced psychotic disorder, unspecified: Secondary | ICD-10-CM

## 2023-07-01 DIAGNOSIS — F411 Generalized anxiety disorder: Secondary | ICD-10-CM

## 2023-07-01 DIAGNOSIS — F1920 Other psychoactive substance dependence, uncomplicated: Secondary | ICD-10-CM

## 2023-07-01 DIAGNOSIS — F1121 Opioid dependence, in remission: Secondary | ICD-10-CM

## 2023-07-01 DIAGNOSIS — F1520 Other stimulant dependence, uncomplicated: Secondary | ICD-10-CM

## 2023-07-01 MED ORDER — PRAZOSIN HCL 2 MG PO CAPS: ORAL_CAPSULE | ORAL | 0 refills | Status: DC

## 2023-07-01 MED ORDER — TRAZODONE HCL 50 MG PO TABS: 50.0000 mg | ORAL_TABLET | Freq: Every evening | ORAL | 0 refills | Status: AC | PRN

## 2023-07-01 NOTE — Progress Notes (Signed)
Daily Group Progress Note   Program: CD IOP     Group Time: 10 a.m. to 12 p.m.      Type of Therapy: Process and Psychoeducational    Topic: The therapist checks in with group members, assesses for SI/HI/psychosis and overall level of functioning. The therapist inquires about sobriety date and number of community support meetings attended since last session.    The therapists check in with group members, assess for SI/HI/psychosis and overall level of functioning. The therapists inquire about sobriety date and number of community support meetings attended since last session.     The therapists present information on the stages of recovery focusing on the Abstinence and the Repair Stages detailing the tasks associated with each stage and what sort of issues can result in relapse during each stage. The therapists facilitate a group discussion about this material and especially emphasize the importance of self-compassion, self-care, and meeting attendance. The therapists discuss how spirituality relates to recovery what is meant by the saying, "You are exactly where your Higher Power wants you to be." Additionally, it is noted that suffering when properly handled can play a role in eventually leading to gratitude for the things one has. The therapists explain what is meant by a person's "schema" and how it is difficult to challenge a person's schema. The therapists share the quote, "you will never be able to express yourself clearly enough for those who want to misunderstand you."   Summary: Hailey Perez rates her depression as and anxiety as an "8".  Hailey Perez shares her sobriety date is the same.  She reports she has not attended meetings, nor does she have a sponsor. She shares she hardly slept last night and struggles to make it through group, though she stays awake.    Hailey Perez shares that her emotion is "tired".  Therapist asked about the possibility of bringing her mother in for a family sessions and  Hailey Perez shares that she is not ready for that, yet.  Hailey Perez remains quiet for the remainder of the session.     Progress Towards Goals: Hailey Perez reports no change in her sobriety date.     UDS collected: No Results: No   AA/NA attended?:  No   Sponsor?:  No  Remigio Eisenmenger, MS, LMFT, LCAS [address] [place], [place], [facility] LCAS 06/29/2023

## 2023-07-01 NOTE — Progress Notes (Addendum)
[facility] Follow-up Outpatient CDIOP Date: 07/01/2023  Admission Date:06/20/2023  Sobriety date:Unknown-pt self report does not coincide wit Toxicology from Hospital UDS. Memory may be part of issue?  Subjective: "I can't sleep-keep hearing voices,"  HPI : CD IOP Provider FU Pt seen at request of Counselors for c/o insomnia. Pt admits per Subjective. Admits to nightmares.Not sure of past sleep meds. Says she can take Trazodone.Current medication is Prozac ?40 vs 60 mg. Has never has [employer].  Counselor's report: Summary: Hailey Perez rates her depression as and anxiety as an "8".  Hailey Perez shares her sobriety date is the same.  She reports she has not attended meetings, nor does she have a sponsor. She shares she hardly slept last night and struggles to make it through group, though she stays awake.     Hailey Perez shares that her emotion is "tired".  Therapist asked about the possibility of bringing her mother in for a family sessions and Hailey Perez shares that she is not ready for that, yet.  Hailey Perez remains quiet for the remainder of the session.  Review of Systems: Psychiatric: Agitation: See Counselor report Hallucination: ?Nightmares Depressed Mood: see Counselor reports Insomnia: Profound Hypersomnia: No Altered Concentration: See Counselor's summary Feels Worthless: Yes Grandiose Ideas: Delusional about Mom Belief In Special Powers: No New/Increased Substance Abuse:Ongoing Compulsions: In early withdrawal  Neurologic: Headache: No Seizure: No Paresthesias: No  Current Medications: Your Medication List FLUoxetine 20 MG capsule Commonly known as: PROZAC Take 2 caps in am and 1 cap in pm  prazosin 2 MG capsule Commonly known as: MINIPRESS Take 1 tablets bedtime May increase up to 2 tablets  Suboxone 8-2 MG Film Generic drug: Buprenorphine HCl-Naloxone HCl Place 1 Film under the tongue 3 (three) times daily.  traZODone 50 MG tablet Commonly known as: DESYREL Take 1  tablet (50 mg total) by mouth at bedtime as needed and may repeat dose one time if needed for sleep.    Mental Status Examination  Appearance:Pale fairly groomed Alert: Yes Attention: good  Cooperative: Yes Eye Contact: Fair Speech: Clear and coherent, rate WNL Psychomotor Activity: Slightly slower than Normal Memory:Poor-trauma informed/Distorted by Methamphetamine  Concentration/Attention: Limited Oriented: person, place, time/date and situation Mood: Dysthymic restless Affect: Appropriate and Congruent Thought Processes and Associations: Coherent and Intact Fund of Knowledge:WDL Thought Content: Denies SI/HI Insight:Lacking Judgement:Impaired  Rpt: View report in Results Review for more information WUJ:[id number]  PDMP:Suboxone  Diagnosis:  Cannabis use disorder, severe, dependence (HCC) Severe stimulant use disorder (HCC) Cocaine use disorder, severe, dependence (HCC) Severe opioid use disorder, in early remission, on maintenance therapy (HCC) Polysubstance dependence (HCC) Posttraumatic stress disorder Generalized anxiety disorder Severe episode of recurrent major depressive disorder, 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  POC Amphetamine UR NONE DETECTED (Cut Off Level 1000 ng/mL)  None Detected  POC Secobarbital (BAR) NONE DETECTED (Cut Off Level 300 ng/mL)  None Detected  POC Buprenorphine (BUP) NONE DETECTED (Cut Off Level 10 ng/mL)  Positive !  POC Oxazepam (BZO) NONE DETECTED (Cut Off Level 300 ng/mL)  Positive !  POC Cocaine UR NONE DETECTED (Cut Off Level 300 ng/mL)  Positive !  POC Methamphetamine UR NONE DETECTED (Cut Off Level 1000 ng/mL)  Positive !  POC Morphine NONE DETECTED (Cut Off Level 300 ng/mL)   None Detected  POC Oxycodone UR NONE DETECTED (Cut Off Level 100 ng/mL)  Positive !  POC Methadone UR NONE DETECTED (Cut Off Level 300 ng/mL)  None Detected  POC Marijuana UR NONE DETECTED (Cut Off Level 50 ng/mL)  Positive !  !: Data is abnormal  Treatment Plan: Trazodone/ Prazosin trial for sleep Take 60 mg Prozac per Rx history pending Genesight study Genesight ?Seroquel/Zyprexa add on/in place of Prozac FU Monday  Hailey Morn, PA-CPatient ID: Hailey Perez, female   DOB: [birth year], 48 y.o.   MRN: [id number]

## 2023-07-04 ENCOUNTER — Telehealth (HOSPITAL_COMMUNITY): Payer: Self-pay | Admitting: Licensed Clinical Social Worker

## 2023-07-04 ENCOUNTER — Ambulatory Visit (HOSPITAL_COMMUNITY): Payer: 59

## 2023-07-04 ENCOUNTER — Ambulatory Visit (HOSPITAL_COMMUNITY)
Admission: EM | Admit: 2023-07-04 | Discharge: 2023-07-05 | Disposition: A | Discharge status: Other Acute Inpatient Hospital | Payer: 59 | Attending: Internal Medicine | Admitting: Internal Medicine

## 2023-07-04 DIAGNOSIS — F1120 Opioid dependence, uncomplicated: Secondary | ICD-10-CM | POA: Insufficient documentation

## 2023-07-04 DIAGNOSIS — R9431 Abnormal electrocardiogram [ECG] [EKG]: Secondary | ICD-10-CM | POA: Insufficient documentation

## 2023-07-04 DIAGNOSIS — R456 Violent behavior: Secondary | ICD-10-CM | POA: Diagnosis not present

## 2023-07-04 DIAGNOSIS — R4689 Other symptoms and signs involving appearance and behavior: Secondary | ICD-10-CM

## 2023-07-04 DIAGNOSIS — Z560 Unemployment, unspecified: Secondary | ICD-10-CM | POA: Diagnosis not present

## 2023-07-04 DIAGNOSIS — F3110 Bipolar disorder, current episode manic without psychotic features, unspecified: Secondary | ICD-10-CM | POA: Diagnosis present

## 2023-07-04 DIAGNOSIS — F250 Schizoaffective disorder, bipolar type: Secondary | ICD-10-CM | POA: Diagnosis not present

## 2023-07-04 DIAGNOSIS — G4709 Other insomnia: Secondary | ICD-10-CM | POA: Insufficient documentation
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
## 2023-07-05 DIAGNOSIS — R4689 Other symptoms and signs involving appearance and behavior: Secondary | ICD-10-CM | POA: Diagnosis not present

## 2023-07-05 DIAGNOSIS — F250 Schizoaffective disorder, bipolar type: Secondary | ICD-10-CM

## 2023-07-05 DIAGNOSIS — Z560 Unemployment, unspecified: Secondary | ICD-10-CM | POA: Diagnosis not present

## 2023-07-05 DIAGNOSIS — G4709 Other insomnia: Secondary | ICD-10-CM | POA: Diagnosis not present

## 2023-07-05 DIAGNOSIS — R456 Violent behavior: Secondary | ICD-10-CM | POA: Diagnosis not present

## 2023-07-05 DIAGNOSIS — F3110 Bipolar disorder, current episode manic without psychotic features, unspecified: Secondary | ICD-10-CM | POA: Diagnosis present

## 2023-07-05 DIAGNOSIS — R9431 Abnormal electrocardiogram [ECG] [EKG]: Secondary | ICD-10-CM | POA: Diagnosis not present

## 2023-07-05 DIAGNOSIS — F1120 Opioid dependence, uncomplicated: Secondary | ICD-10-CM | POA: Diagnosis not present

## 2023-07-05 LAB — POCT URINE DRUG SCREEN - MANUAL ENTRY (I-SCREEN)
POC Amphetamine UR: NOT DETECTED
POC Buprenorphine (BUP): POSITIVE — AB
POC Cocaine UR: NOT DETECTED
POC Marijuana UR: POSITIVE — AB
POC Methadone UR: NOT DETECTED
POC Methamphetamine UR: NOT DETECTED
POC Morphine: NOT DETECTED
POC Oxazepam (BZO): POSITIVE — AB
POC Oxycodone UR: POSITIVE — AB
POC Secobarbital (BAR): NOT DETECTED

## 2023-07-05 LAB — CBC WITH DIFFERENTIAL/PLATELET
Abs Immature Granulocytes: 0.02 10*3/uL (ref 0.00–0.07)
Basophils Absolute: 0 10*3/uL (ref 0.0–0.1)
Basophils Relative: 1 %
Eosinophils Absolute: 0.1 10*3/uL (ref 0.0–0.5)
Eosinophils Relative: 1 %
HCT: 38.9 % (ref 36.0–46.0)
Hemoglobin: 13.1 g/dL (ref 12.0–15.0)
Immature Granulocytes: 0 %
Lymphocytes Relative: 30 %
Lymphs Abs: 2.4 10*3/uL (ref 0.7–4.0)
MCH: 28.1 pg (ref 26.0–34.0)
MCHC: 33.7 g/dL (ref 30.0–36.0)
MCV: 83.3 fL (ref 80.0–100.0)
Monocytes Absolute: 0.9 10*3/uL (ref 0.1–1.0)
Monocytes Relative: 11 %
Neutro Abs: 4.5 10*3/uL (ref 1.7–7.7)
Neutrophils Relative %: 57 %
Platelets: 225 10*3/uL (ref 150–400)
RBC: 4.67 MIL/uL (ref 3.87–5.11)
RDW: 13 % (ref 11.5–15.5)
WBC: 7.9 10*3/uL (ref 4.0–10.5)
nRBC: 0 % (ref 0.0–0.2)

## 2023-07-05 LAB — COMPREHENSIVE METABOLIC PANEL
ALT: 15 U/L (ref 0–44)
AST: 22 U/L (ref 15–41)
Albumin: 4.2 g/dL (ref 3.5–5.0)
Alkaline Phosphatase: 61 U/L (ref 38–126)
Anion gap: 11 (ref 5–15)
BUN: 16 mg/dL (ref 6–20)
CO2: 22 mmol/L (ref 22–32)
Calcium: 9.6 mg/dL (ref 8.9–10.3)
Chloride: 103 mmol/L (ref 98–111)
Creatinine, Ser: 1.08 mg/dL — ABNORMAL HIGH (ref 0.44–1.00)
GFR, Estimated: >60 mL/min (ref >60)
Glucose, Bld: 102 mg/dL — ABNORMAL HIGH (ref 70–99)
Potassium: 3.9 mmol/L (ref 3.5–5.1)
Sodium: 136 mmol/L (ref 135–145)
Total Bilirubin: 0.6 mg/dL (ref 0.0–1.2)
Total Protein: 7.2 g/dL (ref 6.5–8.1)

## 2023-07-05 LAB — POCT PREGNANCY, URINE: Preg Test, Ur: NEGATIVE

## 2023-07-05 LAB — POC URINE PREG, ED: Preg Test, Ur: NEGATIVE

## 2023-07-05 LAB — TSH: TSH: 2.275 u[IU]/mL (ref 0.350–4.500)

## 2023-07-05 LAB — ETHANOL: Alcohol, Ethyl (B): <10 mg/dL (ref <10)

## 2023-07-05 MED ORDER — BUPRENORPHINE HCL-NALOXONE HCL 8-2 MG SL SUBL
1.0000 | SUBLINGUAL_TABLET | Freq: Every day | SUBLINGUAL | Status: DC
  Administered 2023-07-05: 1 via SUBLINGUAL
  Filled 2023-07-05: qty 1

## 2023-07-05 MED ORDER — OLANZAPINE 10 MG IM SOLR
5.0000 mg | Freq: Three times a day (TID) | INTRAMUSCULAR | Status: DC | PRN
  Administered 2023-07-05: 5 mg via INTRAMUSCULAR
  Filled 2023-07-05: qty 10

## 2023-07-05 MED ORDER — OLANZAPINE 10 MG IM SOLR: 10.0000 mg | Freq: Three times a day (TID) | INTRAMUSCULAR | Status: DC | PRN

## 2023-07-05 MED ORDER — OLANZAPINE 5 MG PO TBDP: 5.0000 mg | ORAL_TABLET | Freq: Three times a day (TID) | ORAL | Status: AC | PRN

## 2023-07-05 MED ORDER — ALUM & MAG HYDROXIDE-SIMETH 200-200-20 MG/5ML PO SUSP: 30.0000 mL | ORAL | Status: DC | PRN

## 2023-07-05 MED ORDER — MAGNESIUM HYDROXIDE 400 MG/5ML PO SUSP: 30.0000 mL | Freq: Every day | ORAL | Status: DC | PRN

## 2023-07-05 MED ORDER — OLANZAPINE 5 MG PO TBDP
5.0000 mg | ORAL_TABLET | Freq: Three times a day (TID) | ORAL | Status: DC | PRN
  Administered 2023-07-05: 5 mg via ORAL
  Filled 2023-07-05: qty 1

## 2023-07-05 MED ORDER — OLANZAPINE 5 MG PO TABS: 5.0000 mg | ORAL_TABLET | Freq: Two times a day (BID) | ORAL | 2 refills | Status: AC

## 2023-07-05 MED ORDER — ACETAMINOPHEN 325 MG PO TABS: 650.0000 mg | ORAL_TABLET | Freq: Four times a day (QID) | ORAL | Status: DC | PRN

## 2023-07-05 MED ORDER — CLONIDINE HCL 0.1 MG PO TABS
0.1000 mg | ORAL_TABLET | Freq: Three times a day (TID) | ORAL | Status: DC | PRN
  Administered 2023-07-05: 0.1 mg via ORAL
  Filled 2023-07-05: qty 1

## 2023-07-05 NOTE — ED Notes (Signed)
Patient was asked if she wanted breakfast. I gave her two options for breakfast which was oatmeal or cereal and she said she would like "coke" I said I'm sorry we only have two things I can offer. She said "I don't want anything then"
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
## 2023-07-05 NOTE — ED Notes (Signed)
Received a call from Cat at [facility] to obtain information for possible admission. Cat would like to be notified when patients B/P and pulse are lower, pulse in double digits.

## 2023-07-05 NOTE — ED Notes (Signed)
Patient is lying in bed requesting to be given whatever medications other patients have been given. Patient appears to be in no acute distress. This nurse will continue to monitor patient for safety.,
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
## 2023-07-05 NOTE — ED Notes (Signed)
Patient is on the unit yelling and cussing at staff. Patient is upset that the recliner does not go all the way down. This nurse and Tammy Sours, LPN offered the patient Zyprexa to help her calm down, patient states she will only take her subutec this nurse advised patient there is not a current order for that medication at this time.
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
## 2023-07-05 NOTE — Progress Notes (Signed)
Pt has been accepted to [facility] TODAY 07/05/2023 Bed assignment: [facility] Every unit   Pt meets inpatient criteria per: Vernard Gambles NP  Attending Physician will be: Dr. Forrestine Him MD   Report can be called to: [phone]  Pt can arrive ASAP   Care Team Notified: [facility] Rona Ravens RN, Earl Gala LPN, Vernard Gambles NP  [place] Gaige Fussner LCSW-A   07/05/2023 11:53 AM
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
## 2023-07-05 NOTE — Discharge Instructions (Addendum)
Pt meets inpatient criteria per: Vernard Gambles NP   Attending Physician will be: Dr. Forrestine Him MD    Report can be called to: [phone]   Pt can arrive ASAP    Care Team Notified: [facility] Rona Ravens RN, Earl Gala LPN, Vernard Gambles NP

## 2023-07-05 NOTE — ED Notes (Addendum)
Patient is A&O with an animated facial expression. Patient preoccupied with Suboxone medication administration. Patient easy to redirect. Patient observed stating I can hear you to an unseen/ unheard force. Patient denies SI, HI, VH. Patient able to verbally contract for safety.

## 2023-07-05 NOTE — ED Notes (Signed)
Sheriff here to transport to [facility]. Pt a/o & ambulatory. Safety maintained.

## 2023-07-05 NOTE — Progress Notes (Signed)
LCSW Progress Note  [id number]   Hailey Perez  07/05/2023  10:52 AM  Description:   Inpatient Psychiatric Referral  Patient was recommended inpatient per Vernard Gambles NP. There are no available beds at [facility], per [facility] Rona Ravens RN. Patient was referred to the following out of network facilities:    Destination  Service Provider Address Phone Fax  [facility] Center-Adult [address] [place], [place] [address] [phone]  [facility] [address] [place] [address] [phone]  [facility] [address], [place] [address] [phone]  [facility] [place] [address], [place] [address] [phone]  [facility] [address] [place] [address] [phone]  [facility] [address] [place] [address] [phone]  [facility] [address] [place] [address] [phone]  [facility] EFAX [address] [place], [place] [phone]  [facility] [address] [place] [address] [phone]  [facility] [address], [place] [address] [phone]  [facility] [address] [place] [address] [phone]  [facility] [address] [place] [address] [phone]  [facility] [address], [place] [address] [phone]  [facility] [address], [place] [address] [phone]      Situation ongoing, CSW to continue following and update chart as more information becomes available.      [place] Bronwyn Belasco, MSW, LCSW  07/05/2023 10:52 AM

## 2023-07-05 NOTE — ED Notes (Signed)
[facility] [place] [facility] Sheriff called to transport pt. Will continue to monitor for safety and report any COC.

## 2023-07-06 ENCOUNTER — Ambulatory Visit (HOSPITAL_COMMUNITY): Payer: 59

## 2023-07-08 ENCOUNTER — Ambulatory Visit (HOSPITAL_COMMUNITY): Payer: 59

## 2023-07-11 ENCOUNTER — Encounter (HOSPITAL_COMMUNITY): Payer: Self-pay | Admitting: Medical

## 2023-07-11 ENCOUNTER — Ambulatory Visit (HOSPITAL_COMMUNITY): Payer: 59
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
## 2023-07-11 NOTE — Addendum Note (Signed)
Addended by: Court Joy on: 07/11/2023 03:39 PM   Modules accepted: Level of Service

## 2023-07-13 ENCOUNTER — Ambulatory Visit (HOSPITAL_COMMUNITY): Payer: 59

## 2023-07-15 ENCOUNTER — Ambulatory Visit (HOSPITAL_COMMUNITY): Payer: 59

## 2023-07-18 ENCOUNTER — Ambulatory Visit (HOSPITAL_COMMUNITY): Payer: 59

## 2023-07-20 ENCOUNTER — Ambulatory Visit (HOSPITAL_COMMUNITY): Payer: 59

## 2023-07-22 ENCOUNTER — Ambulatory Visit (HOSPITAL_COMMUNITY): Payer: 59

## 2023-07-25 ENCOUNTER — Ambulatory Visit (HOSPITAL_COMMUNITY): Payer: 59

## 2023-07-27 ENCOUNTER — Ambulatory Visit (HOSPITAL_COMMUNITY): Payer: 59

## 2023-07-29 ENCOUNTER — Ambulatory Visit (HOSPITAL_COMMUNITY): Payer: 59

## 2023-11-14 ENCOUNTER — Encounter: Payer: Self-pay | Admitting: Podiatry

## 2023-11-14 ENCOUNTER — Ambulatory Visit (INDEPENDENT_AMBULATORY_CARE_PROVIDER_SITE_OTHER): Payer: MEDICAID | Admitting: Podiatry

## 2023-11-14 ENCOUNTER — Ambulatory Visit (INDEPENDENT_AMBULATORY_CARE_PROVIDER_SITE_OTHER)

## 2023-11-14 DIAGNOSIS — M79672 Pain in left foot: Secondary | ICD-10-CM

## 2023-11-14 DIAGNOSIS — M2012 Hallux valgus (acquired), left foot: Secondary | ICD-10-CM

## 2023-11-14 NOTE — Progress Notes (Signed)
 Patient presents with complaint of a painful bunion on the left foot.  Says the second toe has been overriding the hallux and creating difficulty wearing shoes without discomfort.  Pain around the first metatarsal phalangeal joint of the left foot.  Says the bunion is minimal worse.  Says she has tried various shoes and padding with no relief..   Physical exam:  General appearance: Pleasant, and in no acute distress. AOx3.  Vascular: Pedal pulses: DP 2/4 bilaterally, PT 2/4 bilaterally.  No edema lower legs bilaterally. Capillary fill time immediate.  Neurological: Light touch intact feet bilaterally.  Normal Achilles reflex bilaterally.  No clonus or spasticity noted.   Dermatologic:   Skin normal temperature bilaterally.  Skin normal color, tone, and texture bilaterally.   Musculoskeletal: Severe hallux abductovalgus deformity left foot with hallux going under the second toe.  Pain over the first metatarsal phalangeal joint.  Good range of motion at the first MTP with no tenderness with range of motion. Radiographs: Left foot 3 views: Severe hallux abductovalgus deformity with greater than 20 degree intermetatarsal angle.  Second toe overlaps hallux.  Good bone density.  No evidence of arthrosis or bone tumors.  Diagnosis: 1.  Pain left foot. 2.  Painful severe hallux abductovalgus deformity left foot.  Plan: -New patient office visit level 3 for evaluation and management. - Discussed the severe bunion deformity.  Given that she is already tried changes and padding bilaterally Symptoms and the severity of the deformity and the fact that the hallux is starting to underlapped the second toe, would recommend surgical correction.  She will probably need a Lapidus type procedure or similar for correction of this deformity.   Return for surgical consult with Dr. Lydia Sams

## 2023-12-07 ENCOUNTER — Ambulatory Visit (INDEPENDENT_AMBULATORY_CARE_PROVIDER_SITE_OTHER): Admitting: Podiatry

## 2023-12-07 DIAGNOSIS — Z01818 Encounter for other preprocedural examination: Secondary | ICD-10-CM

## 2023-12-07 DIAGNOSIS — M2012 Hallux valgus (acquired), left foot: Secondary | ICD-10-CM

## 2023-12-07 NOTE — Progress Notes (Signed)
 Subjective:  Patient ID: Hailey Perez, female    DOB: 06/30/1975,  MRN: [id number]  Chief Complaint  Patient presents with   Bunions    Pt stated that she is here to talk about surgery     48 y.o. female presents with the above complaint.  Patient presents with left severe bunion deformity.  She is known to Dr. Gabriel who has been treating her conservatively for bunion deformity.  At this time patient states that she has tried shoe gear modification padding protecting off offloading she is here for surgical consult to discuss bunion surgery.  I discussed my preoperative entire postop plan with the patient extensive detail she states understand like to proceed with surgery.  Review of Systems: Negative except as noted in the HPI. Denies N/V/F/Ch.  Past Medical History:  Diagnosis Date   Substance abuse (HCC)     Current Outpatient Medications:    DENTA 5000 PLUS 1.1 % CREA dental cream, Place 1 Application onto teeth daily., Disp: , Rfl:    FLUoxetine  (PROZAC ) 20 MG capsule, Take 2 caps in am and 1 cap in pm, Disp: 90 capsule, Rfl: 2   naloxone  (NARCAN ) nasal spray 4 mg/0.1 mL, Place 1 spray into the nose once., Disp: , Rfl:    OLANZapine  (ZYPREXA ) 5 MG tablet, Take 1 tablet (5 mg total) by mouth in the morning and at bedtime., Disp: 30 tablet, Rfl: 2   OLANZapine  zydis (ZYPREXA ) 5 MG disintegrating tablet, Take 1 tablet (5 mg total) by mouth 3 (three) times daily as needed (mild agitation - confused, irritable, easily annoyed or angered, intolerant.)., Disp: , Rfl:    SUBOXONE  8-2 MG FILM, Place 1 Film under the tongue 3 (three) times daily., Disp: , Rfl:    traZODone  (DESYREL ) 50 MG tablet, Take 1 tablet (50 mg total) by mouth at bedtime as needed and may repeat dose one time if needed for sleep., Disp: 60 tablet, Rfl: 0  Social History   Tobacco Use  Smoking Status Every Day   Current packs/day: 0.50   Types: Cigarettes  Smokeless Tobacco Never    No Known  Allergies Objective:  There were no vitals filed for this visit. There is no height or weight on file to calculate BMI. Constitutional Well developed. Well nourished.  Vascular Dorsalis pedis pulses palpable bilaterally. Posterior tibial pulses palpable bilaterally. Capillary refill normal to all digits.  No cyanosis or clubbing noted. Pedal hair growth normal.  Neurologic Normal speech. Oriented to person, place, and time. Epicritic sensation to light touch grossly present bilaterally.  Dermatologic Nails well groomed and normal in appearance. No open wounds. No skin lesions.  Orthopedic: Normal joint ROM without pain or crepitus bilaterally. Hallux abductovalgus deformity present severe bunion deformity noted no intra-articular pain noted no crepitus noted no first MPJ arthritis noted Left 1st MPJ diminished range of motion. Left 1st TMT with gross hypermobility. Right 1st MPJ diminished range of motion  Right 1st TMT without gross hypermobility. Lesser digital contractures absent left.   Radiographs: Taken and reviewed. Hallux abductovalgus deformity present. Metatarsal parabola normal. 1st/2nd IMA: 17 degrees; TSP: 6 out of 7.  Metatarsal parabola within normal limits.  Assessment:   1. Acquired hallux valgus of left foot   2. Encounter for preoperative examination for general surgical procedure    Plan:  Patient was evaluated and treated and all questions answered.  Hallux abductovalgus deformity,  -XR as above. -Patient has failed all conservative therapy and wishes to proceed with surgical  intervention. All risks, benefits, and alternatives discussed with patient. No guarantees given. Consent reviewed and signed by patient. Post-op course explained at length. -Planned procedures: Lapidus bunionectomy left foot with possible phalangeal osteotomy -Risk factors: None - I discussed my preoperative postop plan with the patient extensive detail given the amount of pain that  she is experiencing in the setting of failing conservative care patient will benefit from Lapidus bunionectomy with possible phalangeal osteotomy -Informed surgical risk consent was reviewed and read aloud to the patient.  I reviewed the films.  I have discussed my findings with the patient in great detail.  I have discussed all risks including but not limited to infection, stiffness, scarring, limp, disability, deformity, damage to blood vessels and nerves, numbness, poor healing, need for braces, arthritis, chronic pain, amputation, death.  All benefits and realistic expectations discussed in great detail.  I have made no promises as to the outcome.  I have provided realistic expectations.  I have offered the patient a 2nd opinion, which they have declined and assured me they preferred to proceed despite the risks   No follow-ups on file.

## 2023-12-14 ENCOUNTER — Telehealth: Payer: Self-pay | Admitting: Podiatry

## 2023-12-14 NOTE — Telephone Encounter (Signed)
 Received surgical consent form  Left message for pt to call me to get her surgery scheduled

## 2024-01-19 ENCOUNTER — Encounter (HOSPITAL_COMMUNITY): Payer: Self-pay | Admitting: Emergency Medicine

## 2024-01-19 ENCOUNTER — Emergency Department (HOSPITAL_COMMUNITY): Admission: EM | Admit: 2024-01-19 | Discharge: 2024-01-19 | Discharge status: Against Medical Advice

## 2024-01-19 ENCOUNTER — Other Ambulatory Visit: Payer: Self-pay

## 2024-01-19 DIAGNOSIS — Z5321 Procedure and treatment not carried out due to patient leaving prior to being seen by health care provider: Secondary | ICD-10-CM | POA: Insufficient documentation

## 2024-01-19 DIAGNOSIS — T50901A Poisoning by unspecified drugs, medicaments and biological substances, accidental (unintentional), initial encounter: Secondary | ICD-10-CM | POA: Insufficient documentation

## 2024-01-19 DIAGNOSIS — R410 Disorientation, unspecified: Secondary | ICD-10-CM | POA: Diagnosis present

## 2024-01-19 NOTE — ED Provider Triage Note (Signed)
 Emergency Medicine Provider Triage Evaluation Note  Hailey Perez , a 48 y.o. female  was evaluated in triage.  Patient brought in by EMS after accidental overdose after taking what she thought was a tablet of Xanax from a friend because she was feeling stressed this morning.  When EMS arrived on scene patient was somewhat confused with sats of 80 to 85% and then patient woke up without any medication.  Patient is awake and alert upon arrival.  Reports that she did not take this medication in attempt to harm herself and has no thoughts of harming others was just feeling stressed and wanted to be able to calm down.  She does not wish to stay for further evaluation.  And is not willing to answer additional history questions.   Physical Exam  BP (!) 163/105 (BP Location: Left Arm)   Pulse (!) 154   Temp 98.7 F (37.1 C) (Oral)   Resp (!) 22   SpO2 95%  Gen:   Awake, awake alert and oriented x 4, appears anxious Resp:  Normal effort, satting at 95% on room air, speaking in full sentences. Cardiac: Heart rate in the 140s-150s with regular rhythm MSK:   Moves extremities without difficulty, ambulating in the room without difficulty   Medical Decision Making  Medically screening exam initiated at 5:40 PM.  Appropriate orders placed.  Hailey Perez was informed that the remainder of the evaluation will be completed by another provider, this initial triage assessment does not replace that evaluation, and the importance of remaining in the ED until their evaluation is complete.  I informed patient that given her heart rate and that we are unsure what she took this morning we would recommend staying for further evaluation I explained the risks of leaving prior to further evaluation including significant illness, disability or death, patient expresses understanding and accepts these risks but does not wish to stay for any further evaluation and will sign out AGAINST MEDICAL ADVICE.   Hailey Larraine FALCON,  PA-C 01/19/24 1743

## 2024-01-19 NOTE — ED Triage Notes (Signed)
 Patient BIB EMS from home c/o drug overdose. Per report patient was lethargic ,confuse and 80-85% on RA upon EMS arrival. EMS started 4L [place]. Patient report taking xanax before she passed out. Patient report nausea, denies vomiting. Patient more awake upon arrival. Patient able to answer questions.

## 2024-01-19 NOTE — ED Notes (Signed)
 Patient left and signed AMA.

## 2024-01-20 ENCOUNTER — Telehealth: Payer: Self-pay | Admitting: Podiatry

## 2024-01-20 NOTE — Telephone Encounter (Signed)
 DOS- 01/23/2024  AIKEN OSTEOTOMY W/ POP BLOCK LT- 71689 LAPIDUS PROCEDURE INCLUDING BUNIONECTOMY LT- 71702  [facility] EFFECTIVE DATE- 09/06/2022  DEDUCTIBLE- $4700 ACCUMULATED- $4700 OOP- $9100 ACCUMULATED- $1812.12 COINSURANCE- 50% AFTER DEDUCTIBLE REF# [id number]  PER ORLY C AND MARK R WITH [facility], NO PRIOR AUTHS ARE REQUIRED FOR CPT CODES 71689 AND [phone]. REF# [id number]  PER [facility] [facility], NO PRIOR AUTHS ARE REQUIRED FOR CPT CODES 71689 AND [phone]. [employer] ON FILE IS NO LONGER EFFECTIVE.

## 2024-01-23 ENCOUNTER — Other Ambulatory Visit: Payer: Self-pay | Admitting: Podiatry

## 2024-01-23 DIAGNOSIS — M2012 Hallux valgus (acquired), left foot: Secondary | ICD-10-CM | POA: Diagnosis not present

## 2024-01-23 MED ORDER — IBUPROFEN 800 MG PO TABS: 800.0000 mg | ORAL_TABLET | Freq: Four times a day (QID) | ORAL | 1 refills | Status: DC | PRN

## 2024-01-23 MED ORDER — OXYCODONE-ACETAMINOPHEN 5-325 MG PO TABS: 1.0000 | ORAL_TABLET | ORAL | 0 refills | Status: DC | PRN

## 2024-01-28 ENCOUNTER — Encounter (HOSPITAL_COMMUNITY): Payer: Self-pay

## 2024-01-28 ENCOUNTER — Emergency Department (HOSPITAL_COMMUNITY)
Admission: EM | Admit: 2024-01-28 | Discharge: 2024-01-28 | Disposition: A | Discharge status: Home / Self Care | Attending: Emergency Medicine | Admitting: Emergency Medicine

## 2024-01-28 DIAGNOSIS — R42 Dizziness and giddiness: Secondary | ICD-10-CM | POA: Diagnosis not present

## 2024-01-28 DIAGNOSIS — T50901A Poisoning by unspecified drugs, medicaments and biological substances, accidental (unintentional), initial encounter: Secondary | ICD-10-CM | POA: Diagnosis not present

## 2024-01-28 DIAGNOSIS — R110 Nausea: Secondary | ICD-10-CM | POA: Diagnosis present

## 2024-01-28 LAB — COMPREHENSIVE METABOLIC PANEL WITH GFR
ALT: 17 U/L (ref 0–44)
AST: 18 U/L (ref 15–41)
Albumin: 3.3 g/dL — ABNORMAL LOW (ref 3.5–5.0)
Alkaline Phosphatase: 74 U/L (ref 38–126)
Anion gap: 8 (ref 5–15)
BUN: 16 mg/dL (ref 6–20)
CO2: 29 mmol/L (ref 22–32)
Calcium: 8.9 mg/dL (ref 8.9–10.3)
Chloride: 99 mmol/L (ref 98–111)
Creatinine, Ser: 0.8 mg/dL (ref 0.44–1.00)
GFR, Estimated: >60 mL/min (ref >60)
Glucose, Bld: 101 mg/dL — ABNORMAL HIGH (ref 70–99)
Potassium: 4.4 mmol/L (ref 3.5–5.1)
Sodium: 136 mmol/L (ref 135–145)
Total Bilirubin: 0.4 mg/dL (ref 0.0–1.2)
Total Protein: 6.8 g/dL (ref 6.5–8.1)

## 2024-01-28 LAB — RAPID URINE DRUG SCREEN, HOSP PERFORMED
Amphetamines: POSITIVE — AB
Barbiturates: NOT DETECTED
Benzodiazepines: NOT DETECTED
Cocaine: NOT DETECTED
Opiates: NOT DETECTED
Tetrahydrocannabinol: NOT DETECTED

## 2024-01-28 LAB — CBC
HCT: 36.5 % (ref 36.0–46.0)
Hemoglobin: 11.5 g/dL — ABNORMAL LOW (ref 12.0–15.0)
MCH: 26.7 pg (ref 26.0–34.0)
MCHC: 31.5 g/dL (ref 30.0–36.0)
MCV: 84.9 fL (ref 80.0–100.0)
Platelets: 154 K/uL (ref 150–400)
RBC: 4.3 MIL/uL (ref 3.87–5.11)
RDW: 13.3 % (ref 11.5–15.5)
WBC: 5.5 K/uL (ref 4.0–10.5)
nRBC: 0 % (ref 0.0–0.2)

## 2024-01-28 LAB — ETHANOL: Alcohol, Ethyl (B): <15 mg/dL (ref <15)

## 2024-01-28 NOTE — ED Provider Notes (Signed)
  EMERGENCY DEPARTMENT AT [facility] Provider Note   CSN: [id number] Arrival date & time: 01/28/24  1555     Patient presents with: Weakness and Dizziness   Hailey Perez is a 48 y.o. female.    Weakness Associated symptoms: dizziness   Dizziness Associated symptoms: weakness   Patient presented with mental status change.  Reportedly weak and dizzy.  Reportedly nausea.  Had recent toenail procedure and started on Percocet.  Felt more dizzy and less responsive today.  States she thinks it may have been because she taken her medicines.  States she has taken 2 pills of it today.  Also was on Suboxone  at baseline.  Had previously been seen for possible overuse of her Xanax.  States she has not been taking     Prior to Admission medications   Medication Sig Start Date End Date Taking? Authorizing Provider  DENTA 5000 PLUS 1.1 % CREA dental cream Place 1 Application onto teeth daily. 06/20/23   [provider]  FLUoxetine  (PROZAC ) 20 MG capsule Take 2 caps in am and 1 cap in pm 06/20/23   Kober, Charles E, PA-C  ibuprofen  (ADVIL ) 800 MG tablet Take 1 tablet (800 mg total) by mouth every 6 (six) hours as needed. 01/23/24   Tobie Franky SQUIBB, DPM  naloxone  (NARCAN ) nasal spray 4 mg/0.1 mL Place 1 spray into the nose once. 06/20/23   [provider]  OLANZapine  (ZYPREXA ) 5 MG tablet Take 1 tablet (5 mg total) by mouth in the morning and at bedtime. 07/05/23 07/04/24  Mardy Elveria DEL, NP  OLANZapine  zydis (ZYPREXA ) 5 MG disintegrating tablet Take 1 tablet (5 mg total) by mouth 3 (three) times daily as needed (mild agitation - confused, irritable, easily annoyed or angered, intolerant.). 07/05/23   Mardy Elveria DEL, NP  SUBOXONE  8-2 MG FILM Place 1 Film under the tongue 3 (three) times daily.    [provider]  traZODone  (DESYREL ) 50 MG tablet Take 1 tablet (50 mg total) by mouth at bedtime as needed and may repeat dose one time if needed for sleep.  07/01/23 07/31/23  Kober, Charles E, PA-C    Allergies: Patient has no known allergies.    Review of Systems  Neurological:  Positive for dizziness and weakness.    Updated Vital Signs BP 101/66   Pulse 95   Temp 97.9 F (36.6 C) (Oral)   Resp 14   Ht 5' 1 (1.549 m)   Wt 74.8 kg   SpO2 97%   BMI 31.18 kg/m   Physical Exam Vitals and nursing note reviewed.  Cardiovascular:     Rate and Rhythm: Regular rhythm.  Musculoskeletal:     Comments: Left foot in a boot.  Neurological:     Mental Status: She is alert.     (all labs ordered are listed, but only abnormal results are displayed) Labs Reviewed  RAPID URINE DRUG SCREEN, HOSP PERFORMED - Abnormal; Notable for the following components:      Result Value   Amphetamines POSITIVE (*)    All other components within normal limits  CBC - Abnormal; Notable for the following components:   Hemoglobin 11.5 (*)    All other components within normal limits  COMPREHENSIVE METABOLIC PANEL WITH GFR - Abnormal; Notable for the following components:   Glucose, Bld 101 (*)    Albumin 3.3 (*)    All other components within normal limits  ETHANOL    EKG: None  Radiology: No results  found.   Procedures   Medications Ordered in the ED - No data to display                                  Medical Decision Making Amount and/or Complexity of Data Reviewed Labs: ordered.   Patient's with mental status change.  Now better.  Potentially could be due to overuse of medication.  States she was taking medication as prescribed.  However is on both Percocet now and her Suboxone .  Mental status continues to improve.  UDS positive for amphetamines which she is prescribed.  Appears stable for discharge home at this time however.  Blood work reassuring.     Final diagnoses:  Accidental overdose, initial encounter    ED Discharge Orders     None          [name], MD 01/28/24 2014

## 2024-01-28 NOTE — Discharge Instructions (Addendum)
 I would not take more of the oxycodone .  It may have been too much for you combined with the Suboxone  you are already on.

## 2024-01-28 NOTE — ED Triage Notes (Signed)
 Pt BIB ems for weakness and dizziness. Mother found pt today and tried to wake her up, she was hard to awake. Pt states having more weakness, dizziness,and nausea. Had a toenail removal procedure recently and is on percocet. Also on suboxone . Hx of drug use. EMS checked pill bottles, all pills were still there. Pt states she did not abuse her prescription pills. No LOC, falls, Dm. A&O x4, VS stable

## 2024-01-30 ENCOUNTER — Telehealth: Payer: Self-pay | Admitting: Podiatry

## 2024-01-30 NOTE — Telephone Encounter (Signed)
 Pt left message Friday 8/22 @ 1006am stating she had surgery on Monday and got crutches but thinks they are too small.  She was asking about exchanging for correct size.   Left message for pt that she would need to contact the surgical center as they are the ones that provided the crutches as we do not carry them in our office.

## 2024-02-01 ENCOUNTER — Ambulatory Visit (INDEPENDENT_AMBULATORY_CARE_PROVIDER_SITE_OTHER): Admitting: Podiatry

## 2024-02-01 ENCOUNTER — Ambulatory Visit (INDEPENDENT_AMBULATORY_CARE_PROVIDER_SITE_OTHER)

## 2024-02-01 DIAGNOSIS — M2012 Hallux valgus (acquired), left foot: Secondary | ICD-10-CM

## 2024-02-01 DIAGNOSIS — R2689 Other abnormalities of gait and mobility: Secondary | ICD-10-CM

## 2024-02-01 NOTE — Progress Notes (Signed)
  Subjective:  Patient ID: Hailey Perez, female    DOB: 1975/12/06,  MRN: [id number]  Chief Complaint  Patient presents with   Post-op Follow-up    Wearing cam boot, no crutches because she left them at home. Surgical sites have no redness. Some swelling present and patient reports pain began decreasing a couple of days ago.     DOS: 01/23/2024 Procedure: Left Lapidus bunionectomy with phalangeal osteotomy  48 y.o. female returns for post-op check.  Patient states that she is doing well.  Bandages are clean dry and intact.  Patient is noncompliant with the nonweightbearing status  Review of Systems: Negative except as noted in the HPI. Denies N/V/F/Ch.  Past Medical History:  Diagnosis Date   Substance abuse (HCC)     Current Outpatient Medications:    DENTA 5000 PLUS 1.1 % CREA dental cream, Place 1 Application onto teeth daily., Disp: , Rfl:    FLUoxetine  (PROZAC ) 20 MG capsule, Take 2 caps in am and 1 cap in pm, Disp: 90 capsule, Rfl: 2   ibuprofen  (ADVIL ) 800 MG tablet, Take 1 tablet (800 mg total) by mouth every 6 (six) hours as needed., Disp: 60 tablet, Rfl: 1   naloxone  (NARCAN ) nasal spray 4 mg/0.1 mL, Place 1 spray into the nose once., Disp: , Rfl:    OLANZapine  (ZYPREXA ) 5 MG tablet, Take 1 tablet (5 mg total) by mouth in the morning and at bedtime., Disp: 30 tablet, Rfl: 2   OLANZapine  zydis (ZYPREXA ) 5 MG disintegrating tablet, Take 1 tablet (5 mg total) by mouth 3 (three) times daily as needed (mild agitation - confused, irritable, easily annoyed or angered, intolerant.)., Disp: , Rfl:    SUBOXONE  8-2 MG FILM, Place 1 Film under the tongue 3 (three) times daily., Disp: , Rfl:    traZODone  (DESYREL ) 50 MG tablet, Take 1 tablet (50 mg total) by mouth at bedtime as needed and may repeat dose one time if needed for sleep., Disp: 60 tablet, Rfl: 0  Social History   Tobacco Use  Smoking Status Every Day   Current packs/day: 0.50   Types: Cigarettes  Smokeless Tobacco Never     No Known Allergies Objective:  There were no vitals filed for this visit. There is no height or weight on file to calculate BMI. Constitutional Well developed. Well nourished.  Vascular Foot warm and well perfused. Capillary refill normal to all digits.   Neurologic Normal speech. Oriented to person, place, and time. Epicritic sensation to light touch grossly present bilaterally.  Dermatologic Skin healing well without signs of infection. Skin edges well coapted without signs of infection.  Orthopedic: Tenderness to palpation noted about the surgical site.   Radiographs:  3 views of skeletally mature the left foot: Hardware is intact no signs of backing or loosening noted.  Reduction of bunion deformity noted with reduction of hallux interphalangeus noted. Assessment:   1. Acquired hallux valgus of left foot   2. Antalgic gait    Plan:  Patient was evaluated and treated and all questions answered.  S/p foot surgery left -Progressing as expected post-operatively. -XR: See above -WB Status: Nonweightbearing in left lower extremity in cam boot discussed with her to maintain nonweightbearing status she states understanding -Sutures: Intact.  No clinical signs of dehiscence noted no complication noted -Medications: None -Foot redressed.  No follow-ups on file.

## 2024-02-09 ENCOUNTER — Other Ambulatory Visit: Payer: Self-pay | Admitting: Podiatry

## 2024-02-15 ENCOUNTER — Ambulatory Visit (INDEPENDENT_AMBULATORY_CARE_PROVIDER_SITE_OTHER): Admitting: Podiatry

## 2024-02-15 DIAGNOSIS — Z9889 Other specified postprocedural states: Secondary | ICD-10-CM

## 2024-02-15 DIAGNOSIS — M2012 Hallux valgus (acquired), left foot: Secondary | ICD-10-CM

## 2024-02-15 NOTE — Progress Notes (Signed)
  Subjective:  Patient ID: Hailey Perez, female    DOB: [birth year],  MRN: [id number]  Chief Complaint  Patient presents with   Routine Post Op    POV # 2 DOS 01/23/24 LT LAPIDUS BUNIONECTOMY W/ PHALANGEAL OSTEOTOMY W/ FIXATION    DOS: 01/23/2024 Procedure: Left Lapidus bunionectomy with phalangeal osteotomy  48 y.o. female returns for post-op check.  Patient states that she is doing well.  Bandages are clean dry and intact.  Patient is noncompliant with the nonweightbearing status  Review of Systems: Negative except as noted in the HPI. Denies N/V/F/Ch.  Past Medical History:  Diagnosis Date   Substance abuse (HCC)     Current Outpatient Medications:    DENTA 5000 PLUS 1.1 % CREA dental cream, Place 1 Application onto teeth daily., Disp: , Rfl:    FLUoxetine  (PROZAC ) 20 MG capsule, Take 2 caps in am and 1 cap in pm, Disp: 90 capsule, Rfl: 2   ibuprofen  (ADVIL ) 800 MG tablet, TAKE ONE TABLET BY MOUTH EVERY 6 HOURS AS NEEDED, Disp: 60 tablet, Rfl: 1   naloxone  (NARCAN ) nasal spray 4 mg/0.1 mL, Place 1 spray into the nose once., Disp: , Rfl:    OLANZapine  (ZYPREXA ) 5 MG tablet, Take 1 tablet (5 mg total) by mouth in the morning and at bedtime., Disp: 30 tablet, Rfl: 2   OLANZapine  zydis (ZYPREXA ) 5 MG disintegrating tablet, Take 1 tablet (5 mg total) by mouth 3 (three) times daily as needed (mild agitation - confused, irritable, easily annoyed or angered, intolerant.)., Disp: , Rfl:    SUBOXONE  8-2 MG FILM, Place 1 Film under the tongue 3 (three) times daily., Disp: , Rfl:    traZODone  (DESYREL ) 50 MG tablet, Take 1 tablet (50 mg total) by mouth at bedtime as needed and may repeat dose one time if needed for sleep., Disp: 60 tablet, Rfl: 0  Social History   Tobacco Use  Smoking Status Every Day   Current packs/day: 0.50   Types: Cigarettes  Smokeless Tobacco Never    No Known Allergies Objective:  There were no vitals filed for this visit. There is no height or weight on file  to calculate BMI. Constitutional Well developed. Well nourished.  Vascular Foot warm and well perfused. Capillary refill normal to all digits.   Neurologic Normal speech. Oriented to person, place, and time. Epicritic sensation to light touch grossly present bilaterally.  Dermatologic Skin completely epithelialized good range of motion noted at the first metatarsophalangeal joint.  Incision has completely healed up.  Good reduction of bunion deformity noted  Orthopedic: No further tenderness to palpation noted about the surgical site.   Radiographs:  3 views of skeletally mature the left foot: Hardware is intact no signs of backing or loosening noted.  Reduction of bunion deformity noted with reduction of hallux interphalangeus noted. Assessment:   1. Acquired hallux valgus of left foot   2. S/P foot surgery     Plan:  Patient was evaluated and treated and all questions answered.  S/p foot surgery left -Progressing as expected post-operatively. -XR: See above -WB Status: Weightbearing as tolerated in regular shoe -Sutures: Intact.  No clinical signs of dehiscence noted no complication noted -Medications: None - Patient has already started weightbearing as tolerated in cam boot earlier than discussed.  I discussed with her that slowly transition to regular shoes at this time she states understanding.  No follow-ups on file.

## 2024-04-12 ENCOUNTER — Other Ambulatory Visit: Payer: Self-pay | Admitting: Podiatry

## 2024-04-18 ENCOUNTER — Ambulatory Visit: Admitting: Podiatry
